# Patient Record
Sex: Female | Born: 1960 | Race: White | Hispanic: No | Marital: Married | Smoking: Never smoker
Health system: Southern US, Community
[De-identification: ages and names within clinical notes are randomized; demographics above are authoritative.]

## PROBLEM LIST (undated history)

## (undated) DIAGNOSIS — R112 Nausea with vomiting, unspecified: Secondary | ICD-10-CM

## (undated) DIAGNOSIS — Z8669 Personal history of other diseases of the nervous system and sense organs: Secondary | ICD-10-CM

## (undated) DIAGNOSIS — D689 Coagulation defect, unspecified: Secondary | ICD-10-CM

## (undated) DIAGNOSIS — D219 Benign neoplasm of connective and other soft tissue, unspecified: Secondary | ICD-10-CM

## (undated) DIAGNOSIS — I499 Cardiac arrhythmia, unspecified: Secondary | ICD-10-CM

## (undated) DIAGNOSIS — M329 Systemic lupus erythematosus, unspecified: Secondary | ICD-10-CM

## (undated) DIAGNOSIS — Z9889 Other specified postprocedural states: Secondary | ICD-10-CM

## (undated) DIAGNOSIS — M069 Rheumatoid arthritis, unspecified: Secondary | ICD-10-CM

## (undated) DIAGNOSIS — M79643 Pain in unspecified hand: Secondary | ICD-10-CM

## (undated) DIAGNOSIS — M359 Systemic involvement of connective tissue, unspecified: Secondary | ICD-10-CM

## (undated) DIAGNOSIS — R760 Raised antibody titer: Secondary | ICD-10-CM

## (undated) HISTORY — PX: ABDOMINAL HYSTERECTOMY: SHX81

## (undated) HISTORY — DX: Other specified postprocedural states: R11.2

## (undated) HISTORY — DX: Personal history of other diseases of the nervous system and sense organs: Z86.69

## (undated) HISTORY — DX: Cardiac arrhythmia, unspecified: I49.9

## (undated) HISTORY — PX: REDUCTION MAMMAPLASTY: SUR839

## (undated) HISTORY — DX: Other specified postprocedural states: Z98.890

## (undated) HISTORY — DX: Benign neoplasm of connective and other soft tissue, unspecified: D21.9

## (undated) HISTORY — PX: BUTTOCK MASS EXCISION: SHX1279

## (undated) HISTORY — PX: AUGMENTATION MAMMAPLASTY: SUR837

## (undated) HISTORY — PX: PELVIC LAPAROSCOPY: SHX162

## (undated) HISTORY — DX: Pain in unspecified hand: M79.643

## (undated) HISTORY — PX: BREAST SURGERY: SHX581

## (undated) HISTORY — DX: Systemic involvement of connective tissue, unspecified: M35.9

## (undated) HISTORY — DX: Coagulation defect, unspecified: D68.9

## (undated) HISTORY — DX: Raised antibody titer: R76.0

## (undated) HISTORY — DX: Rheumatoid arthritis, unspecified: M06.9

## (undated) HISTORY — DX: Systemic lupus erythematosus, unspecified: M32.9

---

## 2007-08-21 ENCOUNTER — Encounter: Admission: RE | Admit: 2007-08-21 | Discharge: 2007-08-21 | Payer: Self-pay | Admitting: Obstetrics and Gynecology

## 2007-08-21 IMAGING — MG MM DIAGNOSTIC BILATERAL
9 series · 9 of 9 positions shown · non-contrast
Comparison: none

DG DIAGNOSTIC BILATERAL
Bilateral CC and MLO view(s) were taken.

BILATERAL BREAST ULTRASOUND
Technologist: [facility], Medical
DIGITAL BILATERAL DIAGNOSTIC MAMMOGRAM WITH CAD AND BILATERAL BREAST ULTRASOUND:
CLINICAL DATA: Lateral left breast pain.

[R CC]
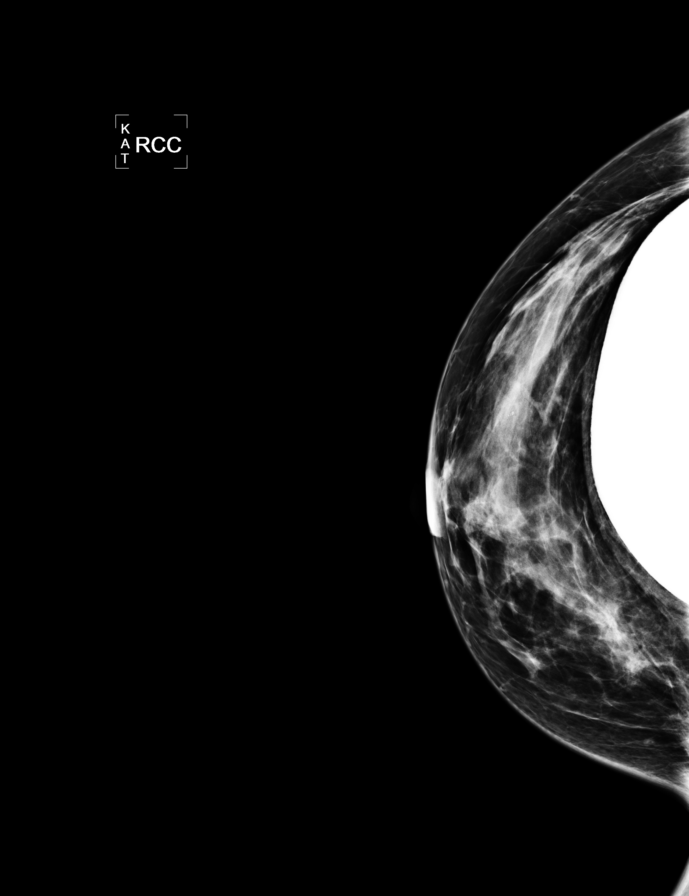

[L CC]
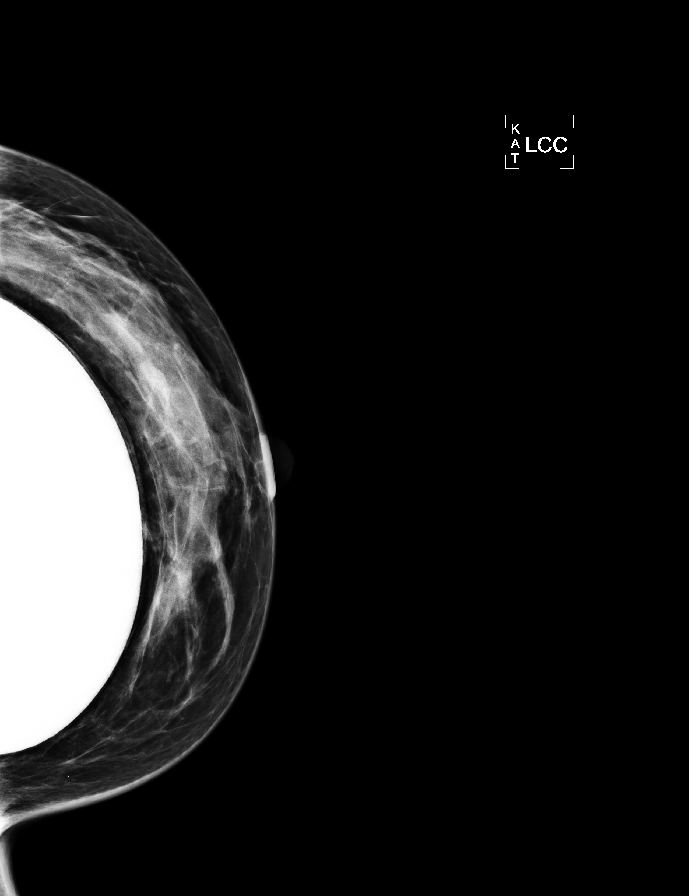

[L MLO]
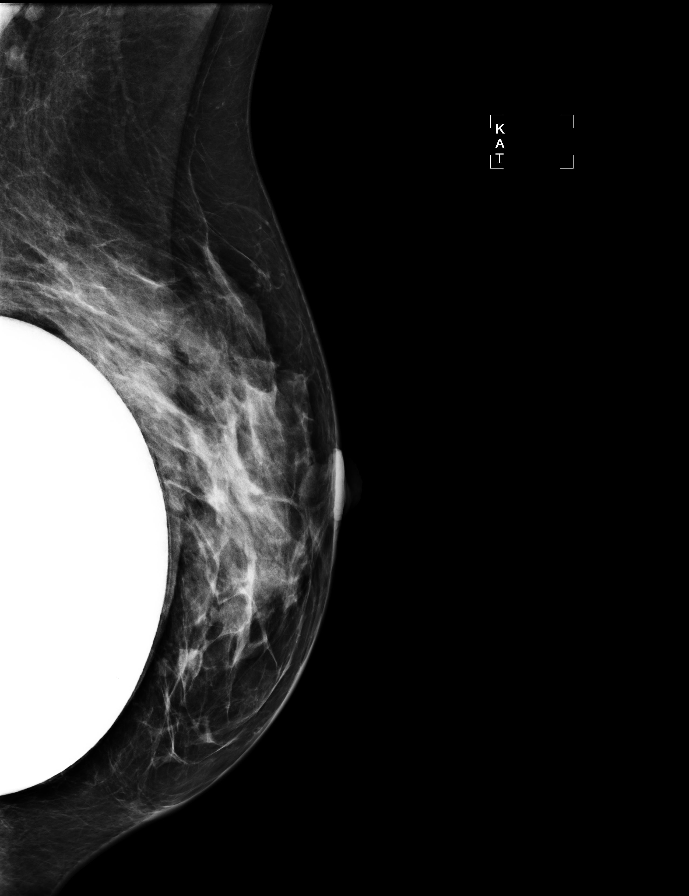

[R MLO]
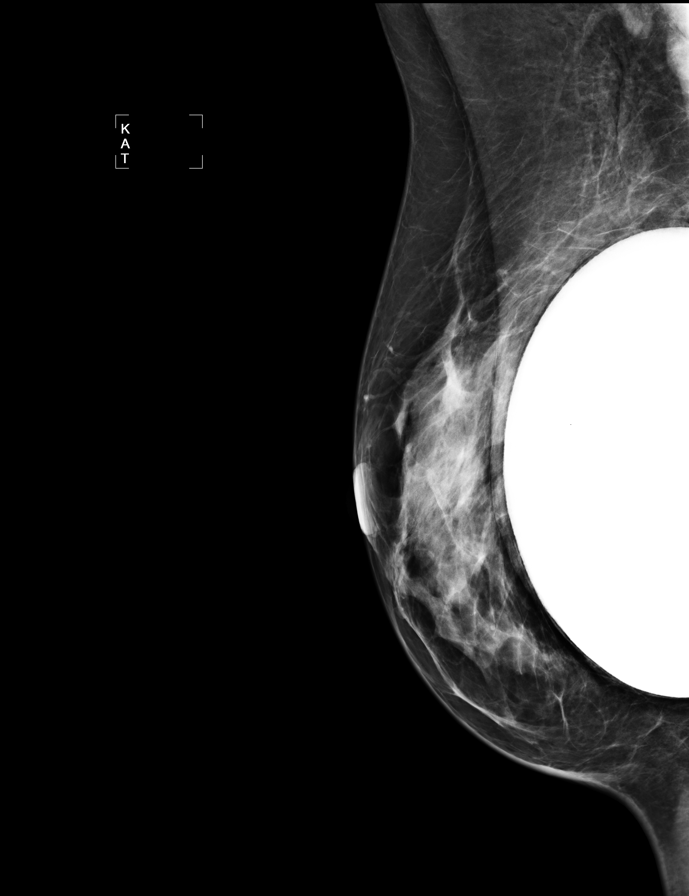

[R CCID]
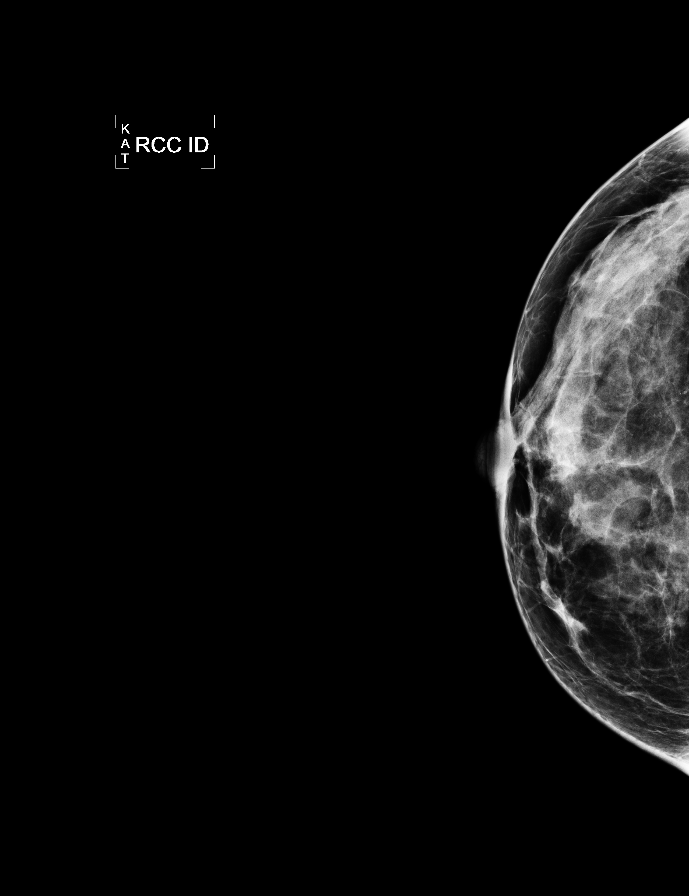

[R MLOID]
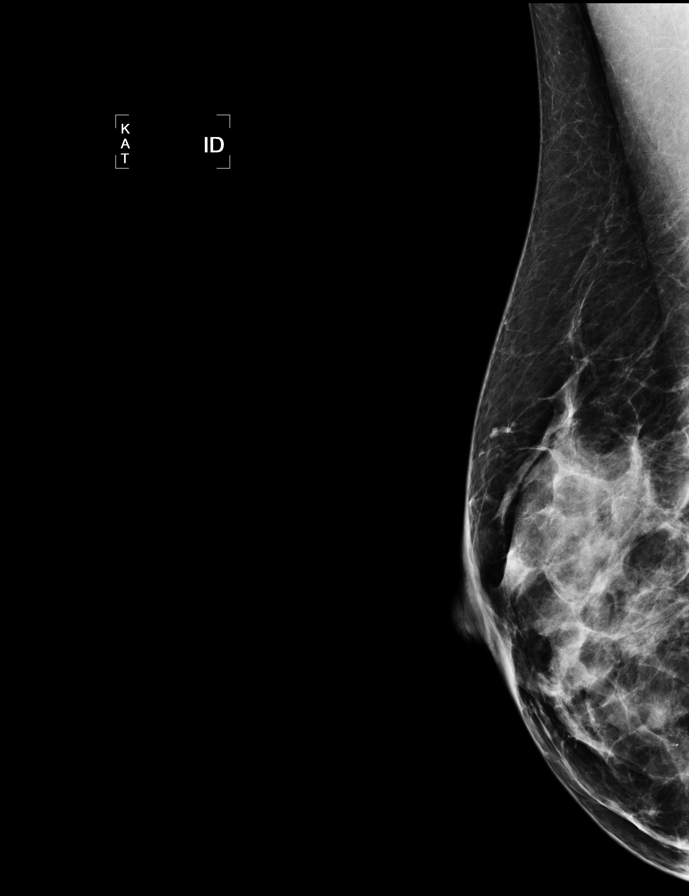

[L TAN]
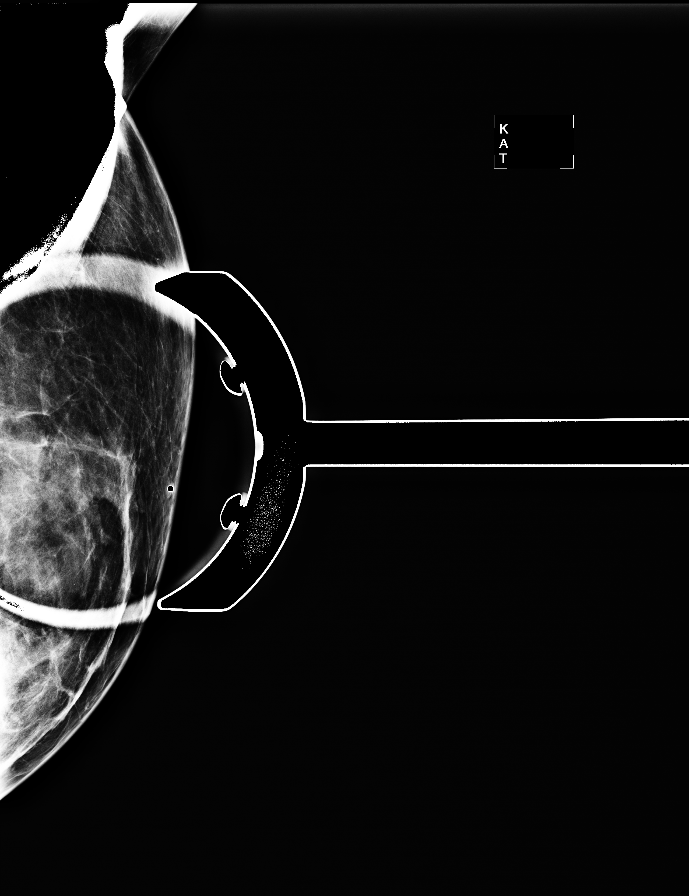

[L CCID]
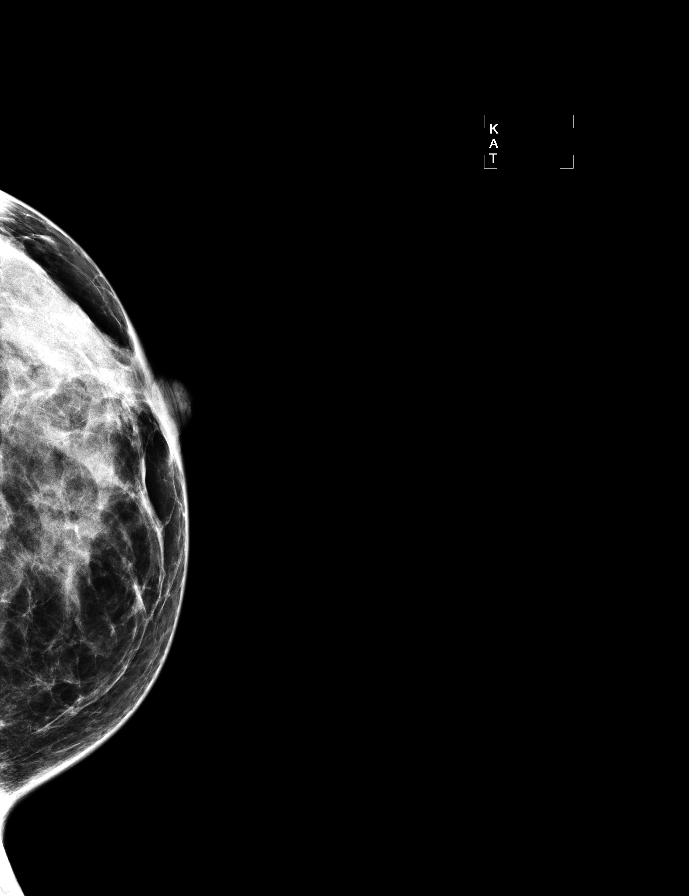

[L MLOID]
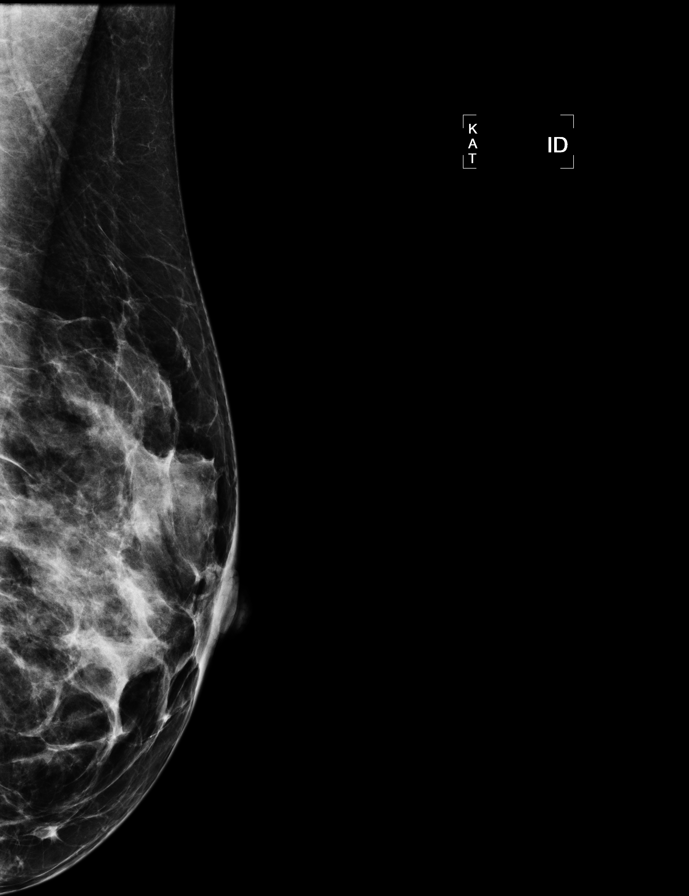

[9 of 9 positions shown; findings below may reference images not displayed]

Comparison study is from [facility], dated [DATE].  The dense fibroglandular tissue 
pattern is generally symmetric with slight prominence of medial tissue on the right non-push-back 
CC view. No dominant masses,  suspicious calcifications, or areas of architectural distortion are 
identified.

On physical exam today, I cannot palpate a discrete lump within either breast.  Ultrasound of the 
medial aspect of the right breast is normal.  Ultrasound of the lateral left breast in the region 
of pain is normal, as well.

Subglandular implants are unchanged in appearance.
IMPRESSION: There is no specific radiographic evidence of malignancy bilaterally.  Screening mammogram in one 
year is recommended.

ASSESSMENT: Benign - BI-RADS 2

Routine screening mammogram in 1 year.
,

## 2008-01-04 ENCOUNTER — Ambulatory Visit: Payer: Self-pay | Admitting: Internal Medicine

## 2008-01-04 DIAGNOSIS — R1084 Generalized abdominal pain: Secondary | ICD-10-CM | POA: Insufficient documentation

## 2008-01-04 DIAGNOSIS — K625 Hemorrhage of anus and rectum: Secondary | ICD-10-CM

## 2008-03-18 ENCOUNTER — Ambulatory Visit: Payer: Self-pay | Admitting: Internal Medicine

## 2008-03-28 ENCOUNTER — Ambulatory Visit: Payer: Self-pay | Admitting: Internal Medicine

## 2008-03-28 ENCOUNTER — Encounter: Payer: Self-pay | Admitting: Internal Medicine

## 2008-03-28 HISTORY — PX: COLONOSCOPY: SHX174

## 2008-03-30 ENCOUNTER — Encounter: Payer: Self-pay | Admitting: Internal Medicine

## 2008-04-04 IMAGING — CT CT UROGRAM
2 of 3 series · 15 of 42 positions shown, 20 images · non-contrast
Comparison: NONE

CLINICAL DATA: Right-sided flank pain and hematuria. 

CT UROGRAM
TECHNIQUE: Thin section unenhanced axial images were obtained to 
provide a CT urogram to evaluate for possible urinary tract stone. 
 Scan thicknesses were 3 mm with 3 mm increments.

[Series 2: wo · axial · 0.64mm/px · z∈[+702,+1050]mm · 12 of 130 slices shown, 16 images]
[im 7/130  soft-tissue]
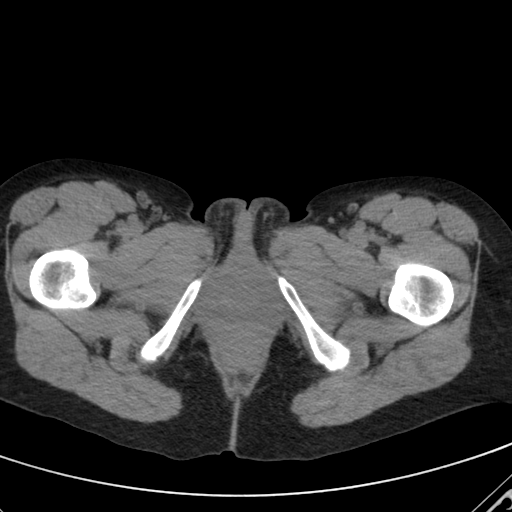
[im 7/130  bone]
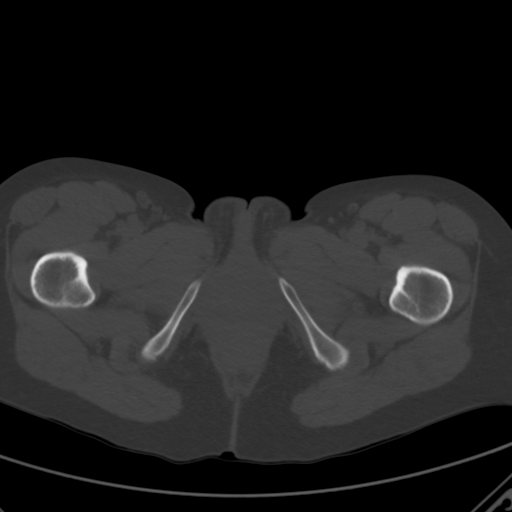
[im 21/130  soft-tissue]
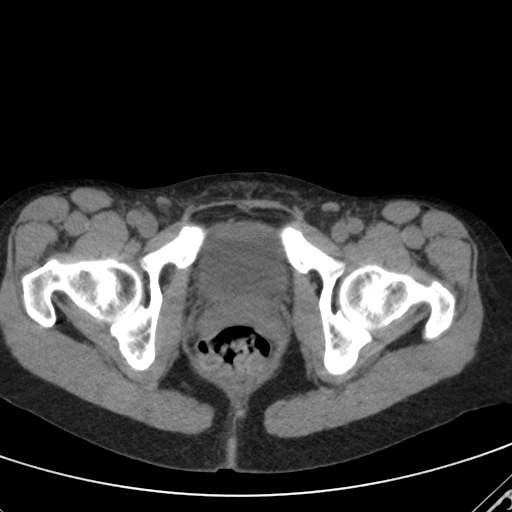
[im 34/130  soft-tissue]
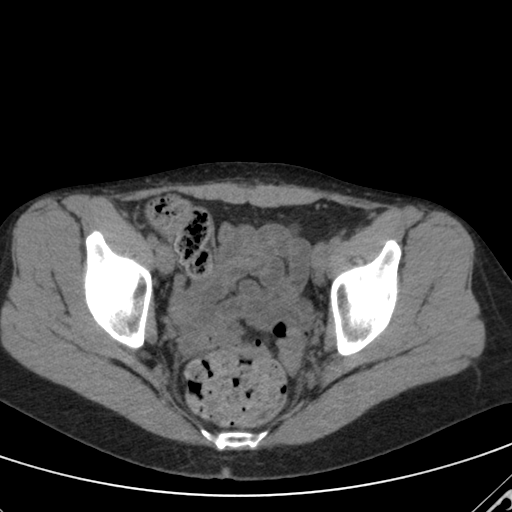
[im 48/130  soft-tissue]
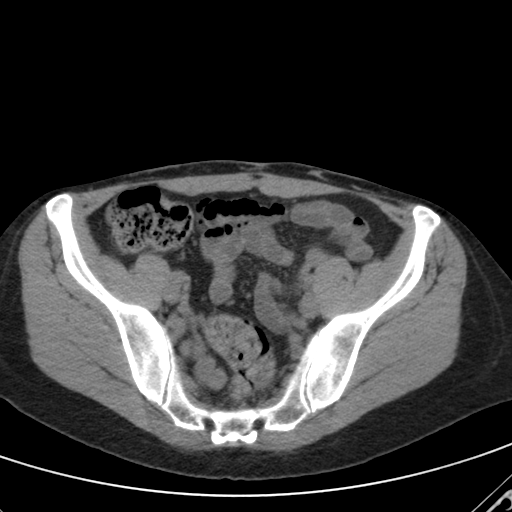
[im 62/130  soft-tissue]
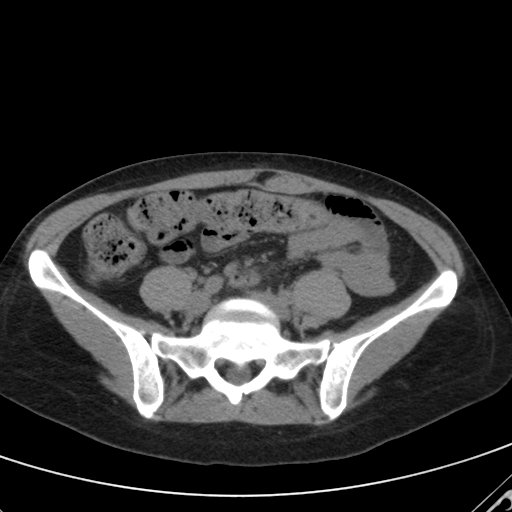
[im 68/130  soft-tissue]
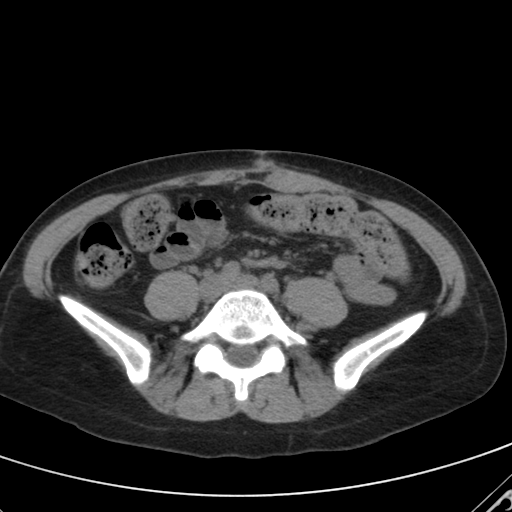
[im 82/130  soft-tissue]
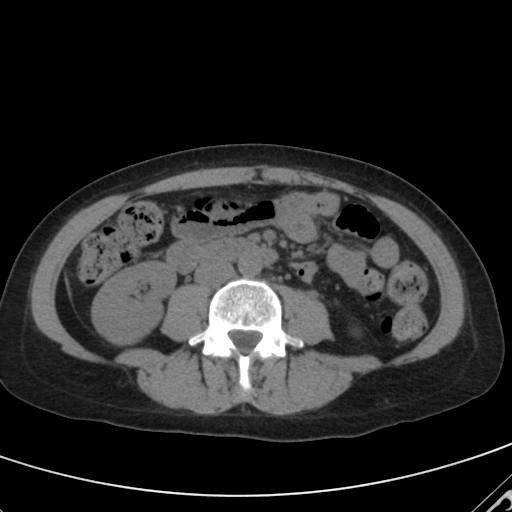
[im 96/130  soft-tissue]
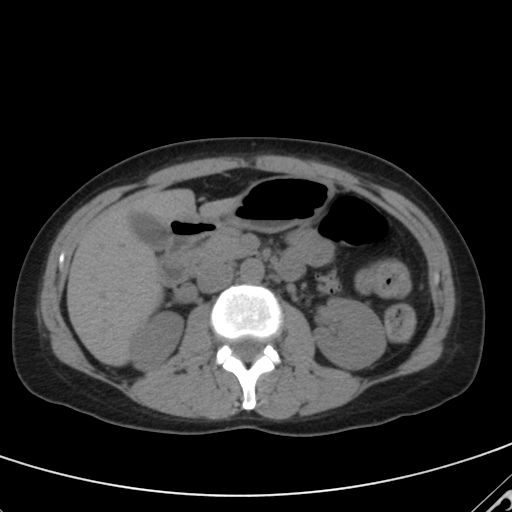
[im 102/130  lung]
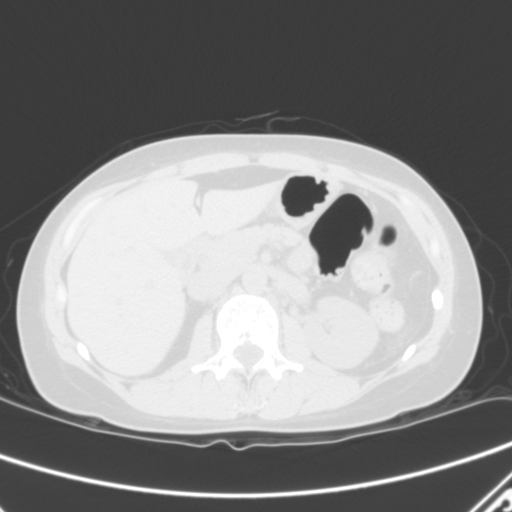
[im 109/130  soft-tissue]
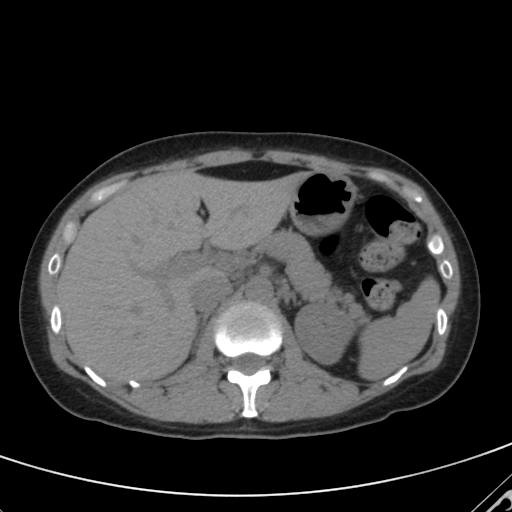
[im 109/130  lung]
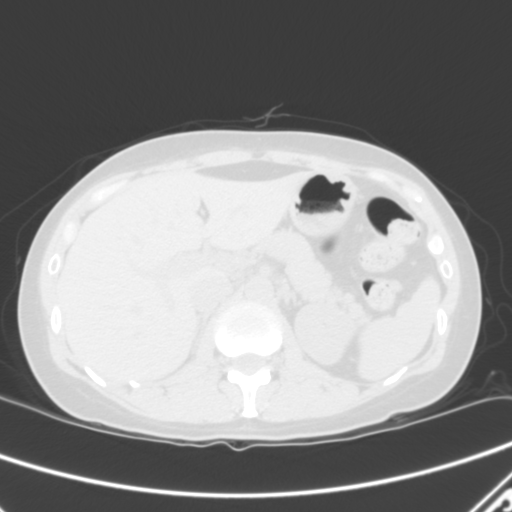
[im 109/130  bone]
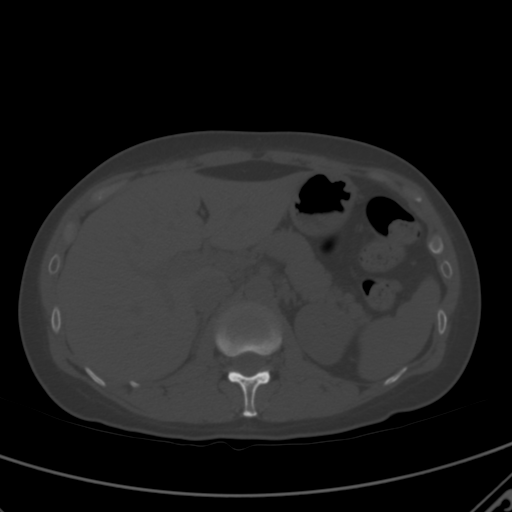
[im 116/130  lung]
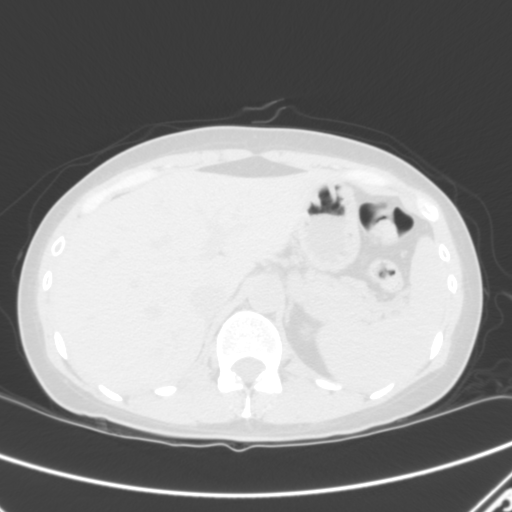
[im 123/130  soft-tissue]
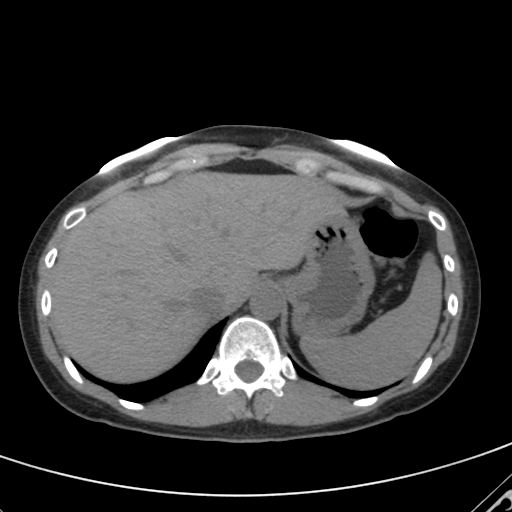
[im 123/130  lung]
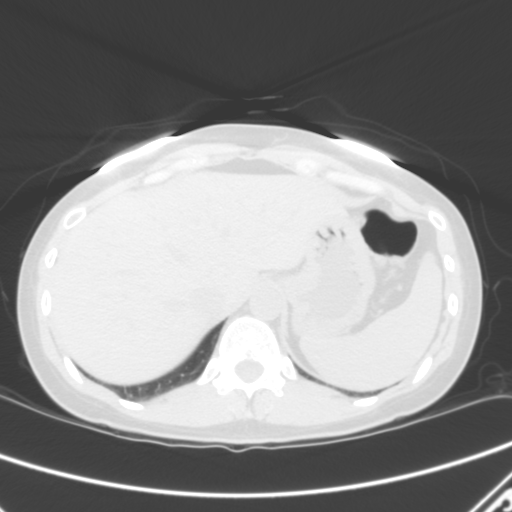

[coronals · coronal · 0.75mm/px · 3 of 63 slices shown, 4 images]
[im 21/63  soft-tissue]
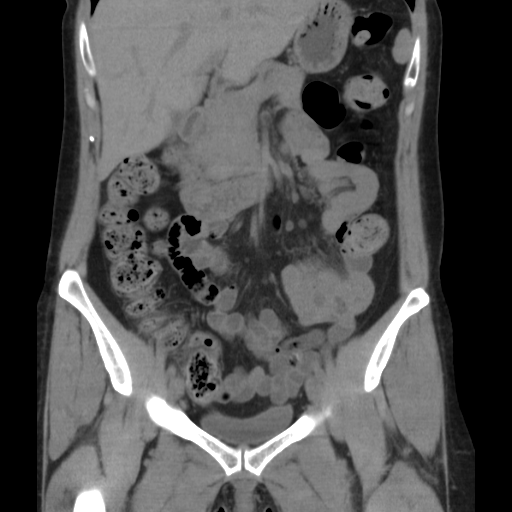
[im 28/63  soft-tissue]
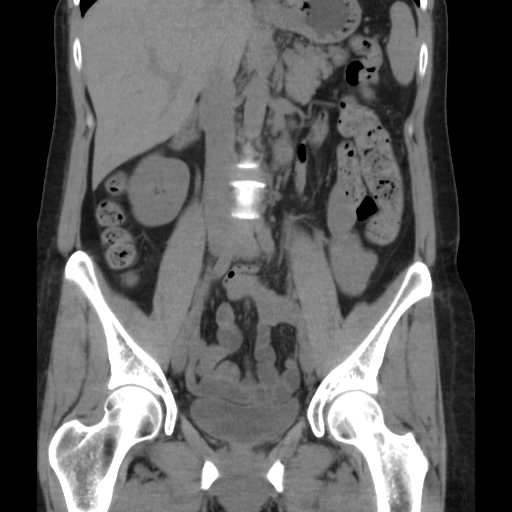
[im 28/63  bone]
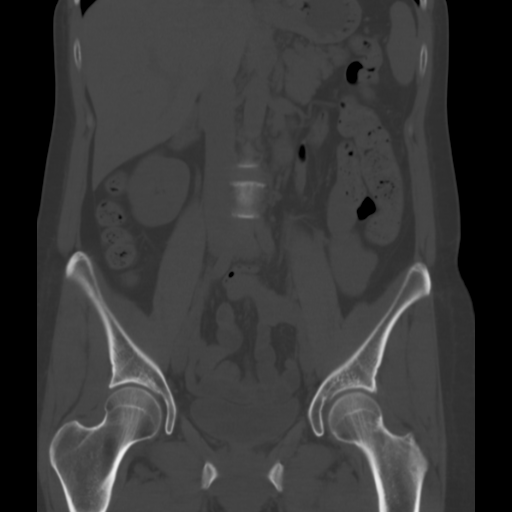
[im 35/63  soft-tissue]
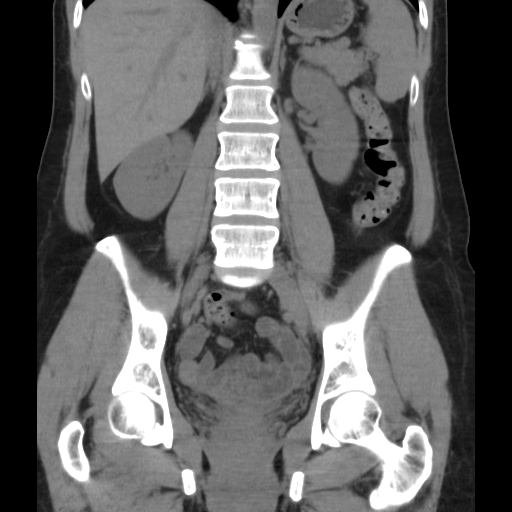

[15 of 42 positions shown; findings below may reference images not displayed]

FINDINGS: No gallstones are identified.  No renal 
or ureteral calculi.  No evidence of intrarenal or ureteral 
obstruction. No mass, adenopathy, or inflammatory process is noted 
in the abdomen or pelvis.  No evidence of appendicitis, 
diverticulitis, hernia, or bowel obstruction. No lytic or blastic 
lesions are identified.
IMPRESSION: Negative CT urogram. [facility] 
electronically reviewed on [DATE] Dict Date: [DATE]  Tran 
Date:  [DATE] DAS  [REDACTED]

## 2008-06-10 ENCOUNTER — Other Ambulatory Visit: Admission: RE | Admit: 2008-06-10 | Discharge: 2008-06-10 | Payer: Self-pay | Admitting: Obstetrics & Gynecology

## 2008-10-31 ENCOUNTER — Telehealth: Payer: Self-pay | Admitting: Internal Medicine

## 2008-11-01 ENCOUNTER — Ambulatory Visit: Payer: Self-pay | Admitting: Gastroenterology

## 2008-11-01 DIAGNOSIS — K648 Other hemorrhoids: Secondary | ICD-10-CM | POA: Insufficient documentation

## 2008-11-01 DIAGNOSIS — K644 Residual hemorrhoidal skin tags: Secondary | ICD-10-CM | POA: Insufficient documentation

## 2008-11-01 DIAGNOSIS — Z860100 Personal history of colon polyps, unspecified: Secondary | ICD-10-CM | POA: Insufficient documentation

## 2008-11-02 LAB — CONVERTED CEMR LAB
Albumin: 4.3 g/dL (ref 3.5–5.2)
Alkaline Phosphatase: 40 units/L (ref 39–117)
Calcium: 9.5 mg/dL (ref 8.4–10.5)
HCT: 38.5 % (ref 36.0–46.0)
Hemoglobin: 13.5 g/dL (ref 12.0–15.0)
MCHC: 35.1 g/dL (ref 30.0–36.0)
Monocytes Absolute: 0.4 10*3/uL (ref 0.1–1.0)
Monocytes Relative: 5.9 % (ref 3.0–12.0)
Neutro Abs: 3.9 10*3/uL (ref 1.4–7.7)
Platelets: 213 10*3/uL (ref 150.0–400.0)
Potassium: 3.9 meq/L (ref 3.5–5.1)
RBC: 4.53 M/uL (ref 3.87–5.11)
RDW: 12.1 % (ref 11.5–14.6)
Sodium: 142 meq/L (ref 135–145)
Total Protein: 6.7 g/dL (ref 6.0–8.3)

## 2008-11-29 ENCOUNTER — Ambulatory Visit: Payer: Self-pay | Admitting: Internal Medicine

## 2008-11-29 DIAGNOSIS — R143 Flatulence: Secondary | ICD-10-CM

## 2008-11-29 DIAGNOSIS — R142 Eructation: Secondary | ICD-10-CM

## 2008-11-29 DIAGNOSIS — R141 Gas pain: Secondary | ICD-10-CM | POA: Insufficient documentation

## 2009-05-22 ENCOUNTER — Encounter: Admission: RE | Admit: 2009-05-22 | Discharge: 2009-05-22 | Payer: Self-pay | Admitting: Obstetrics & Gynecology

## 2009-05-22 IMAGING — MG MM DIGITAL SCREENING W/ IMPLANTS
8 series · 8 of 8 positions shown · non-contrast
Comparison: none

DG SCREENING W/IMPLANTS
Bilateral CC and MLO view(s) were taken.

DIGITAL SCREENING MAMMOGRAM WITH CAD:
There are scattered fibroglandular densities.  A possible mass is noted in the right breast.  Spot 
compression views and possibly sonography are recommended for further evaluation.  In the left 
breast, no masses or malignant type calcifications are identified.  Compared with prior studies.
Images were processed with CAD.

[R CC]
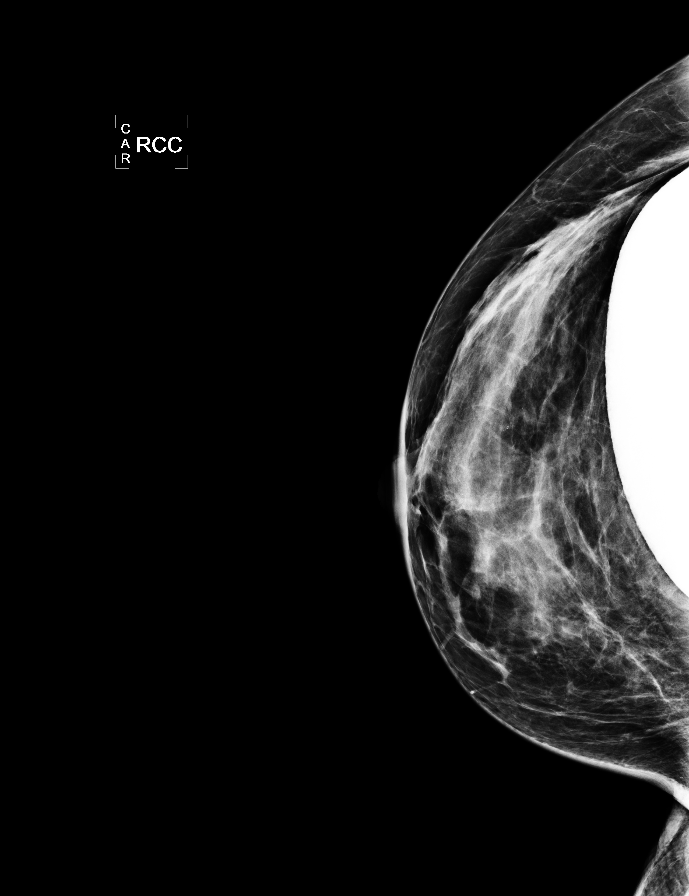

[L CC]
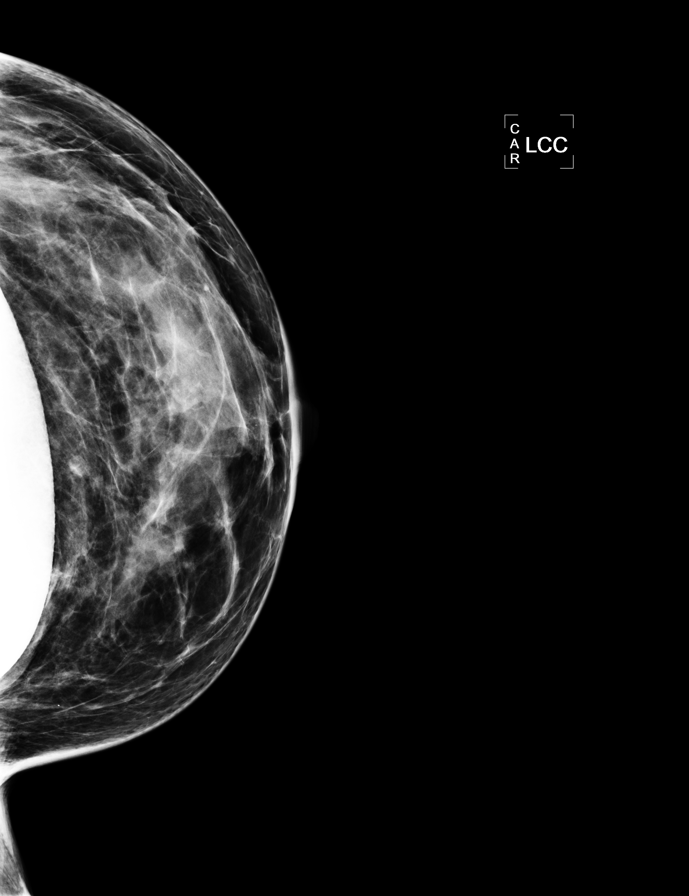

[L MLO]
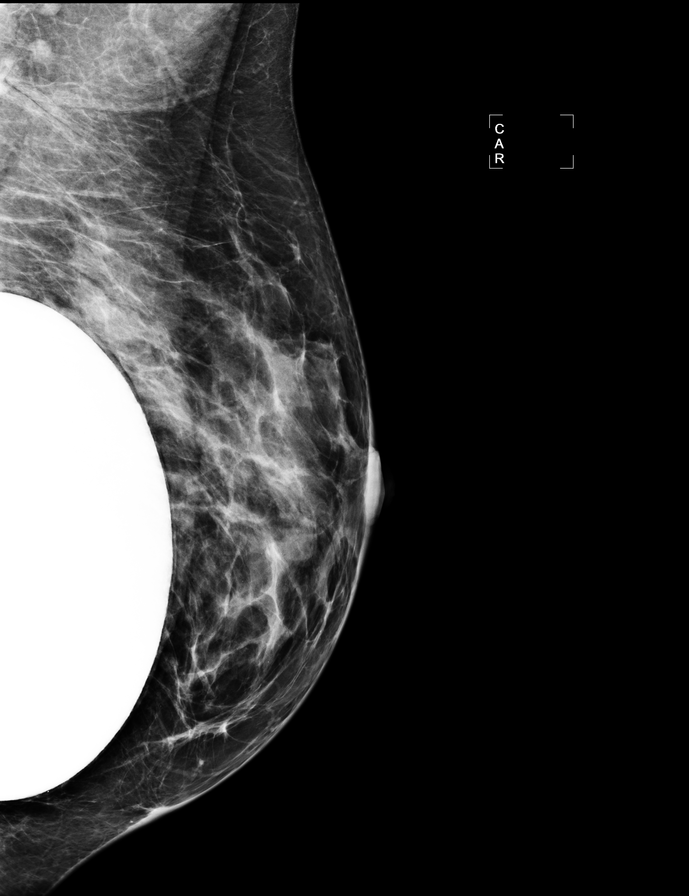

[R MLO]
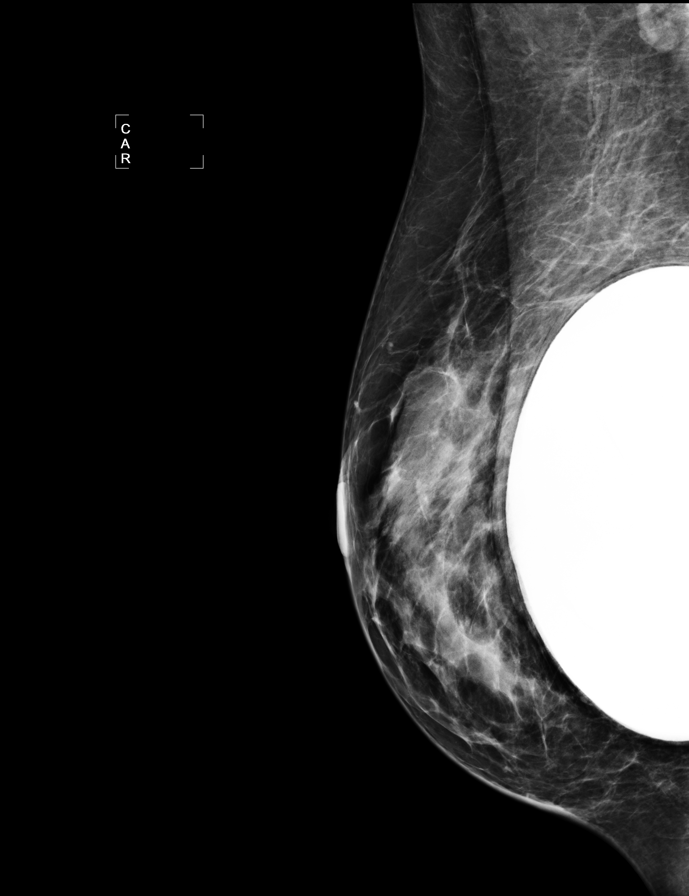

[R CCID]
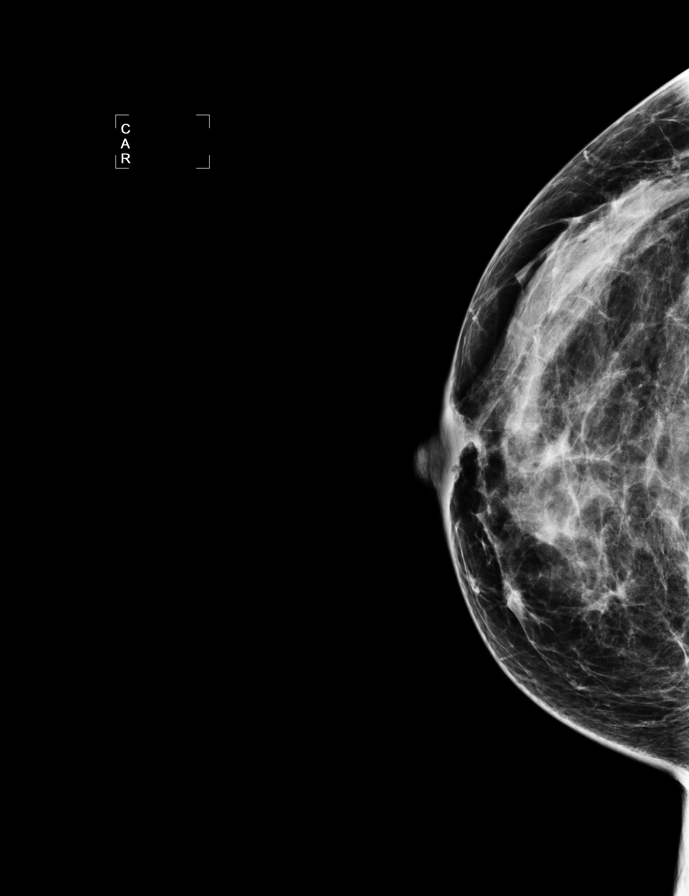

[L CCID]
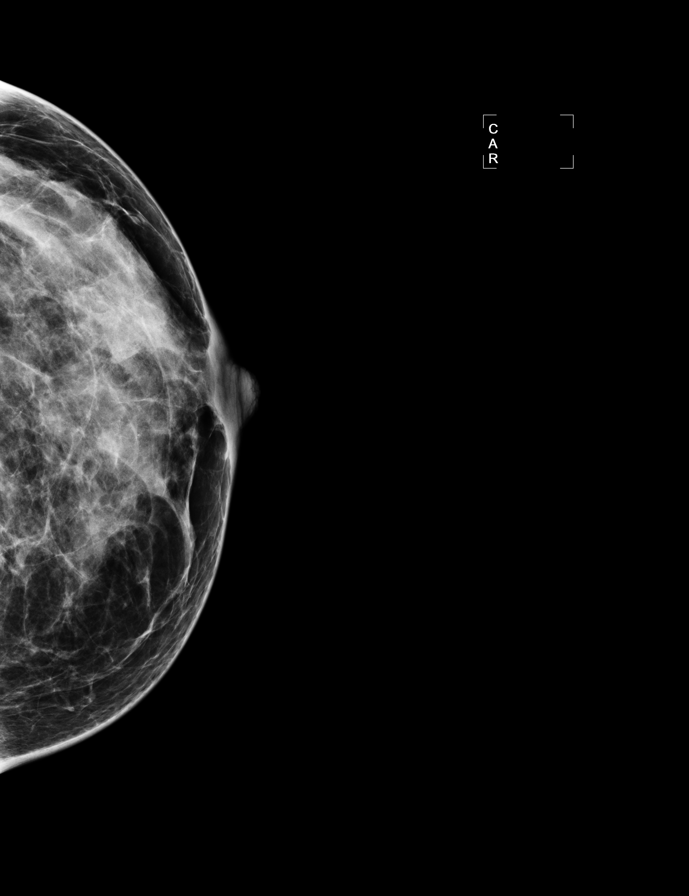

[L MLOID]
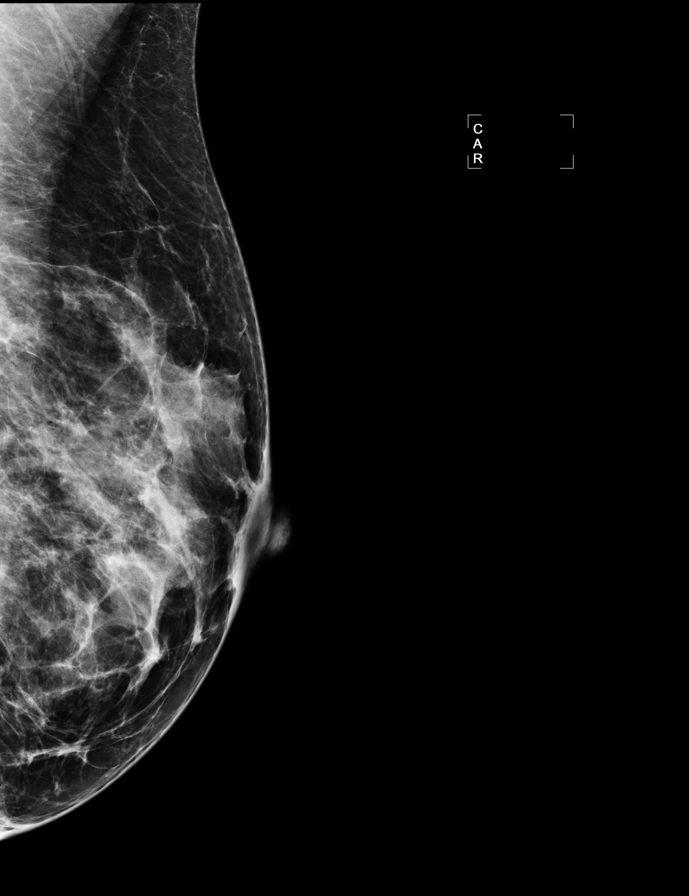

[R MLOID]
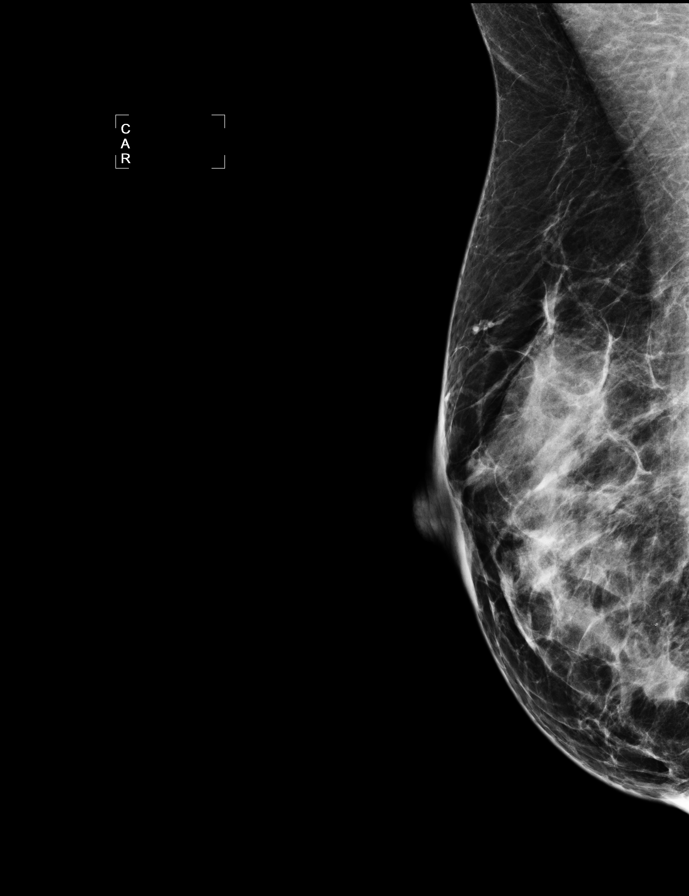

[8 of 8 positions shown; findings below may reference images not displayed]

IMPRESSION: Possible mass, right breast.  Additional evaluation is indicated.  The patient will be contacted 
for additional studies and a supplementary report will follow.  No specific mammographic evidence 
of malignancy, left breast.

ASSESSMENT: Need additional imaging evaluation and/or prior mammograms for comparison - BI-RADS 0

Further imaging of the right breast.
,

## 2009-05-26 ENCOUNTER — Encounter: Admission: RE | Admit: 2009-05-26 | Discharge: 2009-05-26 | Payer: Self-pay | Admitting: Obstetrics & Gynecology

## 2009-05-26 IMAGING — MG MM DIGITAL DIAG LTD R
2 series · 2 of 2 positions shown · non-contrast
Comparison: [DATE]

CLINICAL DATA: Further evaluation of right asymmetry seen on
screening mammogram

DIGITAL DIAGNOSTIC RIGHT MAMMOGRAM

[R ML]
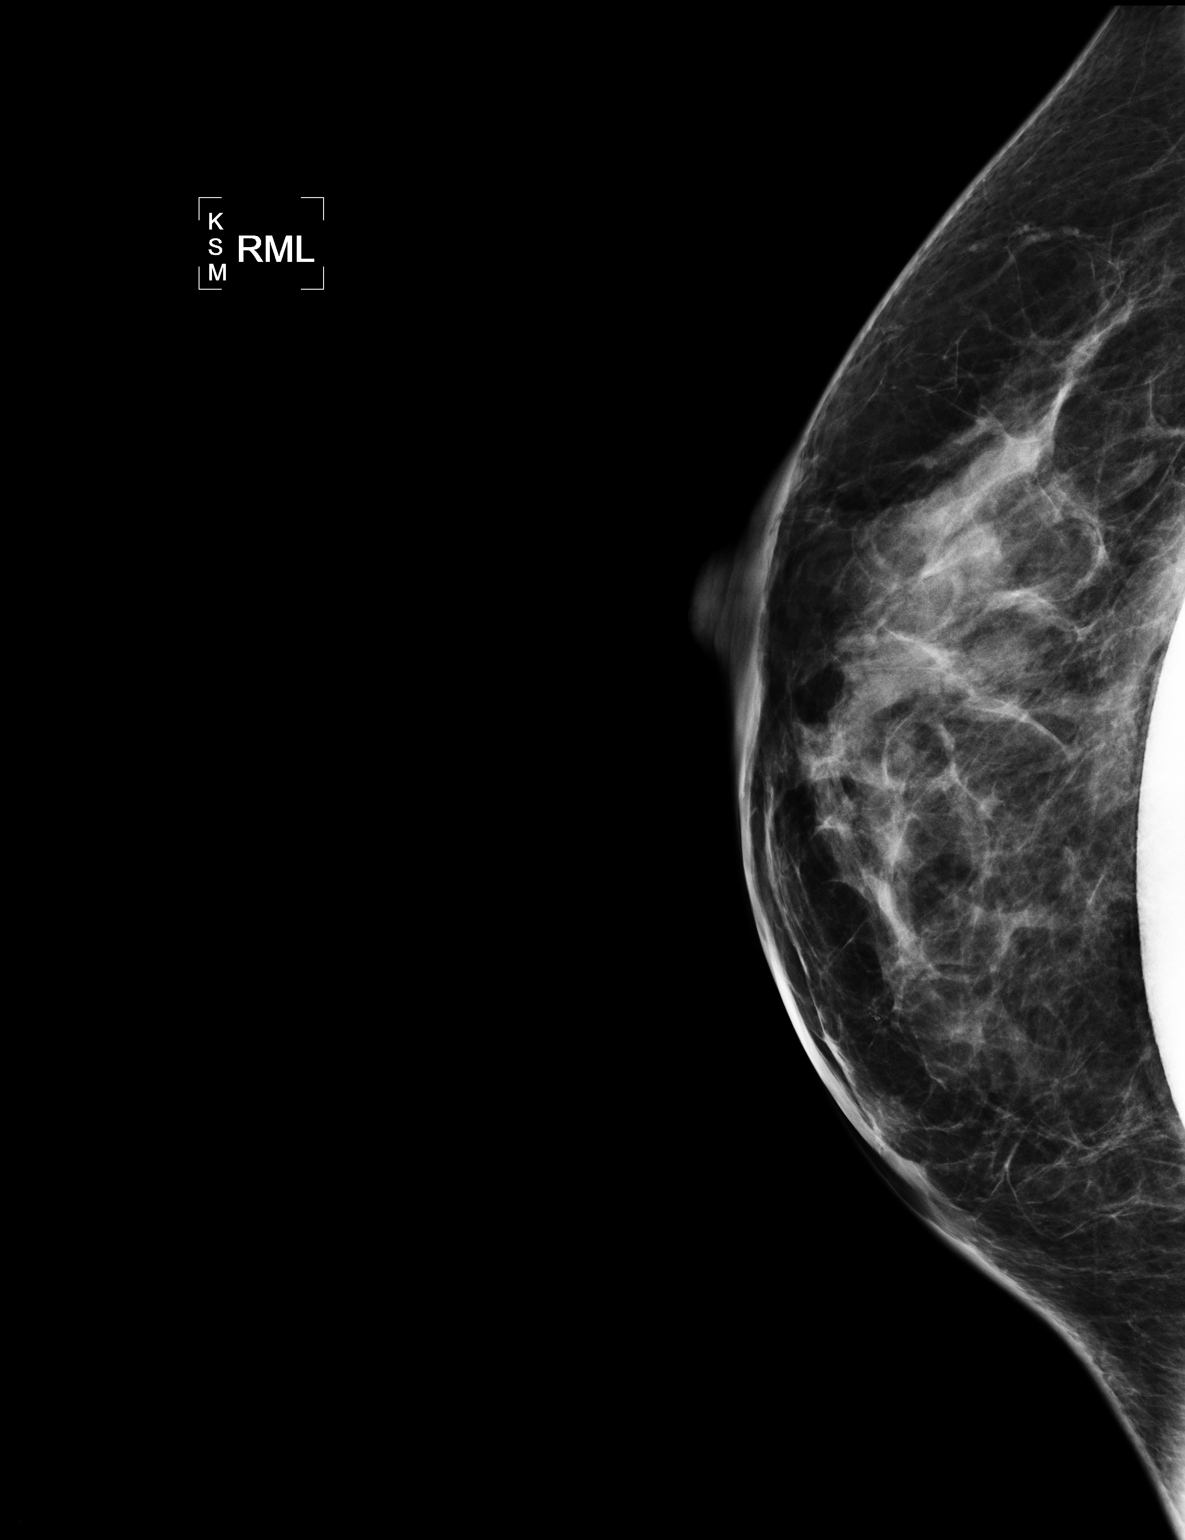

[R MLOID]
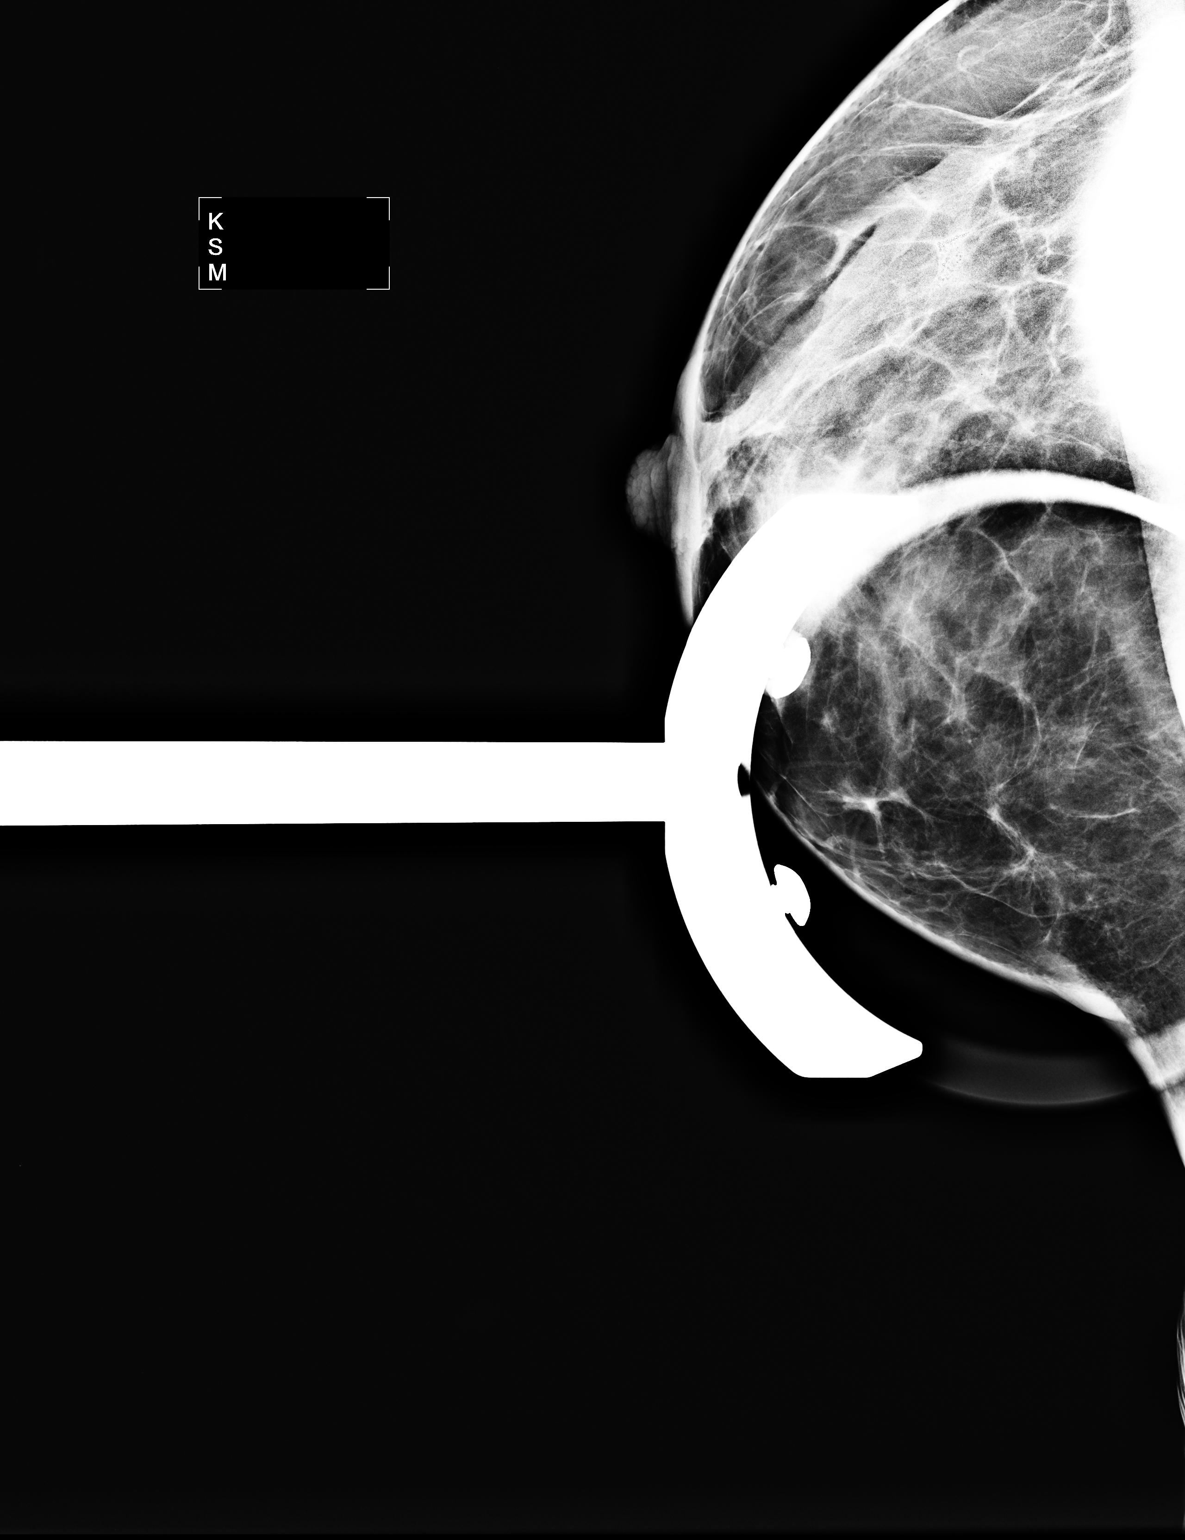

[2 of 2 positions shown; findings below may reference images not displayed]

FINDINGS: No persistent abnormalities on spot compression or true
lateral view.
IMPRESSION: Negative

BI-RADS CATEGORY 1:  Negative.

Recommendation:

Return to annual screening mammography.

## 2010-03-24 LAB — HM COLONOSCOPY

## 2010-06-04 ENCOUNTER — Encounter
Admission: RE | Admit: 2010-06-04 | Discharge: 2010-06-04 | Payer: Self-pay | Source: Home / Self Care | Attending: Obstetrics & Gynecology | Admitting: Obstetrics & Gynecology

## 2010-06-04 IMAGING — MG MM DIGITAL SCREENING W/ IMPLANTS
8 series · 8 of 8 positions shown · non-contrast
Comparison: none

DG SCREENING W/IMPLANTS
Bilateral CC and MLO view(s) were taken.

DIGITAL SCREENING MAMMOGRAM W/IMPLANTS WITH CAD:
Implants are present in a subpectoral location.  Standard and modified compression views are 
obtained.
There are scattered fibroglandular densities.  No dominant masses or malignant type calcifications 
are identified.  Compared with prior studies.
Images were processed with CAD.

[R CC]
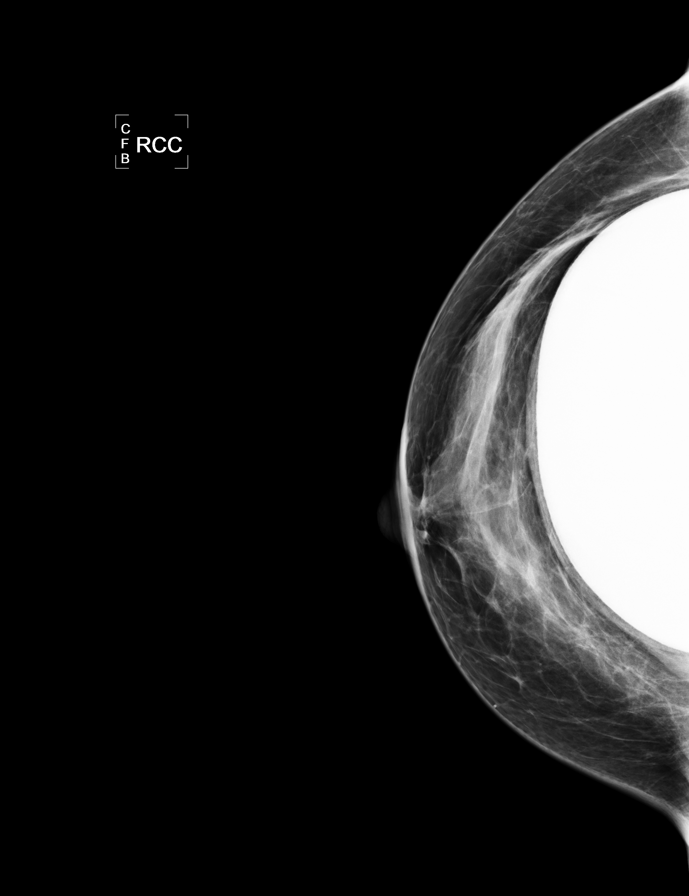

[L CC]
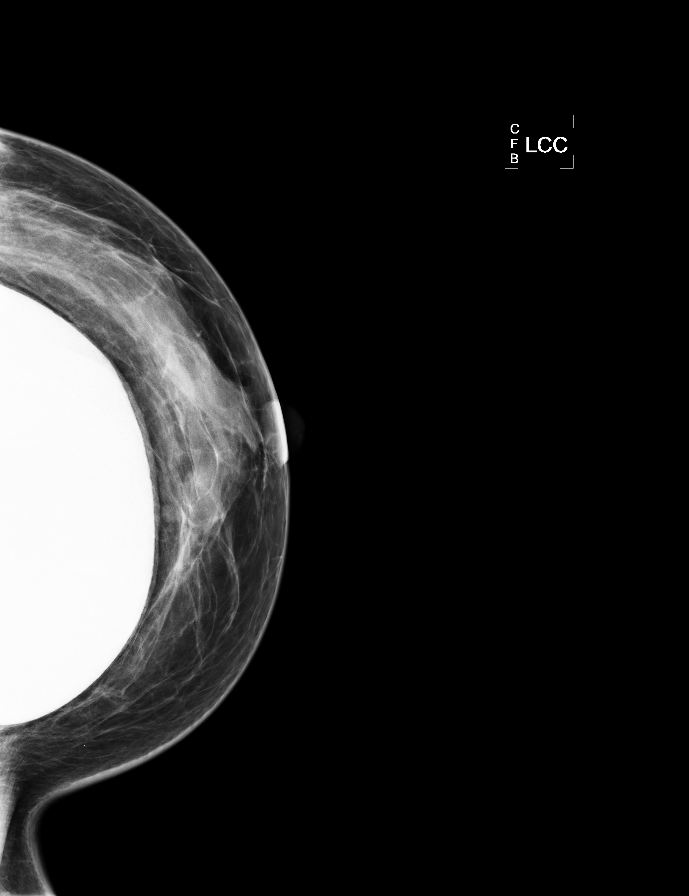

[L MLO]
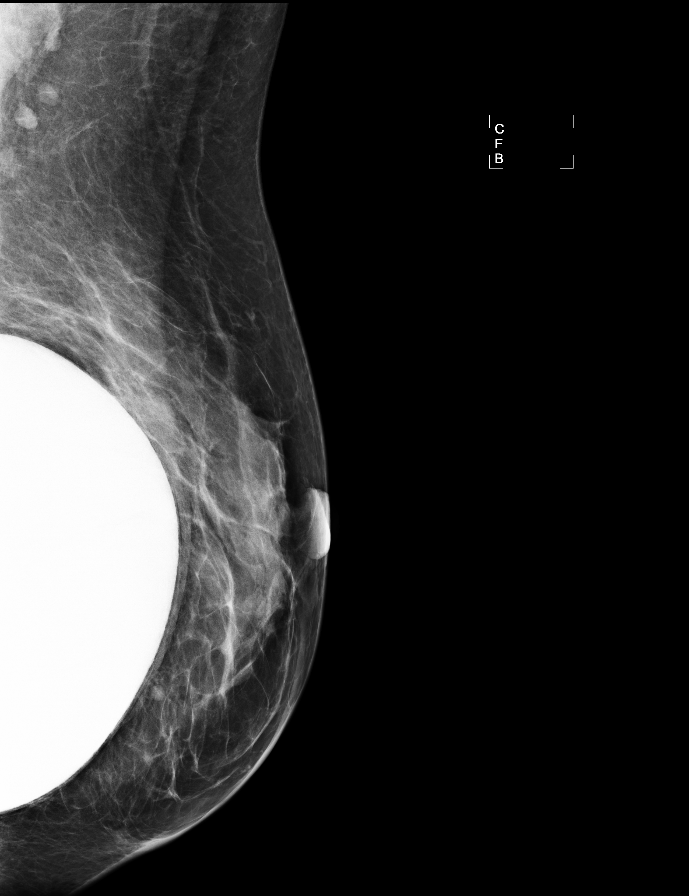

[R MLO]
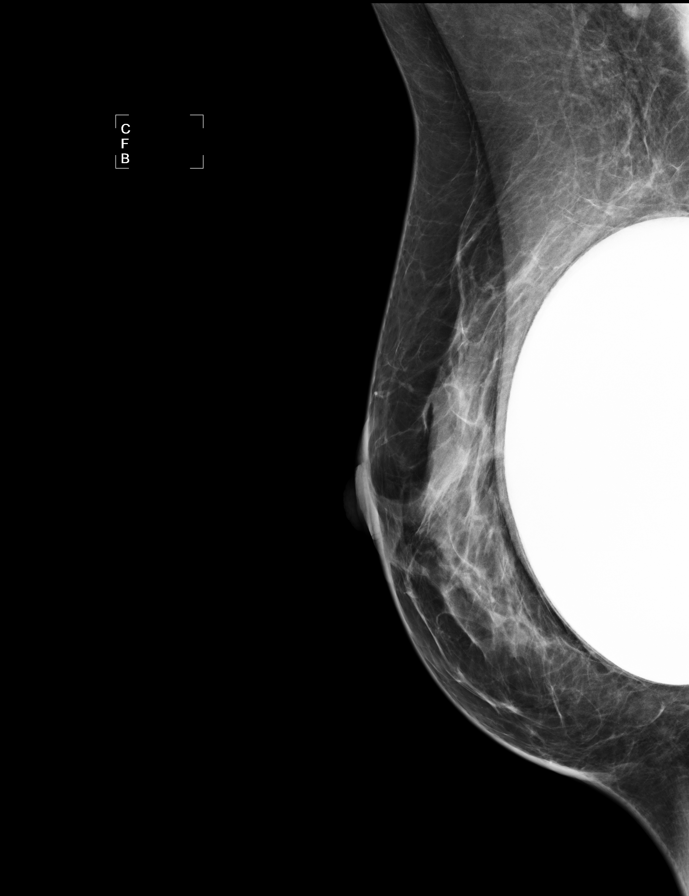

[R CCID]
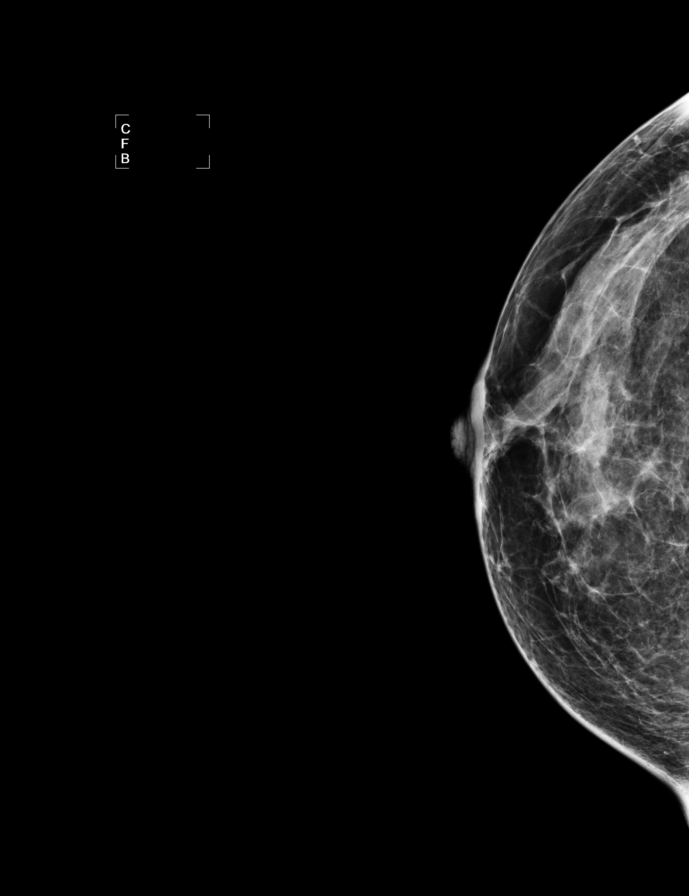

[L CCID]
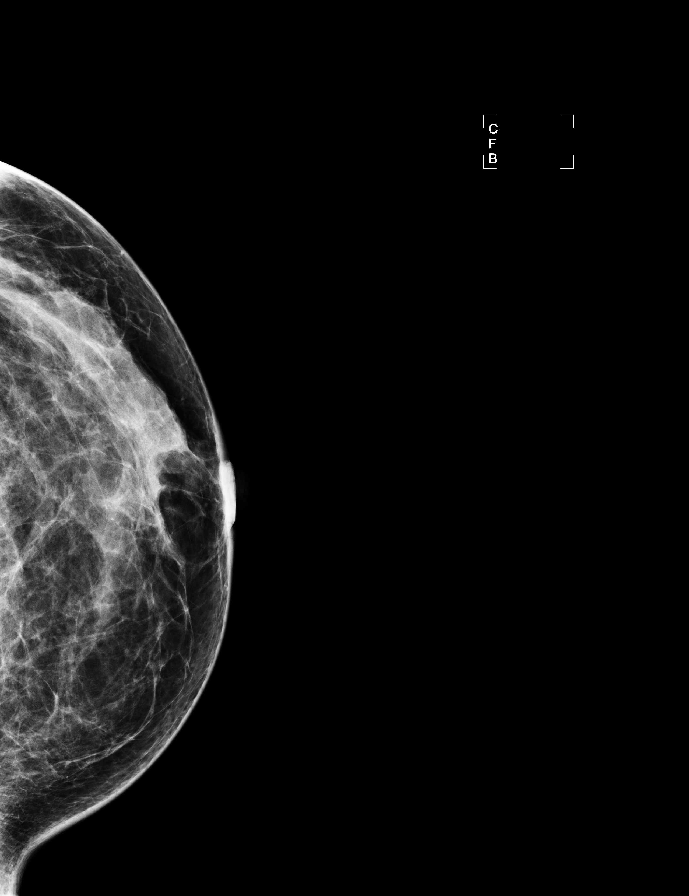

[L MLOID]
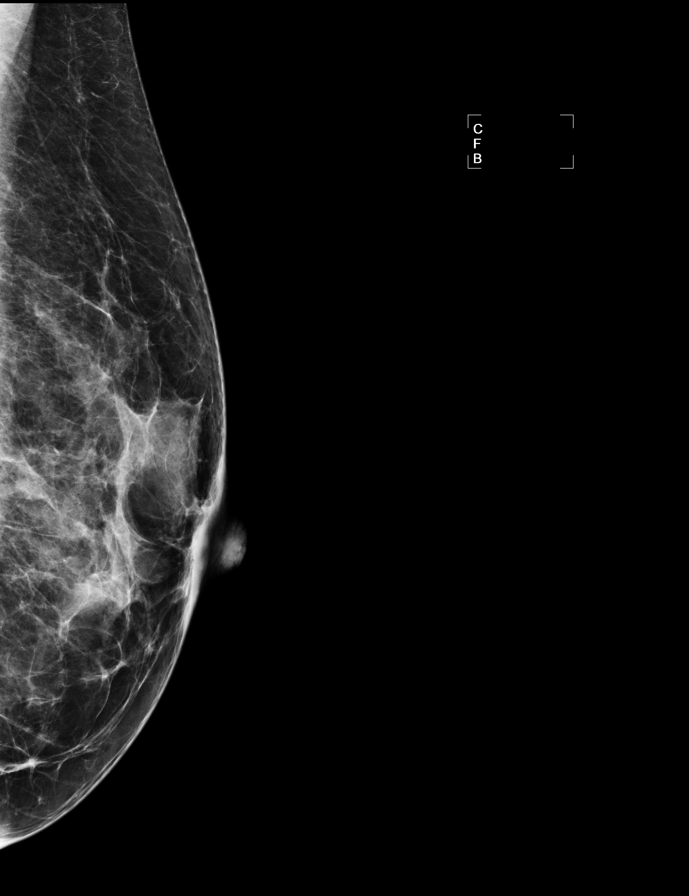

[R MLOID]
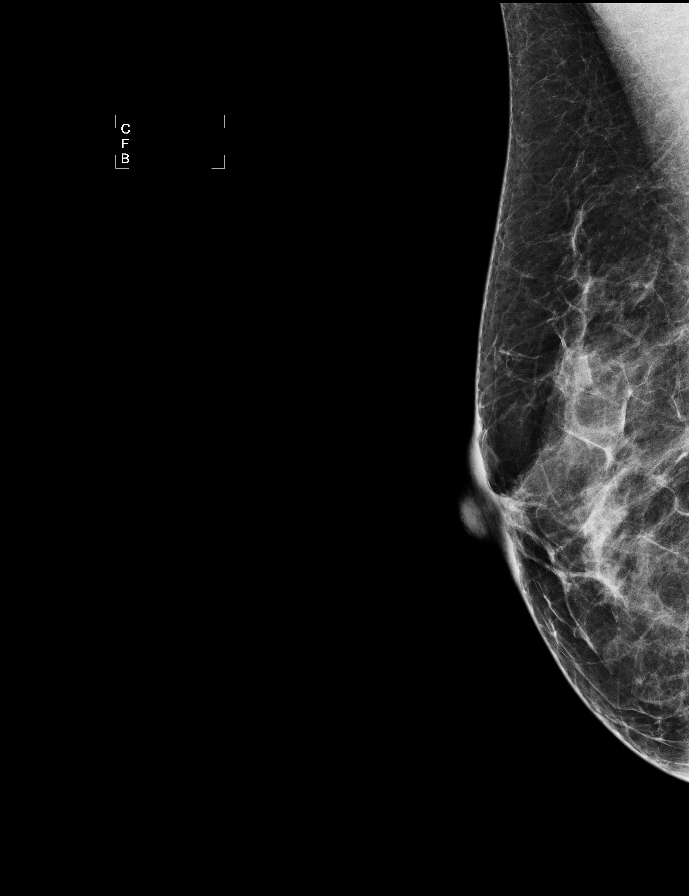

[8 of 8 positions shown; findings below may reference images not displayed]

IMPRESSION: No specific mammographic evidence of malignancy.  Next screening mammogram is recommended in one 
year.

A result letter of this screening mammogram will be mailed directly to the patient.

ASSESSMENT: Negative - BI-RADS 1

Screening mammogram in 1 year.
,

## 2010-07-15 ENCOUNTER — Encounter: Payer: Self-pay | Admitting: Obstetrics & Gynecology

## 2010-11-29 ENCOUNTER — Other Ambulatory Visit: Payer: Self-pay | Admitting: Dermatology

## 2011-09-19 ENCOUNTER — Other Ambulatory Visit: Payer: Self-pay | Admitting: Obstetrics & Gynecology

## 2011-09-19 DIAGNOSIS — Z1231 Encounter for screening mammogram for malignant neoplasm of breast: Secondary | ICD-10-CM

## 2011-09-25 ENCOUNTER — Other Ambulatory Visit: Payer: Self-pay | Admitting: Cardiology

## 2011-09-25 DIAGNOSIS — I83819 Varicose veins of unspecified lower extremities with pain: Secondary | ICD-10-CM

## 2011-10-08 ENCOUNTER — Other Ambulatory Visit: Payer: Self-pay

## 2011-10-16 ENCOUNTER — Ambulatory Visit
Admission: RE | Admit: 2011-10-16 | Discharge: 2011-10-16 | Disposition: A | Discharge status: Home / Self Care | Payer: Managed Care, Other (non HMO) | Source: Ambulatory Visit | Attending: Cardiology | Admitting: Cardiology

## 2011-10-16 DIAGNOSIS — I83819 Varicose veins of unspecified lower extremities with pain: Secondary | ICD-10-CM

## 2011-10-24 ENCOUNTER — Ambulatory Visit
Admission: RE | Admit: 2011-10-24 | Discharge: 2011-10-24 | Disposition: A | Discharge status: Home / Self Care | Payer: Managed Care, Other (non HMO) | Source: Ambulatory Visit | Attending: Obstetrics & Gynecology | Admitting: Obstetrics & Gynecology

## 2011-10-24 DIAGNOSIS — Z1231 Encounter for screening mammogram for malignant neoplasm of breast: Secondary | ICD-10-CM

## 2011-10-24 LAB — HM MAMMOGRAPHY: HM Mammogram: NORMAL

## 2011-11-06 ENCOUNTER — Ambulatory Visit
Admission: RE | Admit: 2011-11-06 | Discharge: 2011-11-06 | Disposition: A | Discharge status: Home / Self Care | Payer: Managed Care, Other (non HMO) | Source: Ambulatory Visit | Attending: Cardiology | Admitting: Cardiology

## 2011-11-06 IMAGING — US US EXTREM LOW VENOUS BILAT
1 series · 13 of 24 positions shown · non-contrast
Comparison: None.

CLINICAL DATA: Calf pain.  Ankle swelling.

VENOUS DUPLEX ULTRASOUND OF BILATERAL LOWER EXTREMITIES
TECHNIQUE: Gray-scale sonography with graded compression, as well
as color Doppler and duplex ultrasound, were performed to evaluate
the deep venous system of both lower extremities from the level of
the common femoral vein through the popliteal and proximal calf
veins.  Spectral Doppler was utilized to evaluate flow at rest and
with distal augmentation maneuvers.

[Series 1: us extrem low venous bilat · 13 of 60 slices shown]
[im 1/60]
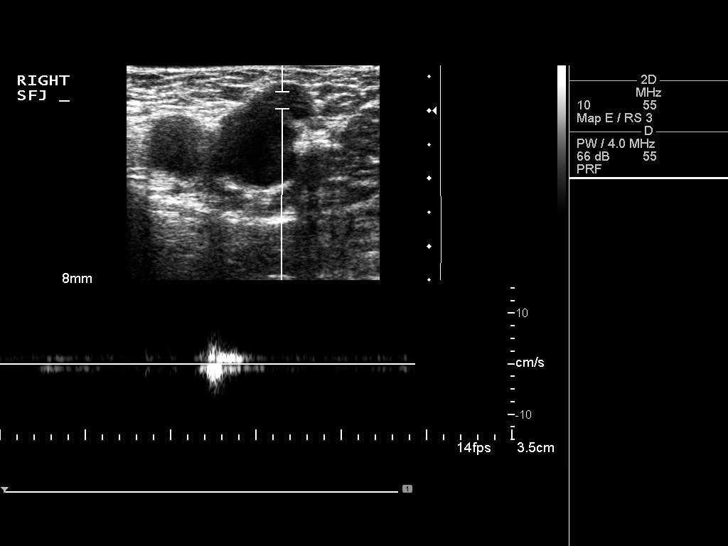
[im 6/60]
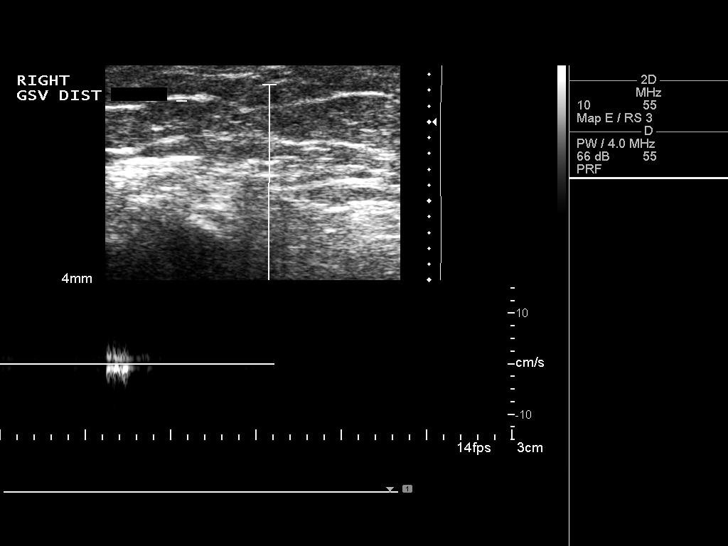
[im 11/60]
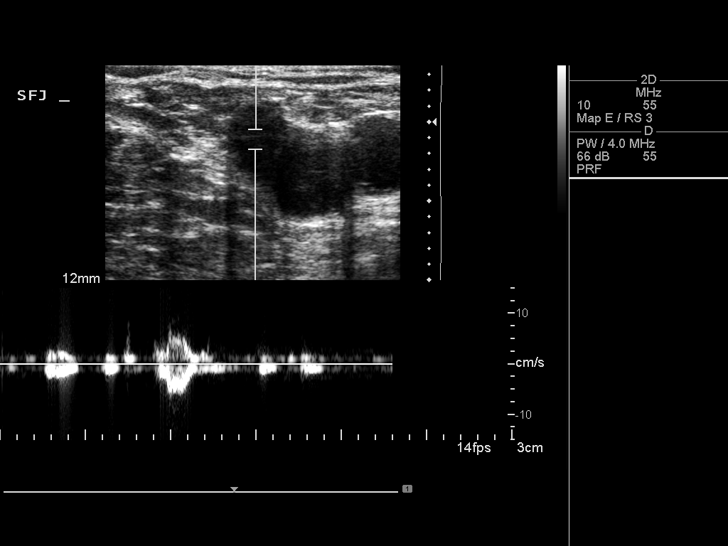
[im 16/60]
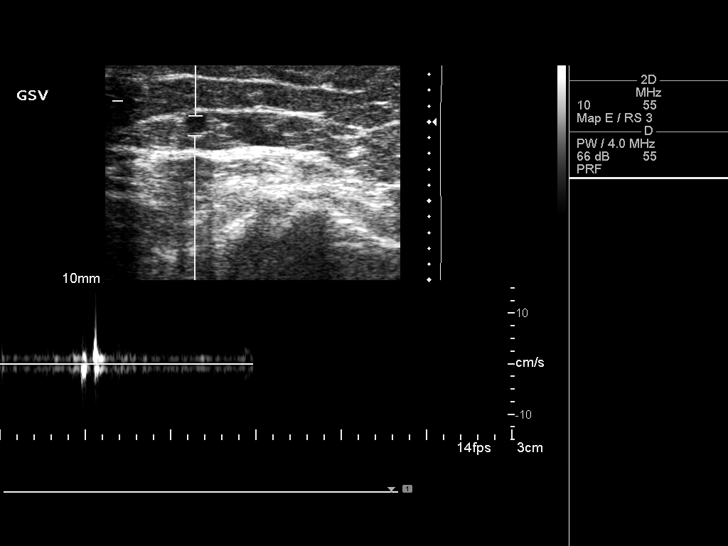
[im 21/60]
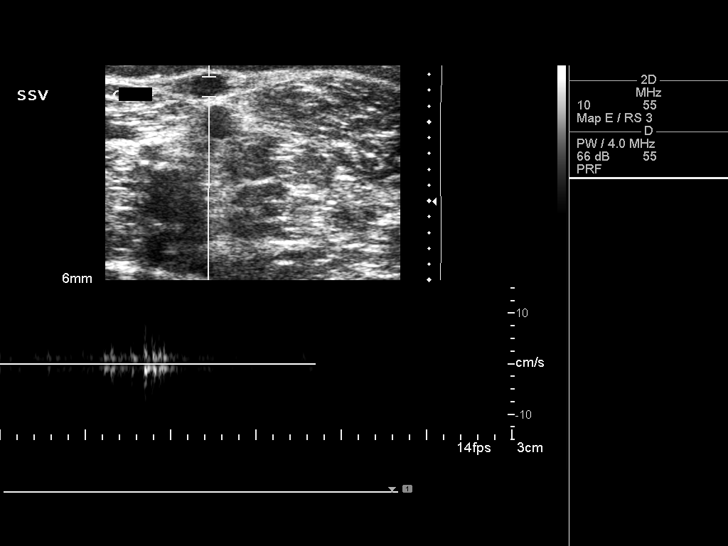
[im 26/60]
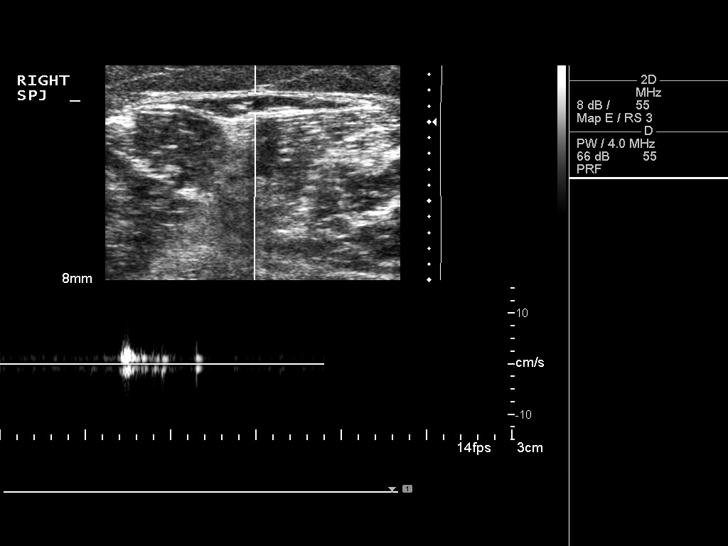
[im 31/60]
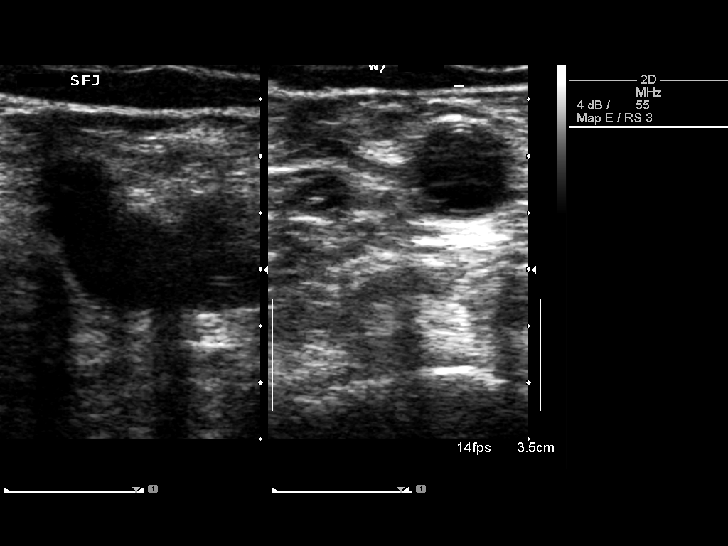
[im 34/60]
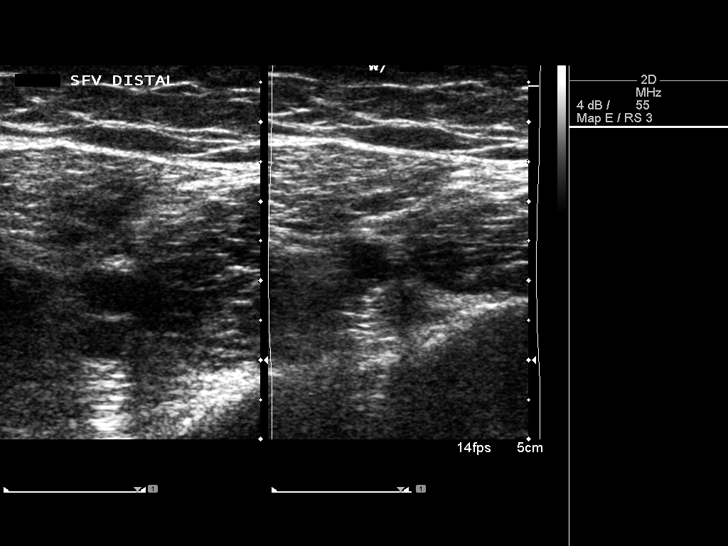
[im 39/60]
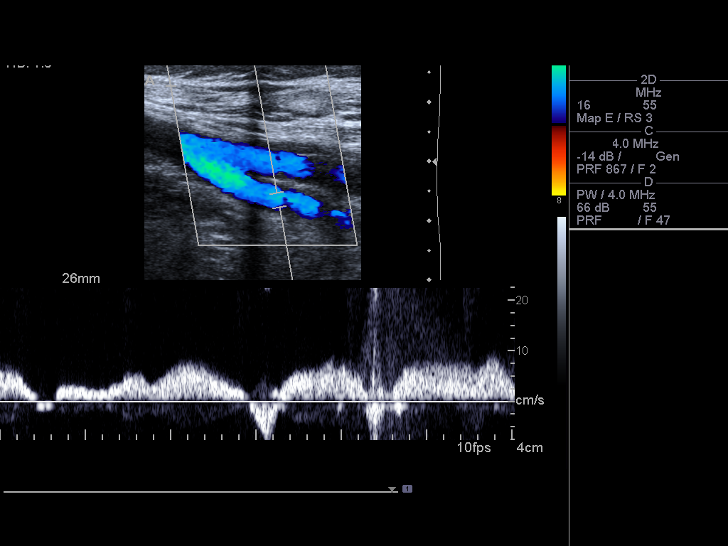
[im 44/60]
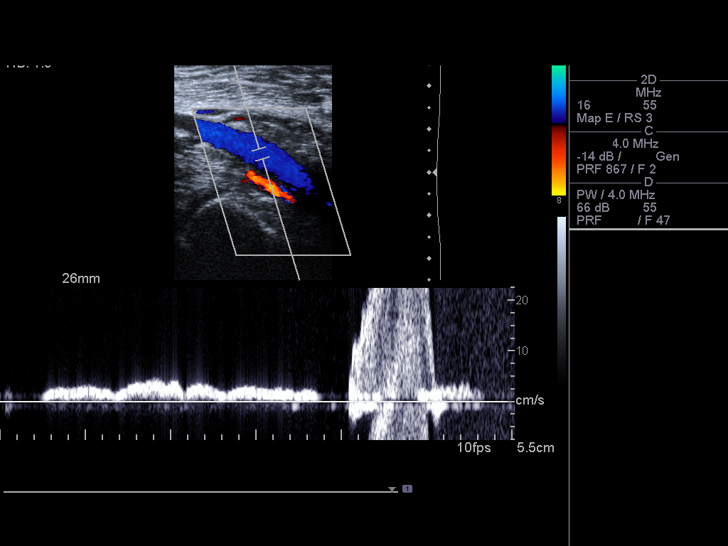
[im 49/60]
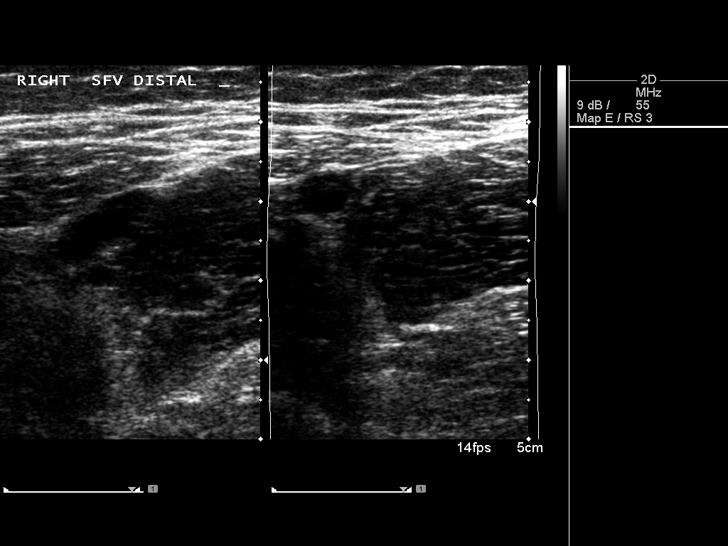
[im 54/60]
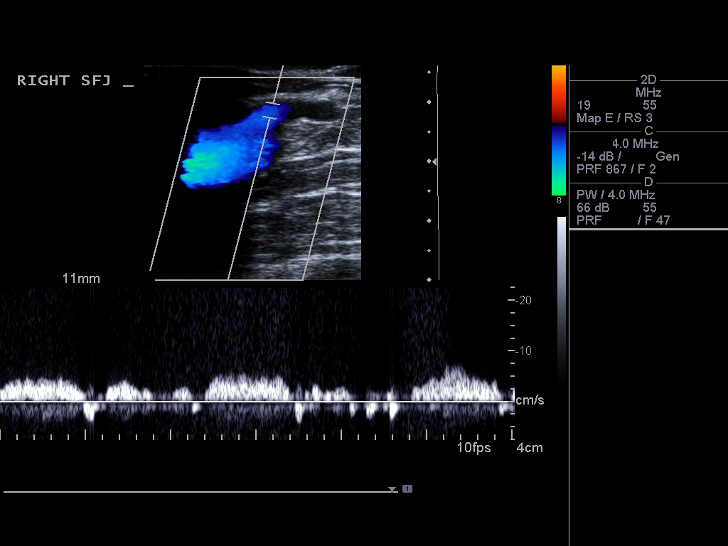
[im 60/60]
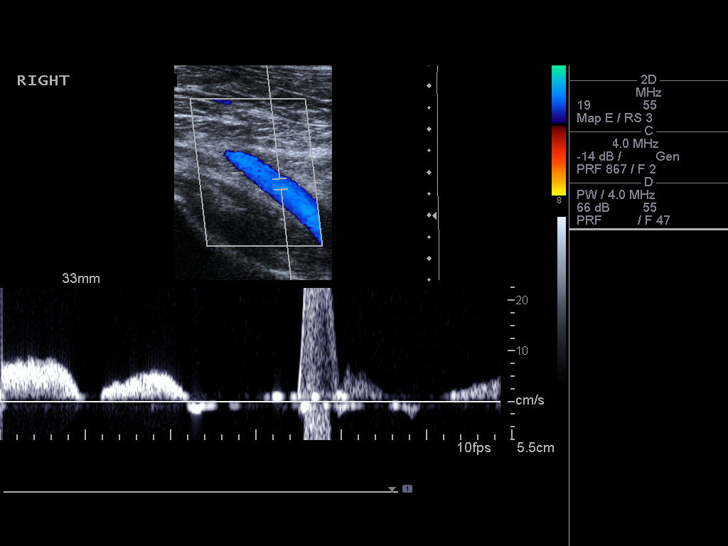

[13 of 24 positions shown; findings below may reference images not displayed]

FINDINGS: There is complete compressibility of the bilateral common
femoral, femoral, and popliteal veins.  Doppler analysis of the
deep venous system demonstrates respiratory phasicity and
augmentation of flow upon calf compression bilaterally.

Examination of the superficial venous system is as follows.  There
is no evidence of venous insufficiency in the bilateral greater
saphenous or lesser saphenous veins.  The right greater saphenous
vein system is duplicated.  No obvious varicosities are identified.
No evidence of a perforating branch in the medial mid calves
bilaterally.
IMPRESSION: No evidence of DVT.  No evidence of venous insufficiency in the
lower extremities.

## 2012-10-14 ENCOUNTER — Telehealth: Payer: Self-pay | Admitting: *Deleted

## 2012-10-14 MED ORDER — ESTROGENS CONJUGATED 0.625 MG PO TABS: 0.6250 mg | ORAL_TABLET | Freq: Every day | ORAL | Status: DC

## 2012-10-14 NOTE — Telephone Encounter (Signed)
30 days given has annual scheduled for 10/21/12,

## 2012-10-19 ENCOUNTER — Encounter: Payer: Self-pay | Admitting: *Deleted

## 2012-10-21 ENCOUNTER — Ambulatory Visit (INDEPENDENT_AMBULATORY_CARE_PROVIDER_SITE_OTHER): Payer: Managed Care, Other (non HMO) | Admitting: Obstetrics & Gynecology

## 2012-10-21 ENCOUNTER — Encounter: Payer: Self-pay | Admitting: Obstetrics & Gynecology

## 2012-10-21 ENCOUNTER — Ambulatory Visit: Payer: Self-pay | Admitting: Obstetrics & Gynecology

## 2012-10-21 ENCOUNTER — Telehealth: Payer: Self-pay | Admitting: Obstetrics and Gynecology

## 2012-10-21 VITALS — BP 100/62 | Ht 66.0 in | Wt 135.4 lb

## 2012-10-21 DIAGNOSIS — Z0000 Encounter for general adult medical examination without abnormal findings: Secondary | ICD-10-CM

## 2012-10-21 DIAGNOSIS — Z01419 Encounter for gynecological examination (general) (routine) without abnormal findings: Secondary | ICD-10-CM

## 2012-10-21 LAB — POCT URINALYSIS DIPSTICK
Bilirubin, UA: NEGATIVE
Blood, UA: NEGATIVE
Ketones, UA: NEGATIVE
Nitrite, UA: NEGATIVE
Urobilinogen, UA: NEGATIVE

## 2012-10-21 MED ORDER — ESTROGENS CONJUGATED 0.625 MG PO TABS: 0.6250 mg | ORAL_TABLET | Freq: Every day | ORAL | Status: DC

## 2012-10-21 MED ORDER — FLUCONAZOLE 150 MG PO TABS: 150.0000 mg | ORAL_TABLET | Freq: Once | ORAL | Status: DC

## 2012-10-21 NOTE — Telephone Encounter (Signed)
Pt is calling about her prescriptions. She was seen in the office today by Dr. Edward Jolly, one prescription (Premarin) was called in to her pharmacy but the other medication was not received. The other medication was for a yeast infection; pt cannot remember the name but know it starts with the letter D. Please advise.

## 2012-10-21 NOTE — Telephone Encounter (Signed)
It was for Diflucan.  I just did it.

## 2012-10-21 NOTE — Patient Instructions (Addendum)

## 2012-10-21 NOTE — Progress Notes (Signed)
52 y.o. G1P1 MarriedCaucasianF here for annual exam.  No VB.  H/O TAH.  Doing well.  Daughter is 14.    Patient's last menstrual period was 10/11/2012.          Sexually active: yes  The current method of family planning is post menopausal status.    Exercising: yes  cardio and weight lifting 3-4 times weekly Smoker:  no  Health Maintenance: Pap:  08/22/2010 MMG:  10/2011 Colonoscopy:  2009.  Due this year.   BMD:   never TDaP:  09/2010 Labs: hgb-13.3   reports that she has never smoked. She has never used smokeless tobacco. She reports that she does not use illicit drugs.  Past Medical History  Diagnosis Date  . Fibroid   . Hx of migraines     Past Surgical History  Procedure Laterality Date  . Breast surgery    . Abdominal hysterectomy    . Pelvic laparoscopy      Current Outpatient Prescriptions  Medication Sig Dispense Refill  . Biotin 1 MG CAPS Take by mouth daily.      Marland Kitchen estrogens, conjugated, (PREMARIN) 0.625 MG tablet Take 1 tablet (0.625 mg total) by mouth daily. Take daily for 21 days then do not take for 7 days.  30 tablet  0  . Lysine 500 MG CAPS Take by mouth daily.       No current facility-administered medications for this visit.    Family History  Problem Relation Age of Onset  . Clotting disorder Mother   . Prostate cancer Father   . Clotting disorder Father   . Cervical cancer Maternal Grandmother     ROS:  Pertinent items are noted in HPI.  Otherwise, a comprehensive ROS was negative.  Exam:   BP 100/62  Ht 5\' 6"  (1.676 m)  Wt 135 lb 6.4 oz (61.417 kg)  BMI 21.86 kg/m2  LMP 10/11/2012   Height: 5\' 6"  (167.6 cm)  Ht Readings from Last 3 Encounters:  10/21/12 5\' 6"  (1.676 m)  11/29/08 5\' 7"  (1.702 m)  11/01/08 5\' 7"  (1.702 m)    General appearance: alert, cooperative and appears stated age Head: Normocephalic, without obvious abnormality, atraumatic Neck: no adenopathy, supple, symmetrical, trachea midline and thyroid normal to inspection  and palpation Lungs: clear to auscultation bilaterally Breasts: normal appearance, no masses or tenderness, bilateral implants present Heart: regular rate and rhythm Abdomen: soft, non-tender; bowel sounds normal; no masses,  no organomegaly Extremities: extremities normal, atraumatic, no cyanosis or edema Skin: Skin color, texture, turgor normal. No rashes or lesions Lymph nodes: Cervical, supraclavicular, and axillary nodes normal. No abnormal inguinal nodes palpated Neurologic: Grossly normal   Pelvic: External genitalia:  no lesions              Urethra:  normal appearing urethra with no masses, tenderness or lesions              Bartholins and Skenes: normal                 Vagina: normal appearing vagina with normal color, white discharge c/w yeast, no lesions              Cervix: absent              Pap taken: no Bimanual Exam:  Uterus:  uterus absent              Adnexa: no mass, fullness, tenderness  Rectovaginal: Confirms               Anus:  normal sphincter tone, no lesions  A:  Well Woman with normal exam PMP on HRT H/O TAH 2005, ovaries remain Yeast vaginitis today  P:   Mammogram, d/w pt 3D imaging No pap smear today.  H/O TAH for benign indications. Continues premarin 0.625mg  qd #90/3 RF Diflucan 150mg  po x i today, repeat 48 hrs.  Call with any continued symptoms. return annually or prn  An After Visit Summary was printed and given to the patient.

## 2012-10-21 NOTE — Telephone Encounter (Signed)
Dr. Hyacinth Meeker, pt was seen today for AEX.  Please advise additional medication.

## 2012-10-22 ENCOUNTER — Encounter: Payer: Self-pay | Admitting: Obstetrics & Gynecology

## 2012-10-23 ENCOUNTER — Ambulatory Visit: Payer: Self-pay | Admitting: Obstetrics & Gynecology

## 2012-12-20 ENCOUNTER — Encounter: Payer: Self-pay | Admitting: Obstetrics & Gynecology

## 2012-12-20 MED ORDER — FLUCONAZOLE 150 MG PO TABS: 150.0000 mg | ORAL_TABLET | Freq: Once | ORAL | Status: DC

## 2012-12-20 NOTE — Telephone Encounter (Signed)
Order for Diflucan sent to pharmacy.  Well known patient to me.

## 2013-01-25 ENCOUNTER — Other Ambulatory Visit: Payer: Self-pay

## 2013-01-25 DIAGNOSIS — Z1231 Encounter for screening mammogram for malignant neoplasm of breast: Secondary | ICD-10-CM

## 2013-03-01 ENCOUNTER — Ambulatory Visit
Admission: RE | Admit: 2013-03-01 | Discharge: 2013-03-01 | Disposition: A | Discharge status: Home / Self Care | Payer: Managed Care, Other (non HMO) | Source: Ambulatory Visit

## 2013-03-01 DIAGNOSIS — Z1231 Encounter for screening mammogram for malignant neoplasm of breast: Secondary | ICD-10-CM

## 2013-03-01 IMAGING — MG IMPLANT SCREENING - COMBO
9 of 13 series · 9 of 29 positions shown · non-contrast
Comparison: Previous exam(s).

CLINICAL DATA: Screening.

[L MLO (1 of 3)]
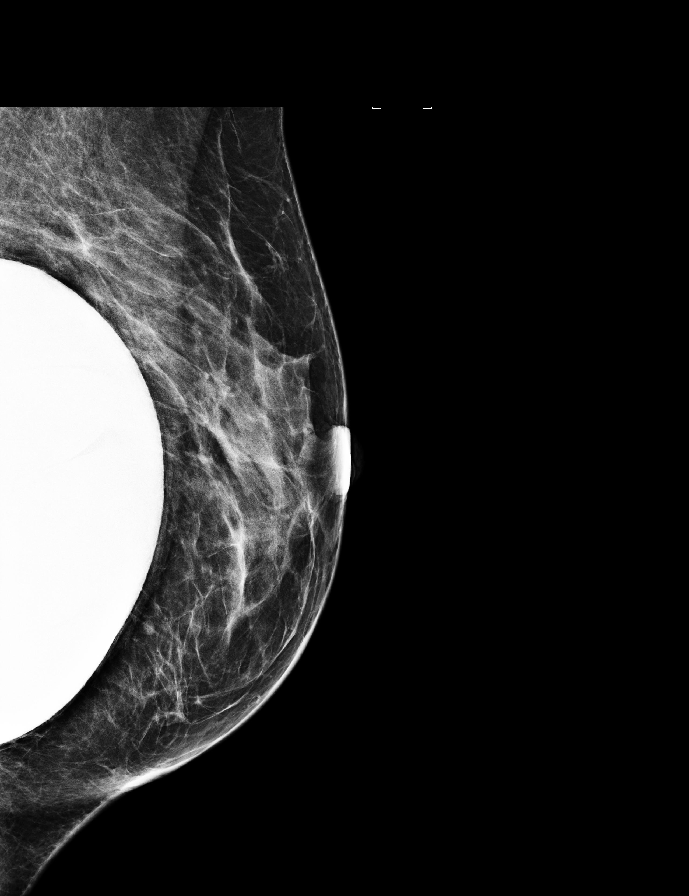

[R MLO (1 of 2)]
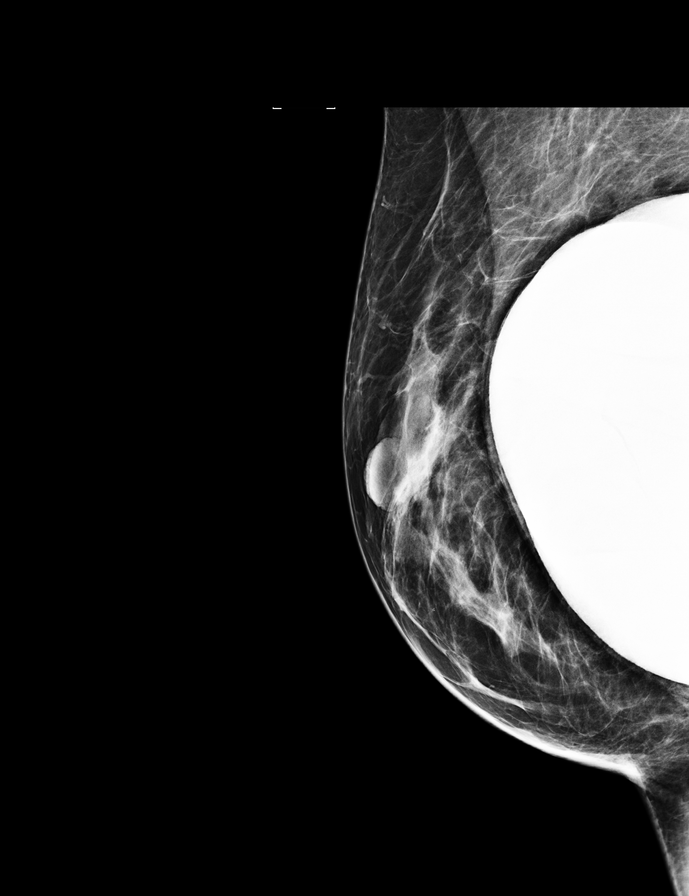

[L CC (1 of 2)]
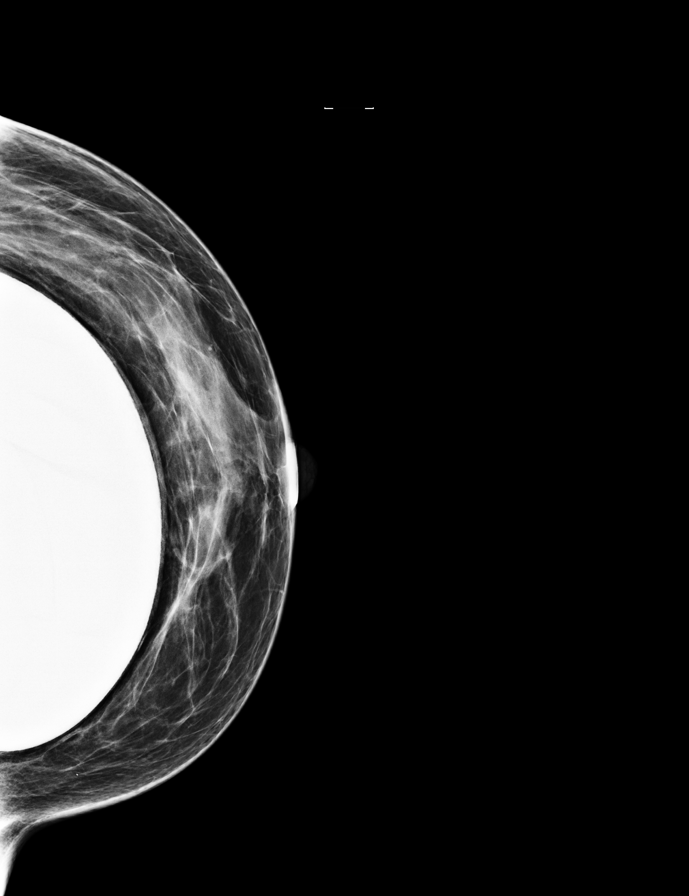

[R CC (1 of 2)]
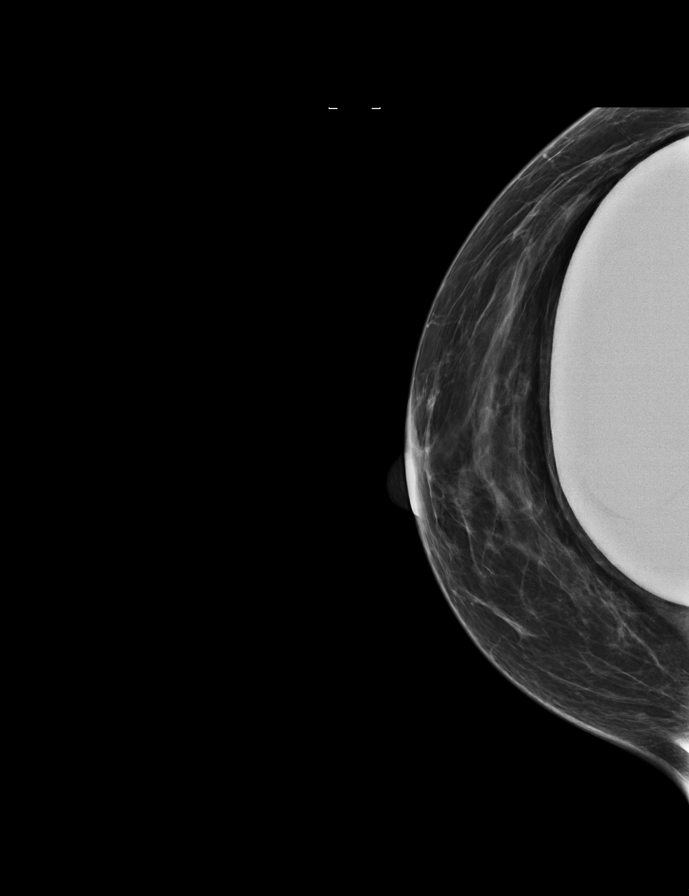

[L MLO (2 of 3)]
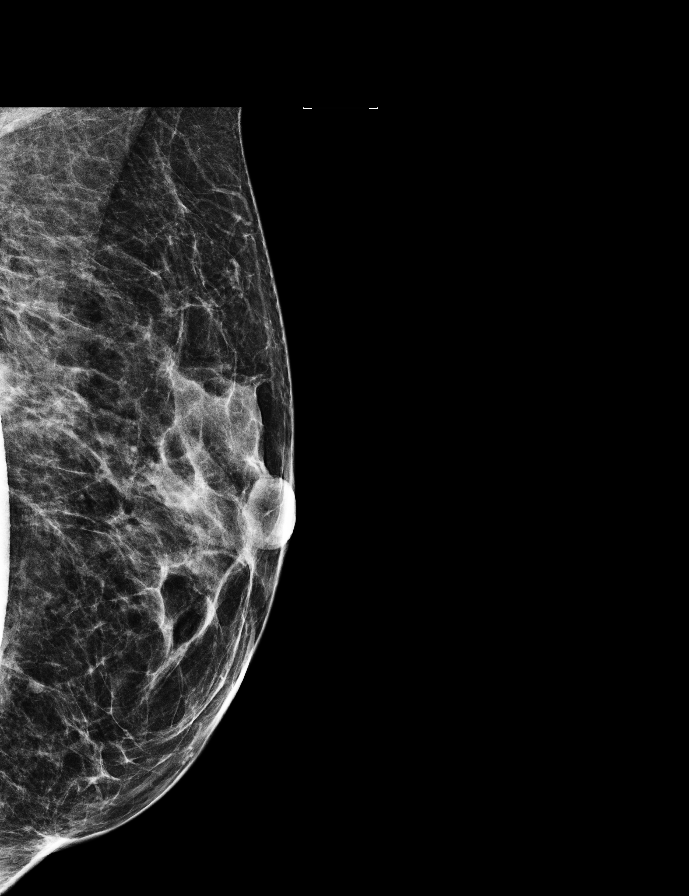

[R MLO (2 of 2)]
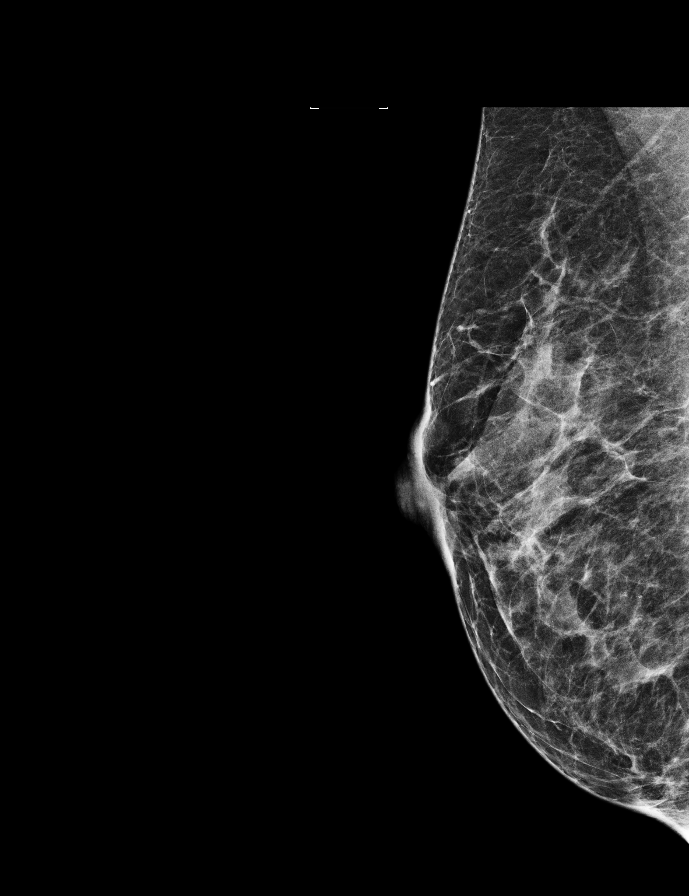

[R CC (2 of 2)]
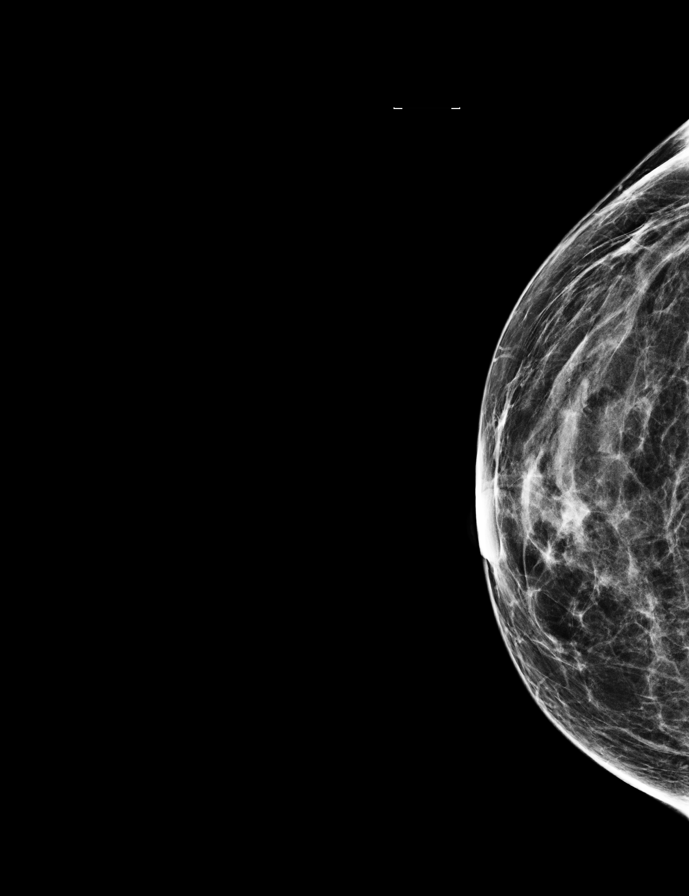

[L CC (2 of 2)]
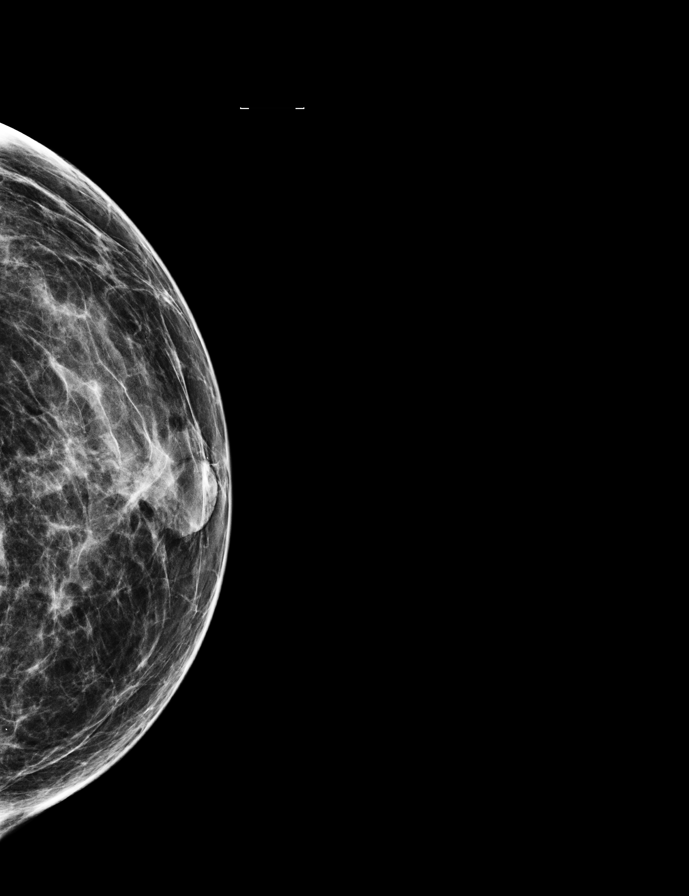

[L MLO (3 of 3)]
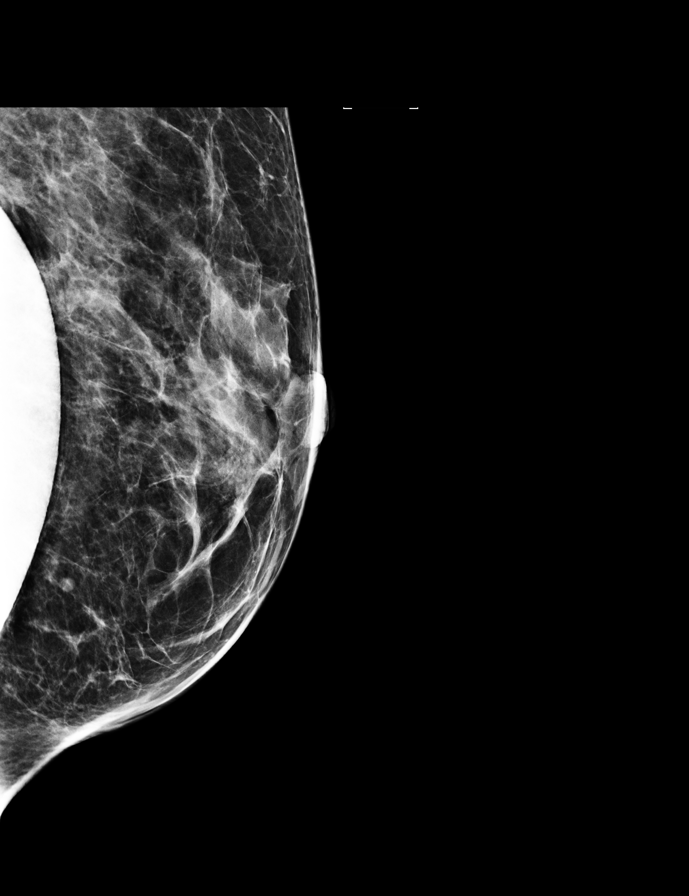

[9 of 29 positions shown; findings below may reference images not displayed]

DIGITAL SCREENING BILATERAL MAMMOGRAM WITH IMPLANTS AND CAD
DIGITAL BREAST TOMOSYNTHESIS

Digital breast tomosynthesis images are acquired in two
projections.  These images are reviewed in combination with the
digital mammogram, confirming the findings below.
The patient has subpectoral implants. Standard and implant
displaced views were performed.
FINDINGS: ACR Breast Density Category b:  There are scattered areas of
fibroglandular density.

There are no findings suspicious for malignancy.

Images were processed with CAD.
IMPRESSION: No mammographic evidence of malignancy.

A result letter of this screening mammogram will be mailed directly
to the patient.

RECOMMENDATION:
Screening mammogram in one year. (Code:[7W])

BI-RADS CATEGORY 1:  Negative.

## 2013-03-15 ENCOUNTER — Encounter: Payer: Self-pay | Admitting: Internal Medicine

## 2013-04-29 ENCOUNTER — Other Ambulatory Visit: Payer: Self-pay

## 2013-09-13 ENCOUNTER — Encounter: Payer: Self-pay | Admitting: Internal Medicine

## 2013-11-16 ENCOUNTER — Other Ambulatory Visit: Payer: Self-pay | Admitting: Obstetrics & Gynecology

## 2013-11-16 NOTE — Telephone Encounter (Signed)
Last AEX and refill 10/21/12 #90/4 refills Next appt 01/18/14 MMG 02/2013 BI-RADS 1.  Rx sent to last until appt 12/2013.

## 2013-12-23 DIAGNOSIS — M19049 Primary osteoarthritis, unspecified hand: Secondary | ICD-10-CM | POA: Insufficient documentation

## 2014-01-18 ENCOUNTER — Ambulatory Visit (INDEPENDENT_AMBULATORY_CARE_PROVIDER_SITE_OTHER): Payer: Managed Care, Other (non HMO) | Admitting: Obstetrics & Gynecology

## 2014-01-18 ENCOUNTER — Encounter: Payer: Self-pay | Admitting: Obstetrics & Gynecology

## 2014-01-18 VITALS — BP 114/68 | HR 60 | Resp 16 | Ht 66.0 in | Wt 140.8 lb

## 2014-01-18 DIAGNOSIS — Z0000 Encounter for general adult medical examination without abnormal findings: Secondary | ICD-10-CM

## 2014-01-18 DIAGNOSIS — Z01419 Encounter for gynecological examination (general) (routine) without abnormal findings: Secondary | ICD-10-CM

## 2014-01-18 DIAGNOSIS — M79642 Pain in left hand: Secondary | ICD-10-CM

## 2014-01-18 DIAGNOSIS — M79609 Pain in unspecified limb: Secondary | ICD-10-CM

## 2014-01-18 LAB — COMPREHENSIVE METABOLIC PANEL
ALK PHOS: 36 U/L — AB (ref 39–117)
ALT: 14 U/L (ref 0–35)
AST: 16 U/L (ref 0–37)
Albumin: 4.2 g/dL (ref 3.5–5.2)
BILIRUBIN TOTAL: 0.5 mg/dL (ref 0.2–1.2)
BUN: 8 mg/dL (ref 6–23)
CHLORIDE: 103 meq/L (ref 96–112)
CO2: 26 mEq/L (ref 19–32)
CREATININE: 0.75 mg/dL (ref 0.50–1.10)
Calcium: 9.2 mg/dL (ref 8.4–10.5)
GLUCOSE: 89 mg/dL (ref 70–99)
POTASSIUM: 3.8 meq/L (ref 3.5–5.3)
Sodium: 138 mEq/L (ref 135–145)
Total Protein: 6.6 g/dL (ref 6.0–8.3)

## 2014-01-18 LAB — POCT URINALYSIS DIPSTICK
BILIRUBIN UA: NEGATIVE
Glucose, UA: NEGATIVE
KETONES UA: NEGATIVE
Leukocytes, UA: NEGATIVE
NITRITE UA: NEGATIVE
PH UA: 5
PROTEIN UA: NEGATIVE
RBC UA: NEGATIVE
UROBILINOGEN UA: NEGATIVE

## 2014-01-18 LAB — LIPID PANEL
CHOLESTEROL: 190 mg/dL (ref 0–200)
HDL: 74 mg/dL (ref >39)
LDL Cholesterol: 94 mg/dL (ref 0–99)
Total CHOL/HDL Ratio: 2.6 Ratio
Triglycerides: 109 mg/dL (ref <150)
VLDL: 22 mg/dL (ref 0–40)

## 2014-01-18 MED ORDER — ESTROGENS CONJUGATED 0.625 MG PO TABS: 0.6250 mg | ORAL_TABLET | Freq: Every day | ORAL | Status: DC

## 2014-01-18 NOTE — Progress Notes (Signed)
53 y.o. G1P1 MarriedCaucasianF here for annual exam.  Daughter just got learner's permit.  She is doing well in school.    Pt reports recent issues with pain in two joints in left hand.  Saw ortho in [place], [place], where father lives.  Father is retired Energy manager.  Blood work done.  She does not have copied.  Rheumatologist consult recommended.  Will refer to Dr. Estanislado Pandy and get records.  Patient's last menstrual period was 06/25/2003.          Sexually active: No.  The current method of family planning is status post hysterectomy.    Exercising: Yes.    cardio and weights Smoker:  no  Health Maintenance: Pap:  08/22/10 WNL History of abnormal Pap:  no MMG:  03/01/13-normal Colonoscopy:  2009-repeat in 5 years-aware was due at the end of 2014 BMD:   none TDaP:  2013 Screening Labs: today, Hb today: 12.6, Urine today: negative   reports that she has never smoked. She has never used smokeless tobacco. She reports that she drinks about .5 ounces of alcohol per week. She reports that she does not use illicit drugs.  Past Medical History  Diagnosis Date  . Fibroid   . Hx of migraines   . Hand pain     left hand-had 2 cortisone injections    Past Surgical History  Procedure Laterality Date  . Breast surgery    . Abdominal hysterectomy    . Pelvic laparoscopy      Current Outpatient Prescriptions  Medication Sig Dispense Refill  . Biotin 1 MG CAPS Take by mouth daily.      Marland Kitchen Lysine 500 MG CAPS Take by mouth daily.      Marland Kitchen PREMARIN 0.625 MG tablet TAKE 1 TABLET BY MOUTH DAILY  90 tablet  0   No current facility-administered medications for this visit.    Family History  Problem Relation Age of Onset  . Clotting disorder Mother   . Prostate cancer Father   . Clotting disorder Father   . Cervical cancer Maternal Grandmother     ROS:  Pertinent items are noted in HPI.  Otherwise, a comprehensive ROS was negative.  Exam:   BP 114/68  Pulse 60  Resp 16  Ht 5\' 6"  (1.676 m)  Wt  140 lb 12.8 oz (63.866 kg)  BMI 22.74 kg/m2  LMP 06/25/2003    Height: 5\' 6"  (167.6 cm)  Ht Readings from Last 3 Encounters:  01/18/14 5\' 6"  (1.676 m)  10/21/12 5\' 6"  (1.676 m)  11/29/08 5\' 7"  (1.702 m)    General appearance: alert, cooperative and appears stated age Head: Normocephalic, without obvious abnormality, atraumatic Neck: no adenopathy, supple, symmetrical, trachea midline and thyroid normal to inspection and palpation Lungs: clear to auscultation bilaterally Breasts: normal appearance, no masses or tenderness Heart: regular rate and rhythm Abdomen: soft, non-tender; bowel sounds normal; no masses,  no organomegaly Extremities: extremities normal, atraumatic, no cyanosis or edema Skin: Skin color, texture, turgor normal. No rashes or lesions Lymph nodes: Cervical, supraclavicular, and axillary nodes normal. No abnormal inguinal nodes palpated Neurologic: Grossly normal   Pelvic: External genitalia:  no lesions              Urethra:  normal appearing urethra with no masses, tenderness or lesions              Bartholins and Skenes: normal                 Vagina:  normal appearing vagina with normal color and discharge, no lesions              Cervix: absent              Pap taken: No. Bimanual Exam:  Uterus:  uterus absent              Adnexa: normal adnexa and no mass, fullness, tenderness               Rectovaginal: Confirms               Anus:  normal sphincter tone, no lesions  A:  Well Woman with normal exam  PMP on HRT  H/O TAH 2005, ovaries remain  Hand, shoulder, feet pain.  Evaluation with ortho in [place], [place].  P: Mammogram, d/w pt 3D imaging  No pap smear today. H/O TAH for benign indications.  Continues premarin 0.625mg  qd #90/3 RF  Referral to Dr. Estanislado Pandy at [facility] return annually or prn  An After Visit Summary was printed and given to the patient.

## 2014-01-19 LAB — TSH: TSH: 2.58 u[IU]/mL (ref 0.350–4.500)

## 2014-01-19 LAB — VITAMIN D 25 HYDROXY (VIT D DEFICIENCY, FRACTURES): VIT D 25 HYDROXY: 53 ng/mL (ref 30–89)

## 2014-01-19 LAB — HEMOGLOBIN, FINGERSTICK: Hemoglobin, fingerstick: 12.6 g/dL (ref 12.0–16.0)

## 2014-02-17 ENCOUNTER — Other Ambulatory Visit: Payer: Self-pay | Admitting: Obstetrics & Gynecology

## 2014-02-18 NOTE — Telephone Encounter (Signed)
Last AEX: 01/18/14 Last refill:01/18/14 #90 X 4 Current AEX:02/03/15  New rx sent on 01/18/14

## 2014-02-22 ENCOUNTER — Telehealth: Payer: Self-pay | Admitting: Obstetrics & Gynecology

## 2014-02-22 NOTE — Telephone Encounter (Signed)
Pt said dr mill was supposed to give her a referral for rheumatologist but she has not heard anything

## 2014-02-22 NOTE — Telephone Encounter (Signed)
Routing to [place] as referral has been placed by Dr. Sabra Heck.

## 2014-02-23 NOTE — Telephone Encounter (Signed)
Message left to return call to [place] at [phone].   Appears we may be waiting for records to send to Dr. Estanislado Pandy? Will discuss with patient at return call.

## 2014-02-24 NOTE — Telephone Encounter (Signed)
Patient returned call and discussed. Patient states she signed medical release of information to [place] to obtain records from Dr. Jodi Geralds (an MD in [place]).  Reception does not have her signed release, possibly, the records are waiting for Dr. Ammie Ferrier review. We will need records from Dr. Jodi Geralds to send with referral to Dr. Estanislado Pandy.  Advised that as soon as records are received and reviewed by Dr. Sabra Heck that we will send referral to Dr. Estanislado Pandy. Advised that Dr. Estanislado Pandy reviews all incoming referrals prior to appointment being made. Patient agreeable and will wait to hear from our office or Dr. Arlean Hopping office.  Routing to Dr. Sabra Heck  Routing to Felipa Emory with update.

## 2014-03-04 NOTE — Telephone Encounter (Signed)
Faxed additional records to Dr Anette Guarneri office for evaluation.

## 2014-03-04 NOTE — Telephone Encounter (Signed)
Please let pt know that despite multiple phone calls and faxing release, we have only received the office notes.  Dr. Estanislado Pandy will not take a new referral without some initial lab work.  Can she call as well to see if she can get it?  Sorry this is taking so long.  I have the records I've been sent on my desk and they are only office notes.

## 2014-03-04 NOTE — Telephone Encounter (Signed)
Left detailed message on mobile to advise that we are having difficulty obtaining lab work results from prior doctors office.  Left fax number and our number for return call.  Okay for detailed message per designated party release form.

## 2014-03-22 NOTE — Telephone Encounter (Signed)
Left message for patient to call back. Would like to make sure patient is aware of appt with Dr Estanislado Pandy on Oct 04 @ 0815.

## 2014-04-25 ENCOUNTER — Encounter: Payer: Self-pay | Admitting: Obstetrics & Gynecology

## 2014-09-05 ENCOUNTER — Other Ambulatory Visit: Payer: Self-pay

## 2014-09-05 DIAGNOSIS — Z1231 Encounter for screening mammogram for malignant neoplasm of breast: Secondary | ICD-10-CM

## 2014-09-28 ENCOUNTER — Ambulatory Visit
Admission: RE | Admit: 2014-09-28 | Discharge: 2014-09-28 | Disposition: A | Discharge status: Home / Self Care | Payer: BLUE CROSS/BLUE SHIELD | Source: Ambulatory Visit

## 2014-09-28 DIAGNOSIS — Z1231 Encounter for screening mammogram for malignant neoplasm of breast: Secondary | ICD-10-CM

## 2014-09-28 IMAGING — MG MM SCREENING BREAST W/IMPLANT TOMO BILATERAL
9 of 12 series · 9 of 28 positions shown · non-contrast
Comparison: Previous exam(s)

CLINICAL DATA: Screening.

EXAM:
DIGITAL SCREENING BILATERAL MAMMOGRAM WITH IMPLANTS, 3D TOMO WITH
CAD
The patient has retropectoral implants. Standard and implant
displaced views were performed.

[R CC (1 of 2)]
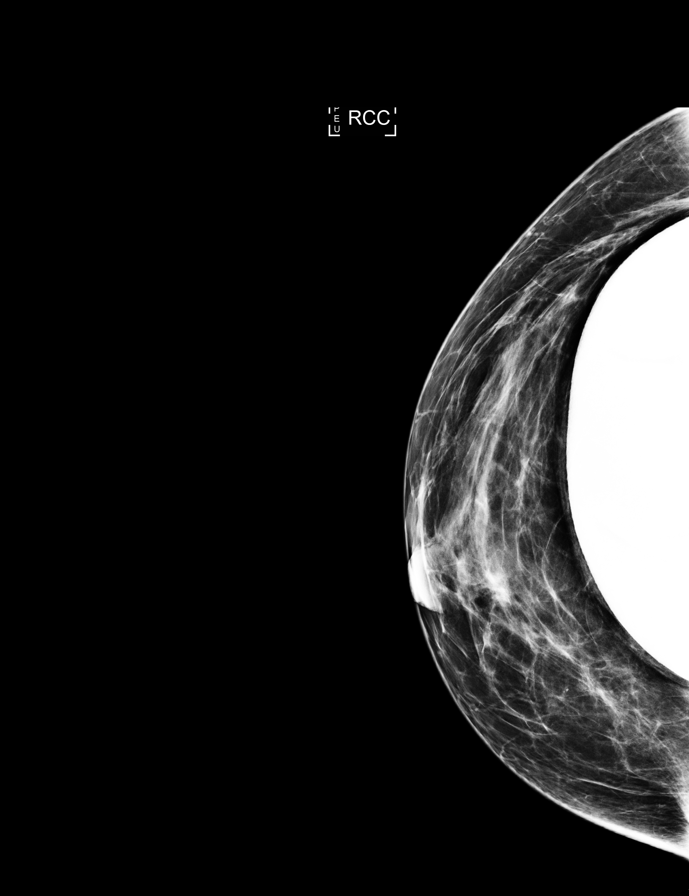

[R MLO (1 of 2)]
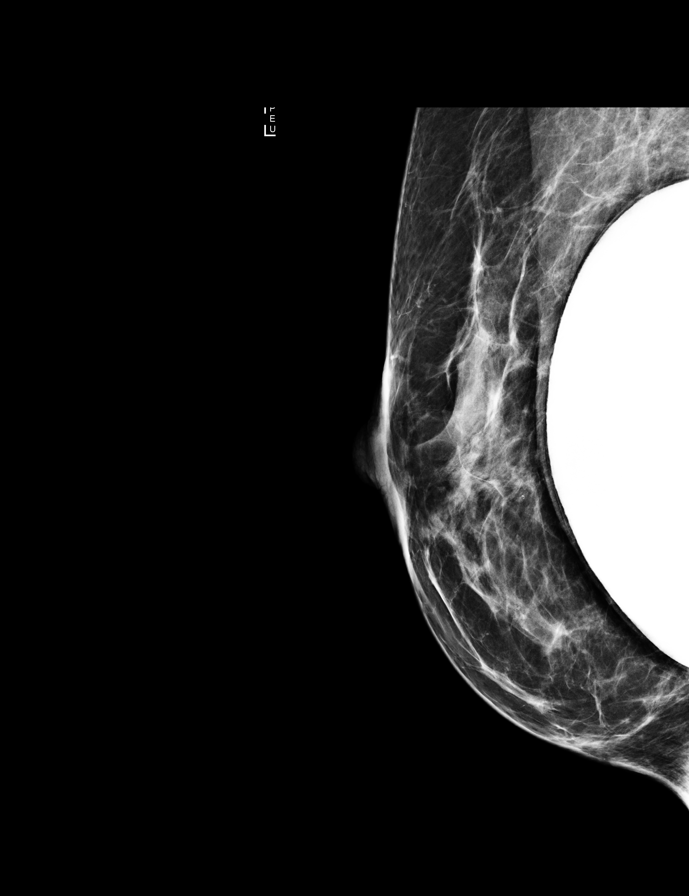

[L CC (1 of 2)]
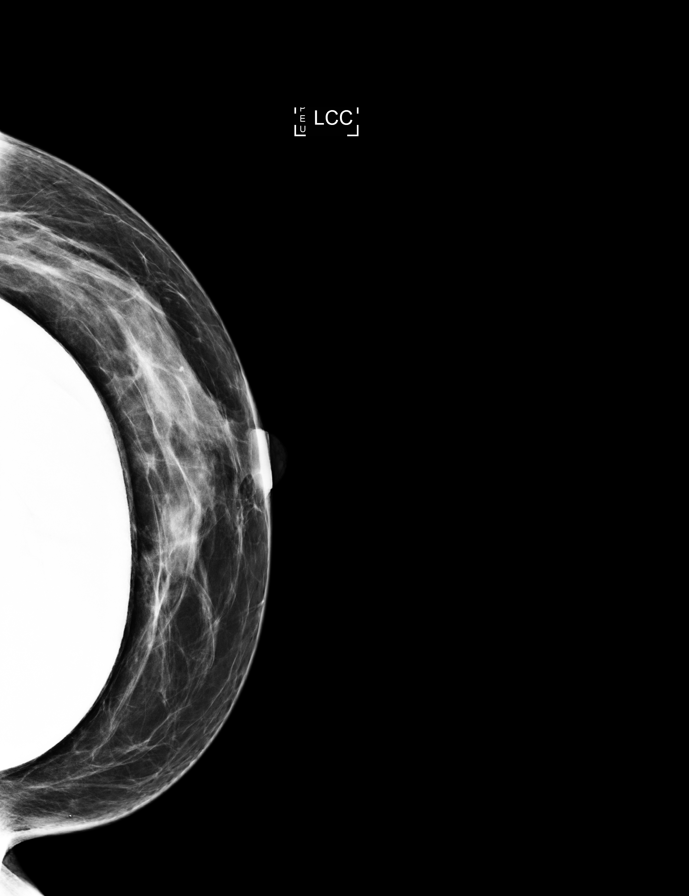

[L MLO (1 of 2)]
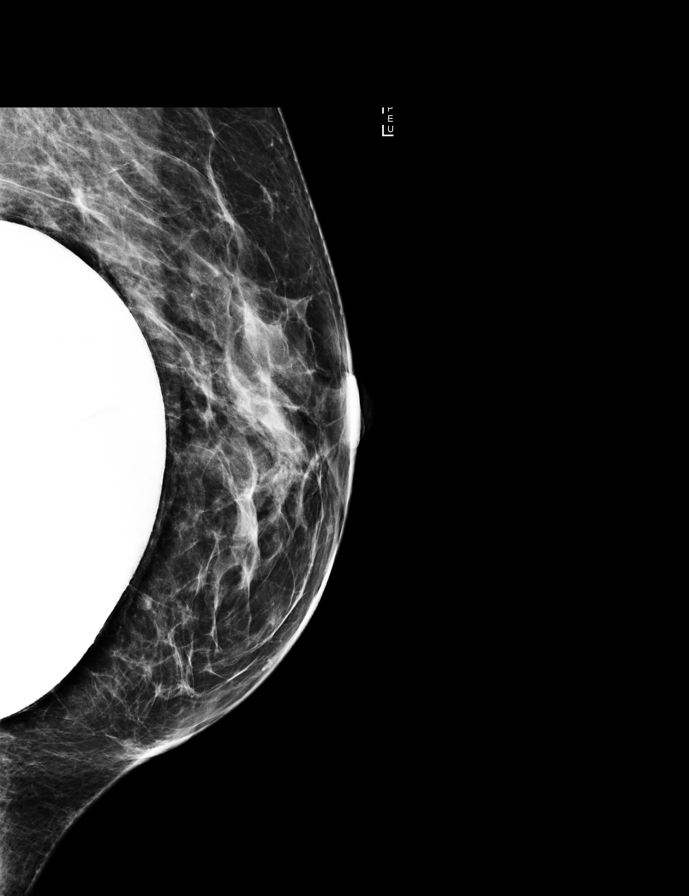

[R MLO (2 of 2)]
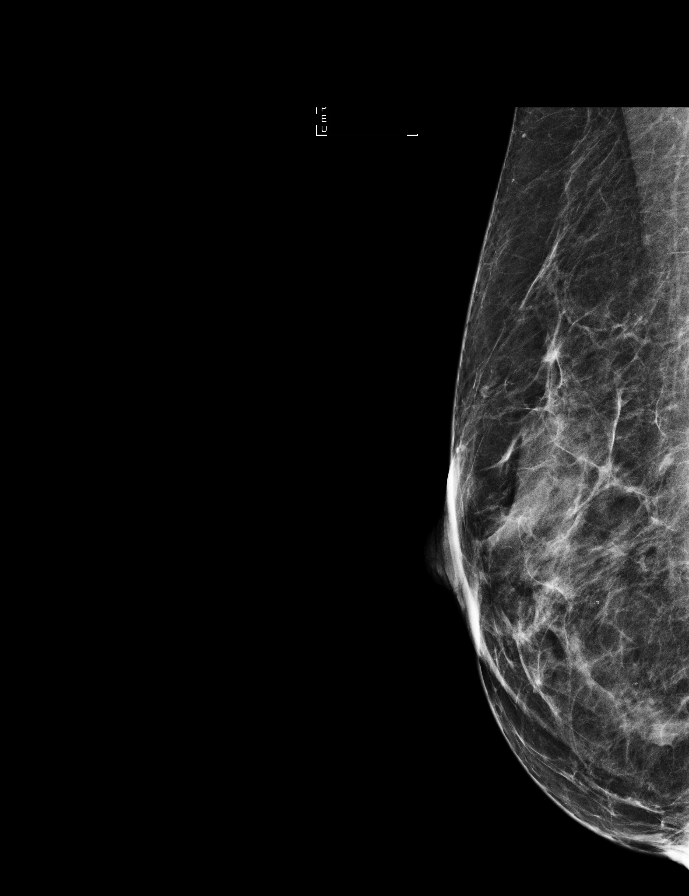

[L CC (2 of 2)]
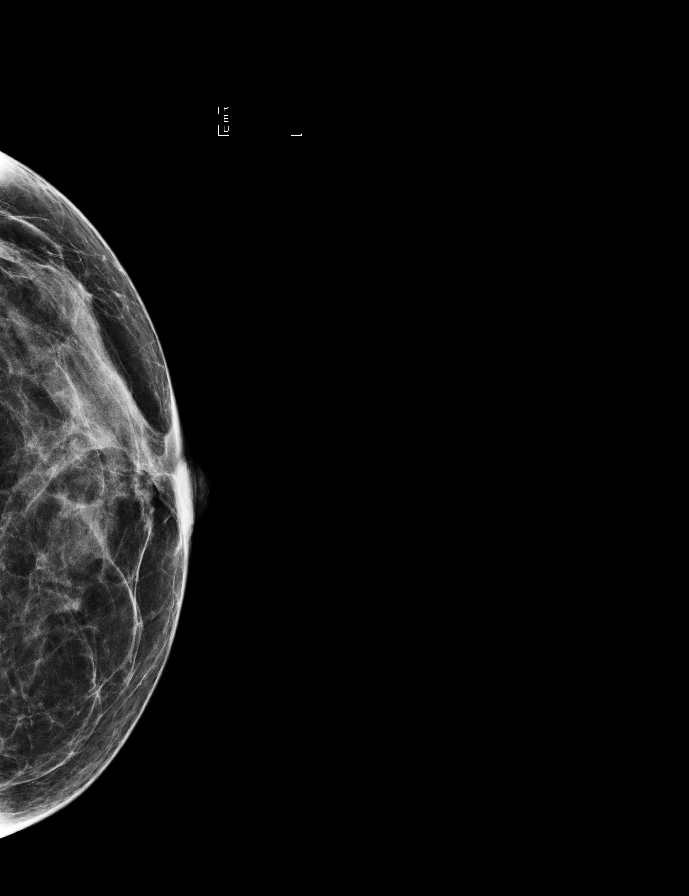

[R CC (2 of 2)]
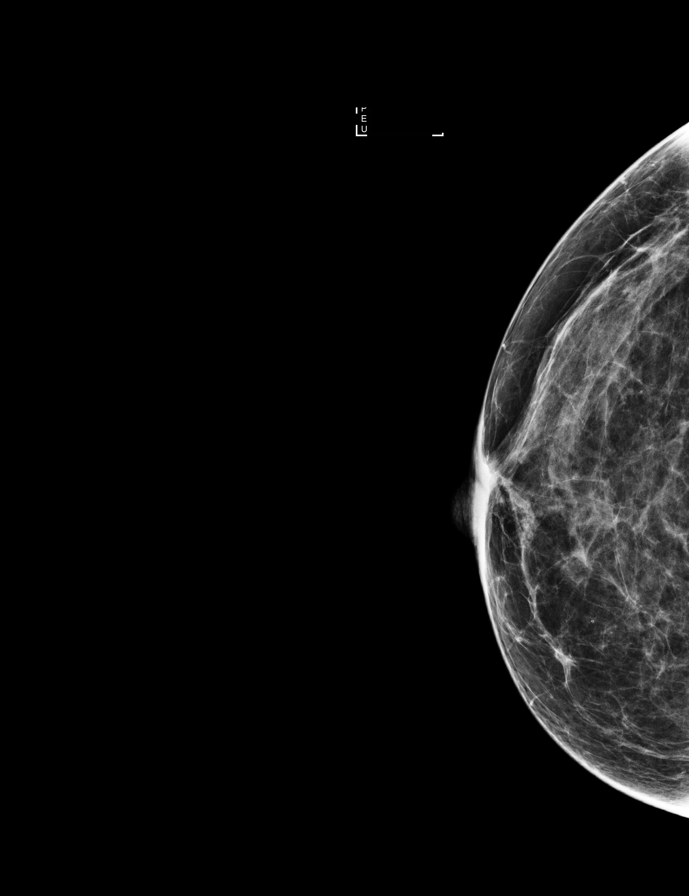

[L MLO (2 of 2)]
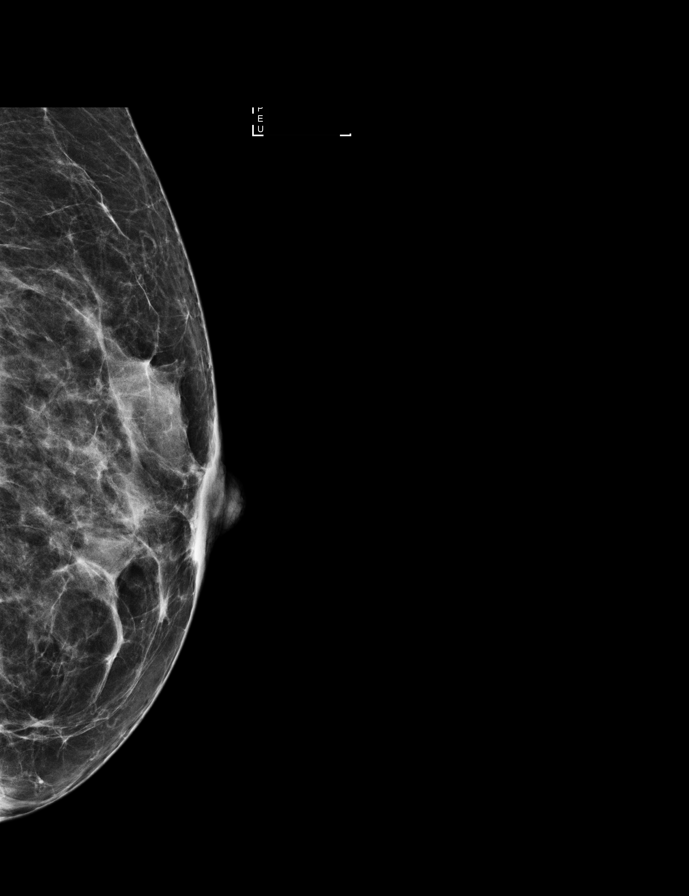

[L CC tomo · tomo slice 25/48.0]
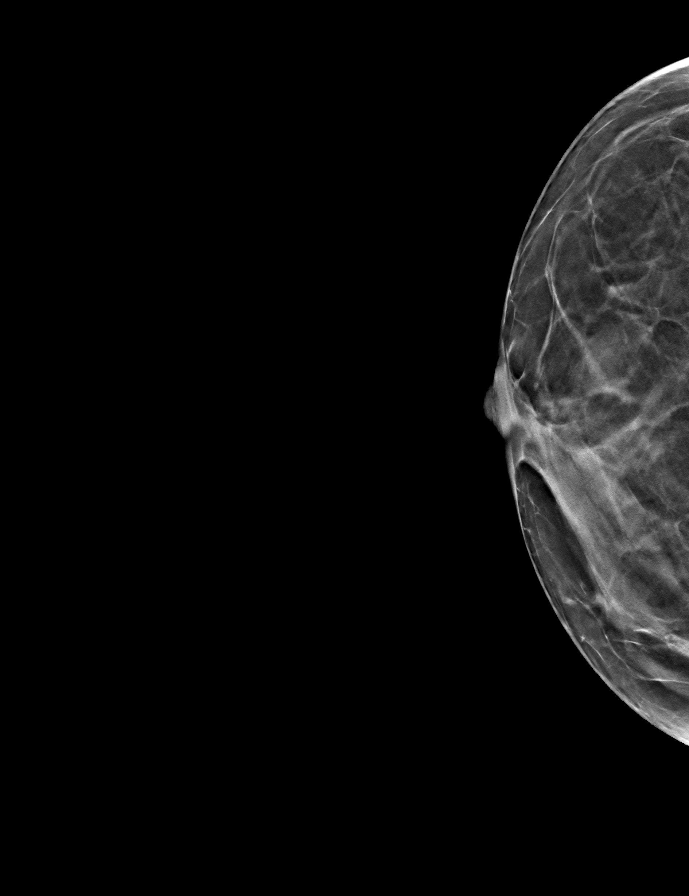

[9 of 28 positions shown; findings below may reference images not displayed]

ACR Breast Density Category c: The breast tissue is heterogeneously
dense, which may obscure small masses.
FINDINGS: There are no findings suspicious for malignancy. Images were
processed with CAD.
IMPRESSION: No mammographic evidence of malignancy. A result letter of this
screening mammogram will be mailed directly to the patient.

RECOMMENDATION:
Screening mammogram in one year. (Code:[J4])

BI-RADS CATEGORY  1:  Negative.

## 2015-02-03 ENCOUNTER — Encounter: Payer: Self-pay | Admitting: Obstetrics & Gynecology

## 2015-02-03 ENCOUNTER — Ambulatory Visit (INDEPENDENT_AMBULATORY_CARE_PROVIDER_SITE_OTHER): Payer: BLUE CROSS/BLUE SHIELD | Admitting: Obstetrics & Gynecology

## 2015-02-03 VITALS — BP 100/60 | HR 56 | Resp 16 | Ht 66.0 in | Wt 139.0 lb

## 2015-02-03 DIAGNOSIS — Z0000 Encounter for general adult medical examination without abnormal findings: Secondary | ICD-10-CM | POA: Diagnosis not present

## 2015-02-03 DIAGNOSIS — Z83518 Family history of other specified eye disorder: Secondary | ICD-10-CM | POA: Diagnosis not present

## 2015-02-03 DIAGNOSIS — Z01419 Encounter for gynecological examination (general) (routine) without abnormal findings: Secondary | ICD-10-CM

## 2015-02-03 DIAGNOSIS — M359 Systemic involvement of connective tissue, unspecified: Secondary | ICD-10-CM | POA: Diagnosis not present

## 2015-02-03 DIAGNOSIS — D8989 Other specified disorders involving the immune mechanism, not elsewhere classified: Secondary | ICD-10-CM

## 2015-02-03 LAB — COMPREHENSIVE METABOLIC PANEL
ALK PHOS: 33 U/L (ref 33–130)
ALT: 15 U/L (ref 6–29)
AST: 19 U/L (ref 10–35)
Albumin: 4 g/dL (ref 3.6–5.1)
BILIRUBIN TOTAL: 0.5 mg/dL (ref 0.2–1.2)
BUN: 10 mg/dL (ref 7–25)
CO2: 28 mmol/L (ref 20–31)
CREATININE: 0.75 mg/dL (ref 0.50–1.05)
Calcium: 9.1 mg/dL (ref 8.6–10.4)
Chloride: 104 mmol/L (ref 98–110)
GLUCOSE: 72 mg/dL (ref 65–99)
Potassium: 4 mmol/L (ref 3.5–5.3)
Sodium: 140 mmol/L (ref 135–146)
Total Protein: 6.4 g/dL (ref 6.1–8.1)

## 2015-02-03 LAB — LIPID PANEL
CHOLESTEROL: 176 mg/dL (ref 125–200)
HDL: 70 mg/dL (ref >=46)
LDL Cholesterol: 73 mg/dL (ref <130)
Total CHOL/HDL Ratio: 2.5 Ratio (ref <=5.0)
Triglycerides: 163 mg/dL — ABNORMAL HIGH (ref <150)
VLDL: 33 mg/dL — AB (ref <30)

## 2015-02-03 LAB — POCT URINALYSIS DIPSTICK
Bilirubin, UA: NEGATIVE
GLUCOSE UA: NEGATIVE
Nitrite, UA: NEGATIVE
RBC UA: NEGATIVE
UROBILINOGEN UA: NEGATIVE
pH, UA: 5

## 2015-02-03 LAB — HEMOGLOBIN, FINGERSTICK: Hemoglobin, fingerstick: 12.6 g/dL (ref 12.0–16.0)

## 2015-02-03 MED ORDER — ESTROGENS CONJUGATED 0.625 MG PO TABS: 0.6250 mg | ORAL_TABLET | Freq: Every day | ORAL | Status: DC

## 2015-02-03 MED ORDER — LUTEIN 20 MG PO TABS: ORAL_TABLET | ORAL | Status: DC

## 2015-02-03 NOTE — Progress Notes (Signed)
54 y.o. G1P1 MarriedCaucasianF here for annual exam.  Doing well.  Went to [employer] with her daughter.  Daughter is 53 and a rising Paramedic at [employer].  Pt did see Dr. 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  On plaquenil.  Pt reports this really has helped.  Has follow-up in October.  Being seen every four months.    Dad was just diagnosed with coronary blockage and was having a catheterization with stent placement.  With catheterization, it was much more extensive so then 5 vessel CABG was done.  He will be 80 at next birthday.   Patient's last menstrual period was 06/25/2003.          Sexually active: Yes.    The current method of family planning is status post hysterectomy.    Exercising: Yes.    cardio and weight training Smoker:  no  Health Maintenance: Pap:  08/22/10 WNL History of abnormal Pap:  no MMG:  09/28/14 3D BiRads 1-normal Colonoscopy:  2011-repeat in 5 years BMD:   none TDaP:  2013 Screening Labs: will do here, Hb today: will do here, Urine today: trace WBCs   reports that she has never smoked. She has never used smokeless tobacco. She reports that she drinks about 0.6 - 1.2 oz of alcohol per week. She reports that she does not use illicit drugs.  Past Medical History  Diagnosis Date  . Fibroid   . Hx of migraines   . Hand pain     left hand-had 2 cortisone injections  . 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 Breast surgery    . Abdominal hysterectomy    . Pelvic laparoscopy      Family History  Problem Relation Age of Onset  . Clotting disorder Mother   . Prostate cancer Father   . Clotting disorder Father   . Cervical cancer Maternal Grandmother     ROS:  Pertinent items are noted in HPI.  Otherwise, a comprehensive ROS was negative.  Exam: General appearance: alert, cooperative and appears stated age Head: Normocephalic, without obvious abnormality,  atraumatic Neck: no adenopathy, supple, symmetrical, trachea midline and thyroid normal to inspection and palpation Lungs: clear to auscultation bilaterally Breasts: normal appearance, no masses or tenderness, well healed scars from breast reduction Heart: regular rate and rhythm Abdomen: soft, non-tender; bowel sounds normal; no masses,  no organomegaly Extremities: extremities normal, atraumatic, no cyanosis or edema Skin: Skin color, texture, turgor normal. No rashes or lesions Lymph nodes: Cervical, supraclavicular, and axillary nodes normal. No abnormal inguinal nodes palpated Neurologic: Grossly normal   Pelvic: External genitalia:  no lesions              Urethra:  normal appearing urethra with no masses, tenderness or lesions              Bartholins and Skenes: normal                 Vagina: normal appearing vagina with normal color and discharge, no lesions              Cervix: absent              Pap taken: No. Bimanual Exam:  Uterus:  uterus absent              Adnexa: no mass, fullness, tenderness  Rectovaginal: Confirms               Anus:  normal sphincter tone, no lesions  Chaperone was present for exam.  A:  Well Woman with normal exam  PMP on HRT  H/O TAH 2005, ovaries remain  Family hx of macular degeneration (MGM and Mother).  Pt on Lutein. Family hx of CVD (father with CABG this year) H/O bilateral breast reduction  P: Mammogram, d/w pt 3D imaging  No pap smear today. H/O TAH for benign indications.  Continues premarin 0.625mg  qd #90/4 RF  CMP and Lipids today. return annually or prn

## 2015-02-06 ENCOUNTER — Telehealth: Payer: Self-pay | Admitting: *Deleted

## 2015-02-06 NOTE — Telephone Encounter (Signed)
-----   Message from [name], MD sent at 02/03/2015  4:39 PM EDT ----- Inform CMP normal.  Also inform lipids showed normal level, normal LDL, normal HDL but triglycerides were mildly elevated.  This may be due to increased sugar intake last night or if test wasn't fasting today.  No treatment needed.  Will repeat 1 year.

## 2015-02-06 NOTE — Telephone Encounter (Signed)
LMTC in regards to lab results -eh 

## 2015-02-09 ENCOUNTER — Telehealth: Payer: Self-pay | Admitting: *Deleted

## 2015-02-09 NOTE — Telephone Encounter (Signed)
Called Dr. Enriqueta Shutter office, they don't have any immunization records for pt.   Called pt. She will go next week to her pharmacy to get her pneumonia vaccine. She believes she won't need an order from [place] but will check and will request order then.   Dr. Lestine Box Encounter closed.

## 2015-02-09 NOTE — Telephone Encounter (Signed)
[name], MD  Elroy Channel, CMA            Got notes from Dr. Estanislado Pandy. She recommended pt having pneumonia vaccine. Not sure if she's done this. If not, can do with PCP or I can send rx in the mail for her to get at local, chain pharmacy like [facility] or [facility]. Just wanted to check.   MSM

## 2015-02-13 ENCOUNTER — Other Ambulatory Visit: Payer: Self-pay | Admitting: Obstetrics & Gynecology

## 2015-02-13 NOTE — Telephone Encounter (Signed)
02/03/15 Premarin 0.625 mg #90/4 rfs was [facility] [place]- rx denied.

## 2015-03-13 ENCOUNTER — Encounter: Payer: Self-pay | Admitting: Obstetrics & Gynecology

## 2015-08-07 ENCOUNTER — Encounter: Payer: Self-pay | Admitting: Obstetrics & Gynecology

## 2015-08-08 ENCOUNTER — Telehealth: Payer: Self-pay | Admitting: *Deleted

## 2015-08-08 NOTE — Telephone Encounter (Signed)
Good Morning Dr. Sabra Heck, I trust this email finds you well. I am requesting that you please give me a call tat yourearliest convenience as I need your guidance and advice on something related to Sway Guttierrez. It would be much easier to speak to you rather than trying to put it in an email. Thanks so much, Sylva [phone]

## 2015-08-08 NOTE — Telephone Encounter (Signed)
See My Chart message from patient below: Call to patient, left message to call back. Ask for triage nurse.

## 2015-08-22 NOTE — Telephone Encounter (Signed)
No response from patient. Any further contact needed or ok to close encounter?

## 2015-08-23 NOTE — Telephone Encounter (Signed)
Yes.  Ok to close encounter. 

## 2015-11-15 ENCOUNTER — Other Ambulatory Visit: Payer: Self-pay

## 2015-11-15 DIAGNOSIS — Z1231 Encounter for screening mammogram for malignant neoplasm of breast: Secondary | ICD-10-CM

## 2015-11-22 ENCOUNTER — Ambulatory Visit: Payer: BLUE CROSS/BLUE SHIELD

## 2015-11-30 ENCOUNTER — Ambulatory Visit
Admission: RE | Admit: 2015-11-30 | Discharge: 2015-11-30 | Disposition: A | Discharge status: Home / Self Care | Payer: BLUE CROSS/BLUE SHIELD | Source: Ambulatory Visit

## 2015-11-30 ENCOUNTER — Other Ambulatory Visit: Payer: Self-pay | Admitting: Obstetrics & Gynecology

## 2015-11-30 DIAGNOSIS — Z1231 Encounter for screening mammogram for malignant neoplasm of breast: Secondary | ICD-10-CM

## 2015-11-30 IMAGING — MG 2D DIGITAL SCREENING BILATERAL MAMMOGRAM WITH IMPLANTS, CAD AND
9 of 16 series · 9 of 32 positions shown · non-contrast
Comparison: Previous exam(s).

CLINICAL DATA: Screening.

EXAM:
2D DIGITAL SCREENING BILATERAL MAMMOGRAM WITH IMPLANTS, CAD AND
ADJUNCT TOMO
The patient has retropectoral implants. Standard and implant
displaced views were performed.

[R MLO]
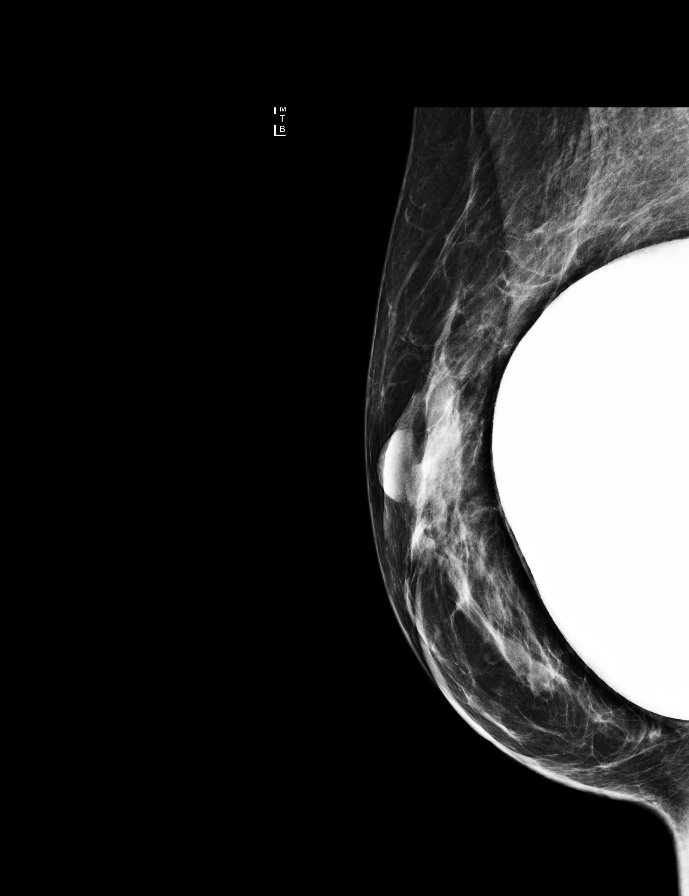

[L MLO]
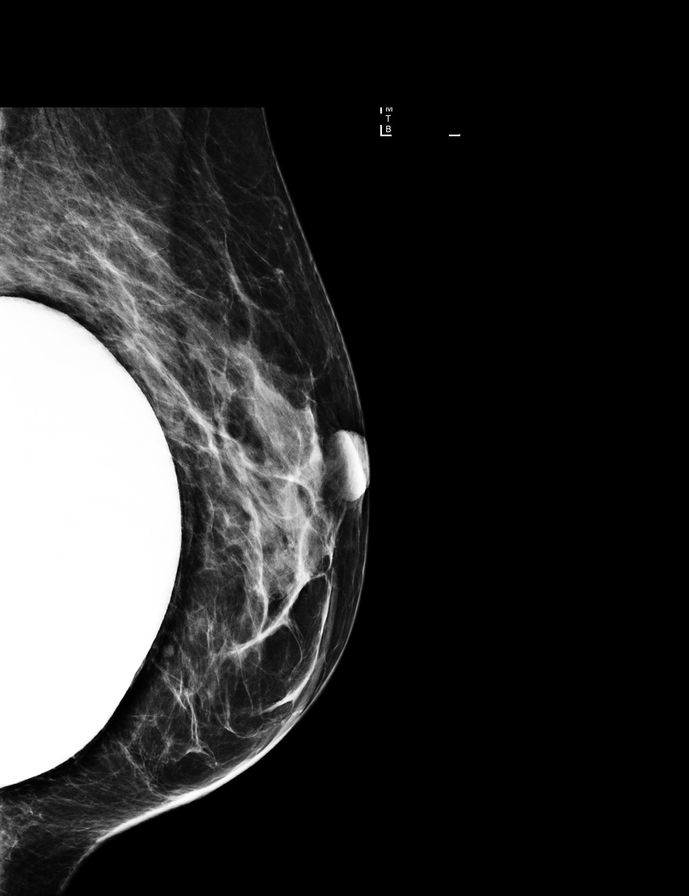

[L CC]
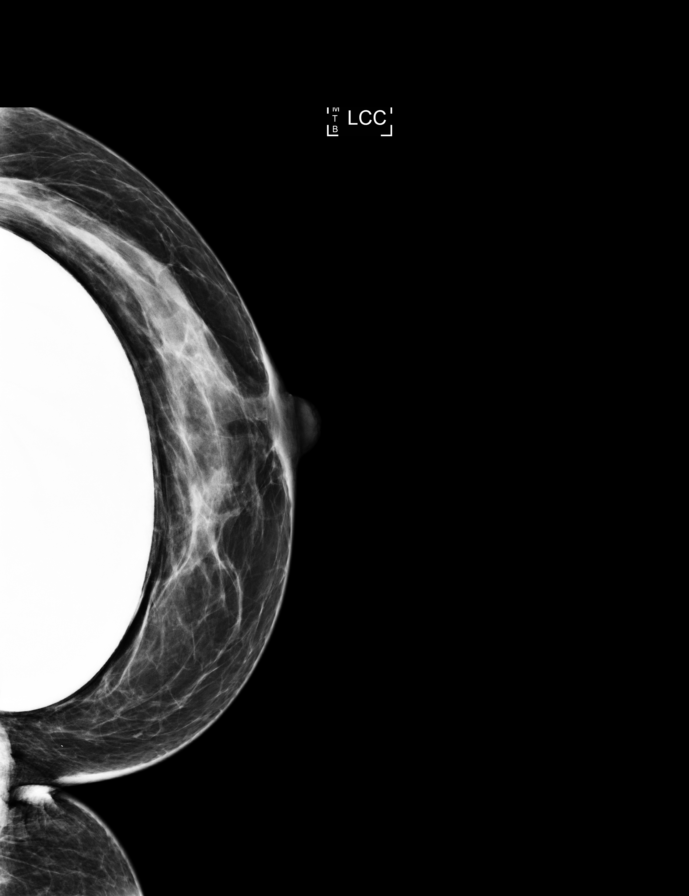

[R CC (1 of 2)]
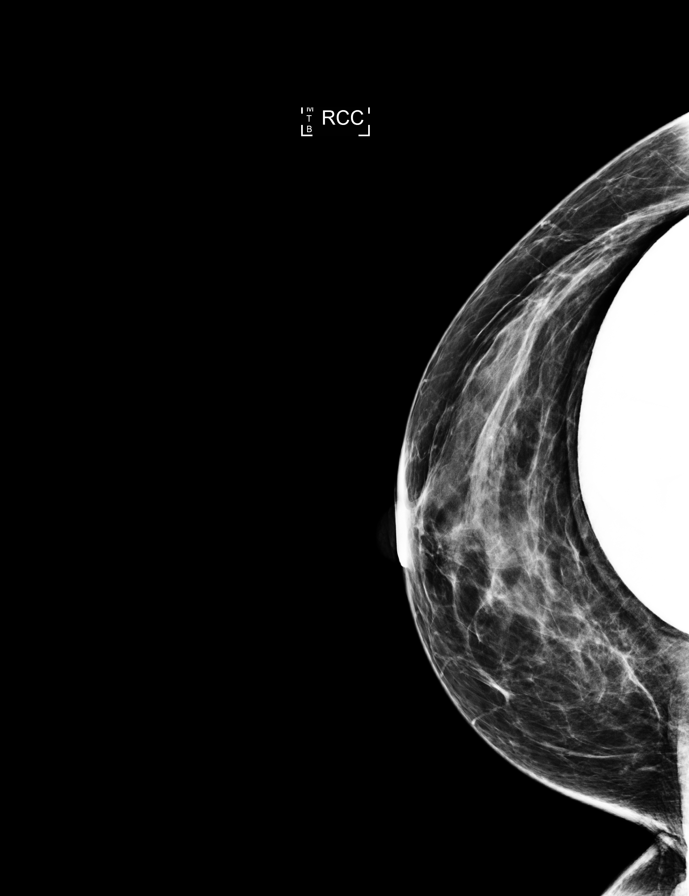

[R MLO synth-2D]
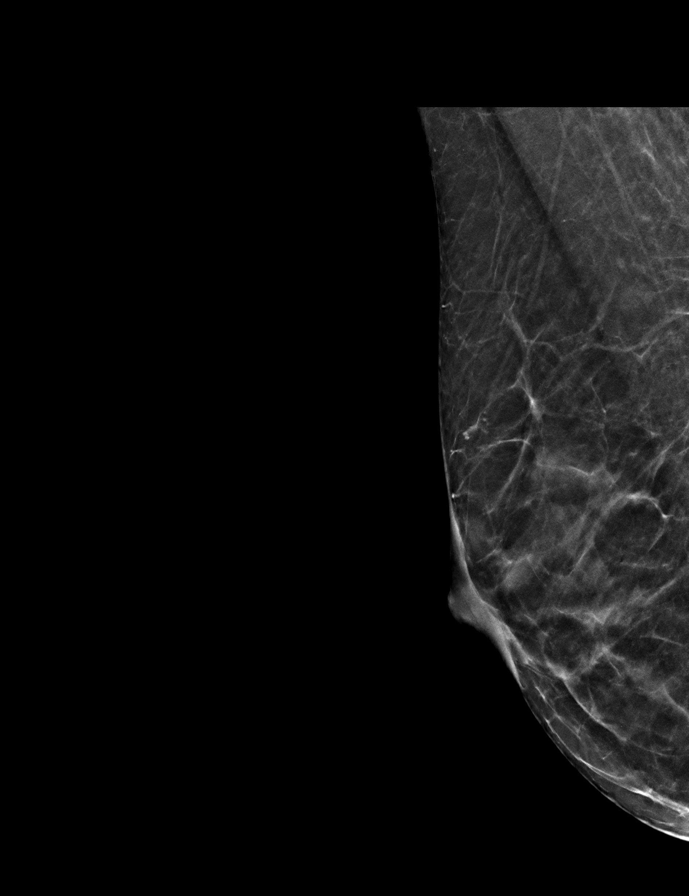

[L MLO synth-2D]
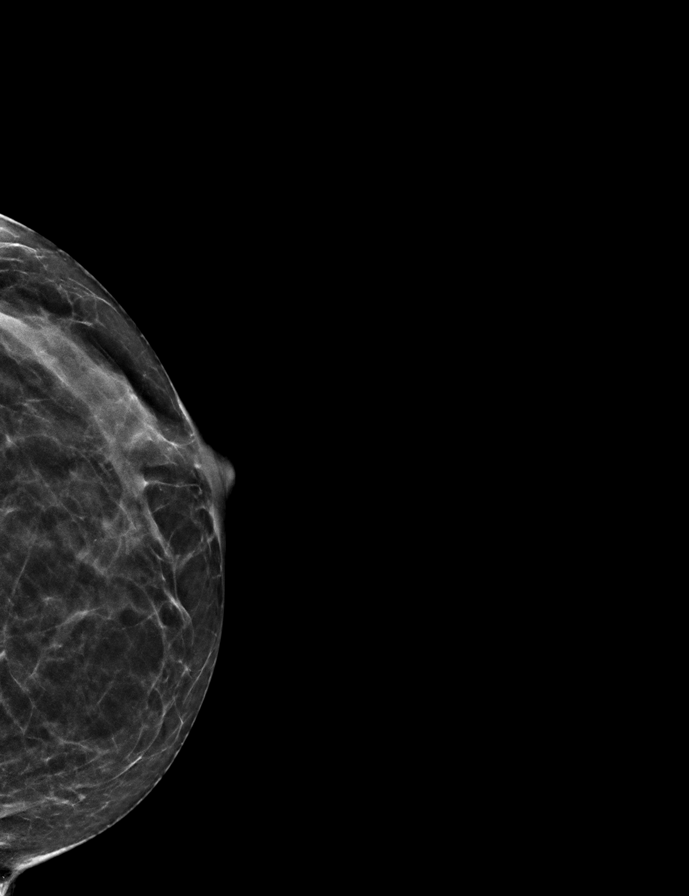

[R CC (2 of 2)]
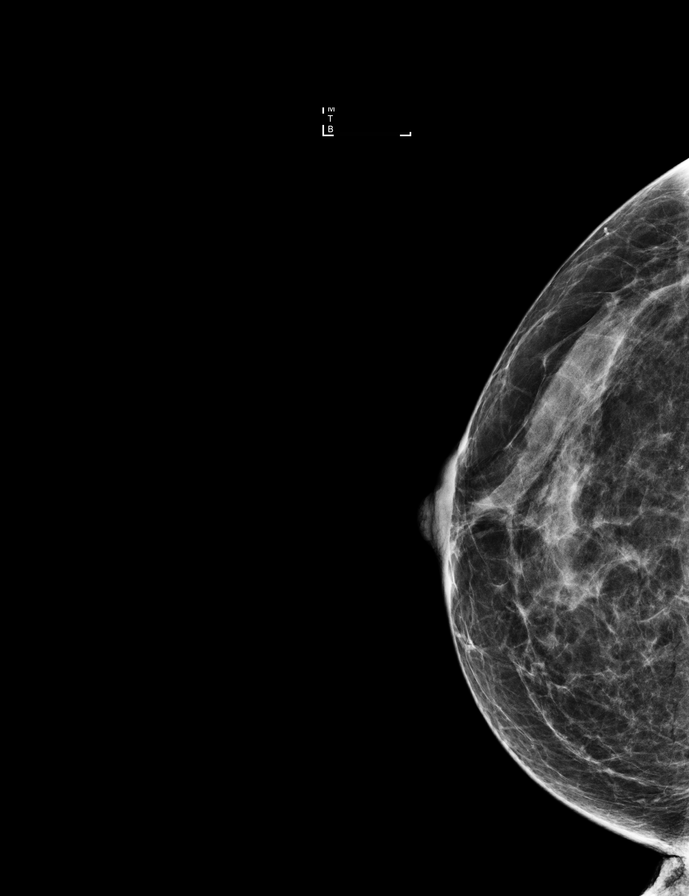

[R CC synth-2D]
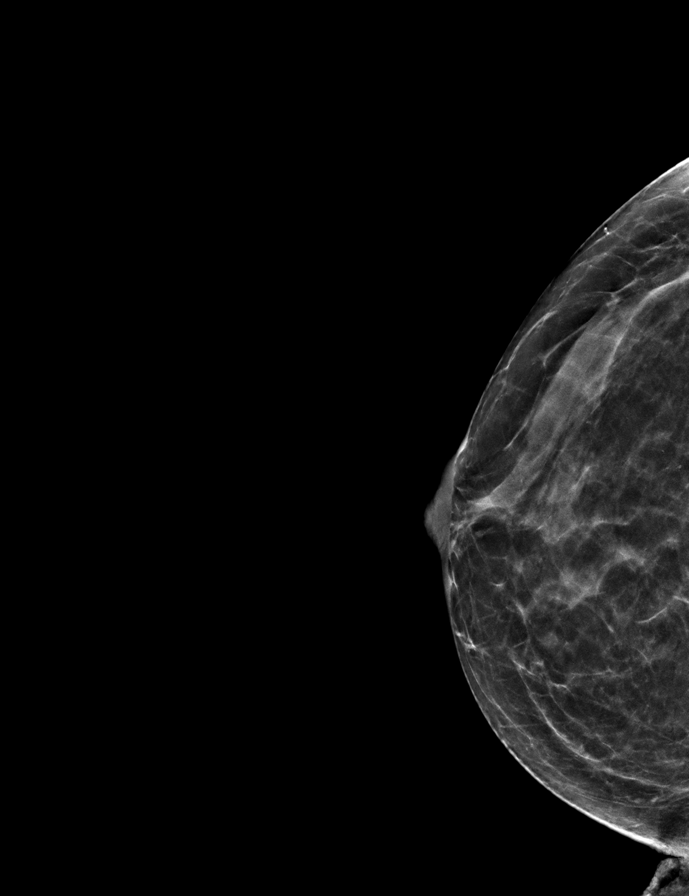

[L CC synth-2D]
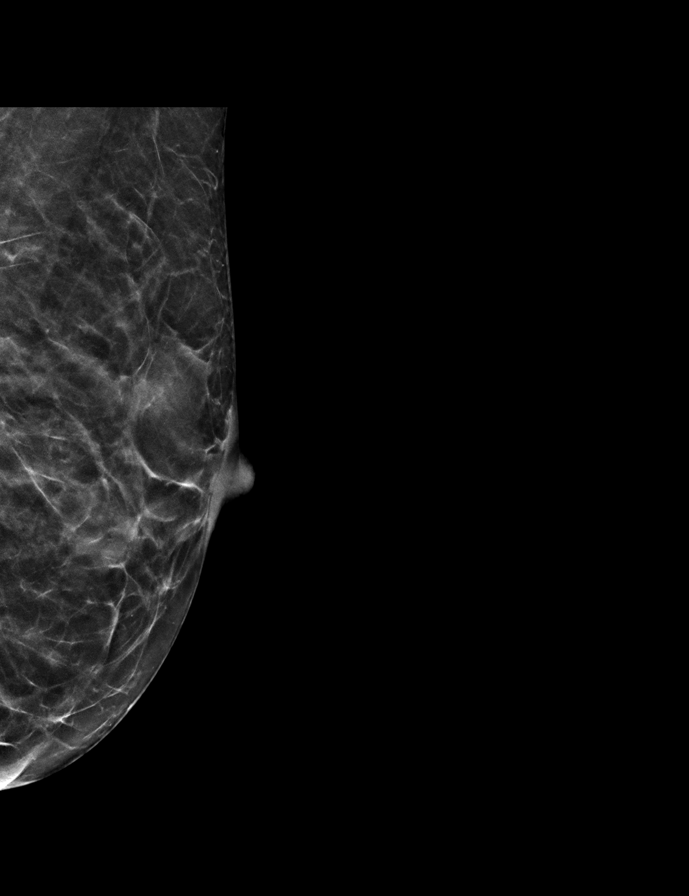

[9 of 32 positions shown; findings below may reference images not displayed]

ACR Breast Density Category b: There are scattered areas of
fibroglandular density.
FINDINGS: There are no findings suspicious for malignancy. Bilateral
retropectoral silicone implants appear stable in position and
configuration. Images were processed with CAD.
IMPRESSION: No mammographic evidence of malignancy. A result letter of this
screening mammogram will be mailed directly to the patient.

RECOMMENDATION:
Screening mammogram in one year. (Code:[JM])

BI-RADS CATEGORY  2: Benign.

## 2015-12-25 ENCOUNTER — Encounter: Payer: Self-pay | Admitting: Obstetrics & Gynecology

## 2015-12-27 NOTE — Telephone Encounter (Signed)
Call to patient's Mother to discuss her concerns.  Will close this mychart encounter.  Telephone encounter opened for patient.

## 2016-02-10 ENCOUNTER — Other Ambulatory Visit: Payer: Self-pay | Admitting: Obstetrics & Gynecology

## 2016-02-12 NOTE — Telephone Encounter (Signed)
Medication refill request: Premarin  Last AEX:  [phone]  Next AEX: 03-29-16 Last MMG (if hormonal medication request): 12-01-15 Refill authorized: please advise

## 2016-03-28 NOTE — Progress Notes (Signed)
55 y.o. G1P1 MarriedCaucasianF here for annual exam.  Doing well.  Adjusting to changes with child this year.  Reports [place].  Declines pelvic exam today.  States she will come back for this if needed.  Denies vaginal bleeding.    D/W pt recent communication from rheumatology--Dr. Estanislado Pandy.  She has been diagnosed with Raynaud's and "autoimmune disorder" with positive anticardiolipin antibody.  Her lab work showed anticardiolipin antibody IgM at 46 and Beta 2 IgM positive at 100.  Most recent notes states pt is at risk for clotting and she is on a baby aspirin.  Suggestion made regarding HRT and risks.  Pt and I discussed this today.  She was not aware of any of this and is very tearful about possibility of needing to stop HRT.  She would like second opinion before stopping HRT.    Patient's last menstrual period was 06/25/2003.          Sexually active: Yes.    The current method of family planning is status post hysterectomy.    Exercising: Yes.    cardio, weights Smoker:  no  Health Maintenance: Pap:  08/22/10 negative History of abnormal Pap:  no MMG:  12/01/15 BIRADS 2 benign  Colonoscopy: 2011.  Follow up due last year.   BMD:   none TDaP:  10/01/11 Pneumonia vaccine(s):  2016  Zostavax:   never Hep C testing: drawn today Screening Labs: drawn today, Hb today: drawn today, Urine today: normal    reports that she has never smoked. She has never used smokeless tobacco. She reports that she drinks about 0.6 - 1.2 oz of alcohol per week . She reports that she does not use drugs.  Past Medical History:  Diagnosis Date  . 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 Fibroid   . Hand pain    left hand-had 2 cortisone injections  . Hx of migraines     Past Surgical History:  Procedure Laterality Date  . ABDOMINAL HYSTERECTOMY    . BREAST SURGERY    . PELVIC LAPAROSCOPY      Current Outpatient Prescriptions  Medication Sig Dispense Refill  . aspirin  81 MG tablet Take 81 mg by mouth daily.    . hydroxychloroquine (PLAQUENIL) 200 MG tablet 300 mg.     . Lutein 20 MG TABS Daily.  Unsure of mg dosage    . PREMARIN 0.625 MG tablet TAKE 1 TABLET (0.625 MG TOTAL) BY MOUTH DAILY. 90 tablet 0  . Biotin 1 MG CAPS Take by mouth daily.     No current facility-administered medications for this visit.     Family History  Problem Relation Age of Onset  . Clotting disorder Mother   . Prostate cancer Father   . Clotting disorder Father   . Cervical cancer Maternal Grandmother     ROS:  Pertinent items are noted in HPI.  Otherwise, a comprehensive ROS was negative.  Exam:   BP 124/80 (BP Location: Right Arm, Patient Position: Sitting, Cuff Size: Normal)   Pulse 64   Resp 14   Ht 5' 5.25" (1.657 m)   Wt 132 lb 6.4 oz (60.1 kg)   LMP 06/25/2003   BMI 21.86 kg/m    Height: 5' 5.25" (165.7 cm)  Ht Readings from Last 3 Encounters:  03/29/16 5' 5.25" (1.657 m)  02/03/15 5\' 6"  (1.676 m)  01/18/14 5\' 6"  (1.676 m)    General appearance: alert, cooperative  and appears stated age Head: Normocephalic, without obvious abnormality, atraumatic Neck: no adenopathy, supple, symmetrical, trachea midline and thyroid normal to inspection and palpation Lungs: clear to auscultation bilaterally Breasts: normal breasts without masses, skin changes, nipple discharge.  No LAD noted either. Heart: regular rate and rhythm Abdomen: soft, non-tender; bowel sounds normal; no masses,  no organomegaly Extremities: extremities normal, atraumatic, no cyanosis or edema Skin: Skin color, texture, turgor normal. No rashes or lesions Lymph nodes: Cervical, supraclavicular, and axillary nodes normal. No abnormal inguinal nodes palpated Neurologic: Grossly normal   Pelvic: pt declined due to just coming [place]  Chaperone was present for exam.  A:   Well Woman with normal exam  PMP on HRT  Recent testing showing antiphospholipid antibody with  increased clotting.  Pt does not want to be off HRT.  Will refer to Dr. Marin Olp for consultation H/O TAH 2005, ovaries remain  Family hx of macular degeneration (MGM and Mother).  Pt on Lutein Family hx of CVD (father with CABG this year) H/O bilateral breast reduction  P: Mammogram, d/w pt 3D imaging  No pap smear today. H/O TAH for benign indications. Pt will return for pelvic exam at later date. Premarin 0.625mg  qd #90/0 RF given.   Will refer to Dr. Marin Olp for second opinion regarding stopping HRT. Referral to GI for screening colonoscopy [place] obtained today. Hep C antibody obtained today. return annually or prn

## 2016-03-29 ENCOUNTER — Encounter: Payer: Self-pay | Admitting: Obstetrics & Gynecology

## 2016-03-29 ENCOUNTER — Ambulatory Visit (INDEPENDENT_AMBULATORY_CARE_PROVIDER_SITE_OTHER): Payer: 59 | Admitting: Obstetrics & Gynecology

## 2016-03-29 VITALS — BP 124/80 | HR 64 | Resp 14 | Ht 65.25 in | Wt 132.4 lb

## 2016-03-29 DIAGNOSIS — Z0000 Encounter for general adult medical examination without abnormal findings: Secondary | ICD-10-CM | POA: Diagnosis not present

## 2016-03-29 DIAGNOSIS — Z205 Contact with and (suspected) exposure to viral hepatitis: Secondary | ICD-10-CM

## 2016-03-29 DIAGNOSIS — R760 Raised antibody titer: Secondary | ICD-10-CM | POA: Insufficient documentation

## 2016-03-29 DIAGNOSIS — Z79890 Hormone replacement therapy (postmenopausal): Secondary | ICD-10-CM | POA: Diagnosis not present

## 2016-03-29 DIAGNOSIS — Z1211 Encounter for screening for malignant neoplasm of colon: Secondary | ICD-10-CM | POA: Diagnosis not present

## 2016-03-29 DIAGNOSIS — N951 Menopausal and female climacteric states: Secondary | ICD-10-CM

## 2016-03-29 DIAGNOSIS — Z01419 Encounter for gynecological examination (general) (routine) without abnormal findings: Secondary | ICD-10-CM

## 2016-03-29 HISTORY — DX: Raised antibody titer: R76.0

## 2016-03-29 LAB — POCT URINALYSIS DIPSTICK
Bilirubin, UA: NEGATIVE
Glucose, UA: NEGATIVE
Ketones, UA: NEGATIVE
Leukocytes, UA: NEGATIVE
NITRITE UA: NEGATIVE
PROTEIN UA: NEGATIVE
RBC UA: NEGATIVE
UROBILINOGEN UA: NEGATIVE
pH, UA: 5

## 2016-03-29 MED ORDER — ESTROGENS CONJUGATED 0.625 MG PO TABS: 0.6250 mg | ORAL_TABLET | Freq: Every day | ORAL | 0 refills | Status: DC

## 2016-03-30 LAB — FOLLICLE STIMULATING HORMONE: FSH: 43.2 m[IU]/mL

## 2016-03-30 LAB — HEPATITIS C ANTIBODY: HCV AB: NEGATIVE

## 2016-04-02 ENCOUNTER — Encounter: Payer: Self-pay | Admitting: Obstetrics & Gynecology

## 2016-05-10 ENCOUNTER — Other Ambulatory Visit: Payer: BLUE CROSS/BLUE SHIELD

## 2016-05-10 ENCOUNTER — Ambulatory Visit: Payer: BLUE CROSS/BLUE SHIELD

## 2016-05-10 ENCOUNTER — Ambulatory Visit: Payer: BLUE CROSS/BLUE SHIELD | Admitting: Hematology & Oncology

## 2016-05-29 ENCOUNTER — Encounter: Payer: Self-pay | Admitting: Obstetrics & Gynecology

## 2016-06-06 ENCOUNTER — Other Ambulatory Visit: Payer: BLUE CROSS/BLUE SHIELD

## 2016-06-06 ENCOUNTER — Ambulatory Visit: Payer: BLUE CROSS/BLUE SHIELD | Admitting: Hematology & Oncology

## 2016-06-06 ENCOUNTER — Ambulatory Visit: Payer: BLUE CROSS/BLUE SHIELD

## 2016-06-19 ENCOUNTER — Other Ambulatory Visit: Payer: Self-pay | Admitting: Rheumatology

## 2016-06-20 ENCOUNTER — Telehealth: Payer: Self-pay | Admitting: Rheumatology

## 2016-06-20 NOTE — Telephone Encounter (Signed)
-----   Message from Carole Binning, LPN sent at [id number]  9:36 AM EST ----- Regarding: Please schedule follow up visit Please schedule follow up visit. Patient due in March 2018. Thanks!

## 2016-06-20 NOTE — Telephone Encounter (Signed)
LMOM for patient to call back to schedule appointment.  

## 2016-06-20 NOTE — Telephone Encounter (Signed)
Last Visit: 02/28/16 Next Visit due in March 2018. Message sent to front to schedule patient.  Labs: 02/19/16 WNL PLQ Eye Exam: 02/20/16 WNL  Okay to refill PLQ?

## 2016-06-25 ENCOUNTER — Other Ambulatory Visit: Payer: Self-pay | Admitting: Family

## 2016-06-26 ENCOUNTER — Ambulatory Visit: Payer: 59

## 2016-06-26 ENCOUNTER — Ambulatory Visit (HOSPITAL_BASED_OUTPATIENT_CLINIC_OR_DEPARTMENT_OTHER): Payer: 59 | Admitting: Hematology & Oncology

## 2016-06-26 ENCOUNTER — Other Ambulatory Visit: Payer: 59

## 2016-06-26 VITALS — BP 128/45 | HR 58 | Temp 97.6°F | Resp 20 | Wt 135.8 lb

## 2016-06-26 DIAGNOSIS — R760 Raised antibody titer: Secondary | ICD-10-CM

## 2016-06-26 DIAGNOSIS — Z8042 Family history of malignant neoplasm of prostate: Secondary | ICD-10-CM

## 2016-06-26 DIAGNOSIS — Z832 Family history of diseases of the blood and blood-forming organs and certain disorders involving the immune mechanism: Secondary | ICD-10-CM

## 2016-06-26 DIAGNOSIS — Z808 Family history of malignant neoplasm of other organs or systems: Secondary | ICD-10-CM | POA: Diagnosis not present

## 2016-06-26 NOTE — Progress Notes (Signed)
Referral MD  Reason for Referral: (+) Anti-cardiolipin Antibodies -- NO h/o DVT/PE  Chief Complaint  Patient presents with  . Initial Assessment    "Clotting situation".  : I will like to stay on Premarin.  HPI: Alexandra Garner is a very charming 56 year old white female. She is very interesting. 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 She travels somewhat frequently.  She had a hysterectomy back about 13 years ago. She had her ovaries left in. She has been on Premarin without any problems.  She recently has been found to have some rheumatologic issues. She sees a rheumatologist. The rheumatologist ran a battery of studies. One of the studies was anti-cardiolipin antibodies. This was done even though there is no evidence of the patient having any thromboembolic disease. I think there was maybe an episode of thrombi embolism and her mother after surgery.  She was found to have an elevated IgM anti-cardiolipin antibody of 46. She had a elevated beta-2 glycoprotein IgM of 100.  She is on baby aspirin. She is doing well on baby aspirin. She exercises. She works out. She drinks a lot of water.  She does not smoke. She has no diabetes. She's had multiple surgeries in the past without any difficulties.  The rheumatologist thought that she should be off Premarin. Alexandra Garner saw her gynecologist. Dr. Sabra Heck, her gynecologist, felt that a a pain if from [place] would help with decision-making.  She has had one pregnancy. She's had no miscarriages. Her daughter is now 47 years old. She is looking into going to college.  Alexandra Garner had her last mammogram a year or so ago.  Overall, her performance status is ECOG 0.     Past Medical History:  Diagnosis Date  . 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 Fibroid   . Hand pain    left hand-had 2 cortisone injections  . Hx of migraines   :  Past Surgical History:  Procedure Laterality Date  . ABDOMINAL HYSTERECTOMY    . BREAST SURGERY     . PELVIC LAPAROSCOPY    :   Current Outpatient Prescriptions:  .  aspirin 81 MG tablet, Take 81 mg by mouth daily., Disp: , Rfl:  .  doxycycline (VIBRAMYCIN) 100 MG capsule, TAKE 1 CAPSULE BY MOUTH TWICE A DAY FOR TEN DAYS AS DIRECTED, Disp: , Rfl: 0 .  estrogens, conjugated, (PREMARIN) 0.625 MG tablet, Take 1 tablet (0.625 mg total) by mouth daily., Disp: 90 tablet, Rfl: 0 .  hydroxychloroquine (PLAQUENIL) 200 MG tablet, TAKE 1 TABLET BY MOUTH EVERY MORNING AND TAKE 1/2 TABLET AT NIGHT (Patient taking differently: Takes 300mg  at hs.), Disp: 135 tablet, Rfl: 0 .  Lutein 20 MG TABS, Daily.  Unsure of mg dosage, Disp: , Rfl:  .  mupirocin ointment (BACTROBAN) 2 %, APPLY TO AFFECTED AREA IN NOSE 2 TIMES A DAY FOR 7 DAYS AS DIRECTED, Disp: , Rfl: 0:  :  Allergies  Allergen Reactions  . Penicillins Shortness Of Breath and Swelling    Mouth swells and it gets hard to breathe   . Latex   . Adhesive [Tape] Other (See Comments)    blisters  :  Family History  Problem Relation Age of Onset  . Clotting disorder Mother   . Prostate cancer Father   . Clotting disorder Father   . Cervical cancer Maternal Grandmother   :  Social History   Social History  . Marital status: Married  Spouse name: N/A  . Number of children: N/A  . Years of education: N/A   Occupational History  . Not on file.   Social History Main Topics  . Smoking status: Never Smoker  . Smokeless tobacco: Never Used  . Alcohol use 0.6 - 1.2 oz/week    1 - 2 Standard drinks or equivalent per week  . Drug use: No  . Sexual activity: Yes    Partners: Male    Birth control/ protection: None, Surgical     Comment: TAH   Other Topics Concern  . Not on file   Social History Narrative  . No narrative on file  :  Pertinent items are noted in HPI.  Exam: @IPVITALS @  Well-developed and well-nourished white female in no obvious distress. Vital signs are temperature of 97.6. Pulse 58. Blood pressure 128/45.  Weight is 136 pounds. Head and neck exam shows no ocular or oral lesions. She has no palpable cervical or supraclavicular lymph nodes. Lungs are clear bilaterally. Cardiac exam regular rate and rhythm with no murmurs, rubs or bruits. Abdomen is soft. She has good bowel sounds. There is no fluid wave. There is no palpable abdominal mass. There is no palpable liver or spleen tip. Back exam shows no tenderness over the spine, ribs or hips. Extremities shows no clubbing, cyanosis or edema. No venous cords palpable in her lower legs. She has a negative Homans sign. Skin exam shows no rashes, ecchymoses or petechia. Neurological exam shows no focal neurological deficits.   No results for input(s): NA, K, CL, CO2, GLUCOSE, BUN, CREATININE, CALCIUM in the last 72 hours.  Blood smear review:  None  Pathology: None     Assessment and Plan:  Alexandra Garner is a 56 year old white female. She's never had a thromboembolic event. She's been on Premarin for 13 years. She has elevated IgM anticardiolipin antibodies.  I don't see a problem with her staying on Premarin. Premarin clearly has fits for her. It helps her quality of life. It keeps her active and functional.  I think that she probably should double the baby aspirin to 162 mg daily. I told her take both tablets at the same time.  I also told her to make sure that she drinks a lot of water. Particularly when she travels, I think water is a very viable for her.  She really has minimized her other exogenous risk factors. She does not smoke. She does not have diabetes. She has no cholesterol issues. She exercises. Other all these are very favorable for her.  There is always a risk of thromboembolic disease with Premarin. Again, just having the elevated antibodies without a clinical history of thromboembolic disease I don't believe should be a contraindication for her being on Premarin.  I spent about 45 minutes with her. She is very nice. She understands all  that I was telling her. I answered all of her questions.  I don't think we have to see her back in the office. I would be more than happy to see her back if any problems arise or if any other questions,.

## 2016-07-15 ENCOUNTER — Telehealth: Payer: Self-pay | Admitting: Rheumatology

## 2016-07-15 DIAGNOSIS — Z79899 Other long term (current) drug therapy: Secondary | ICD-10-CM

## 2016-07-15 NOTE — Telephone Encounter (Signed)
Please release lab orders for [facility]. Patient is going tomorrow or Wednesday for lab work.

## 2016-07-16 NOTE — Telephone Encounter (Signed)
Order for labs placed in the computer.

## 2016-07-17 DIAGNOSIS — Z79899 Other long term (current) drug therapy: Secondary | ICD-10-CM | POA: Diagnosis not present

## 2016-07-18 ENCOUNTER — Other Ambulatory Visit: Payer: Self-pay | Admitting: *Deleted

## 2016-07-18 DIAGNOSIS — R300 Dysuria: Secondary | ICD-10-CM

## 2016-07-18 DIAGNOSIS — M2550 Pain in unspecified joint: Secondary | ICD-10-CM

## 2016-07-18 LAB — COMPREHENSIVE METABOLIC PANEL
A/G RATIO: 2.3 — AB (ref 1.2–2.2)
ALBUMIN: 4.3 g/dL (ref 3.5–5.5)
ALT: 18 IU/L (ref 0–32)
AST: 18 IU/L (ref 0–40)
Alkaline Phosphatase: 30 IU/L — ABNORMAL LOW (ref 39–117)
BILIRUBIN TOTAL: 0.4 mg/dL (ref 0.0–1.2)
BUN / CREAT RATIO: 14 (ref 9–23)
BUN: 10 mg/dL (ref 6–24)
CO2: 24 mmol/L (ref 18–29)
CREATININE: 0.74 mg/dL (ref 0.57–1.00)
Calcium: 9.3 mg/dL (ref 8.7–10.2)
Chloride: 101 mmol/L (ref 96–106)
GFR calc Af Amer: 106 mL/min/{1.73_m2} (ref >59)
GFR calc non Af Amer: 92 mL/min/{1.73_m2} (ref >59)
Globulin, Total: 1.9 g/dL (ref 1.5–4.5)
Glucose: 141 mg/dL — ABNORMAL HIGH (ref 65–99)
POTASSIUM: 3.8 mmol/L (ref 3.5–5.2)
SODIUM: 141 mmol/L (ref 134–144)
Total Protein: 6.2 g/dL (ref 6.0–8.5)

## 2016-07-18 LAB — CBC WITH DIFFERENTIAL/PLATELET
BASOS: 0 %
Basophils Absolute: 0 10*3/uL (ref 0.0–0.2)
EOS (ABSOLUTE): 0.1 10*3/uL (ref 0.0–0.4)
Eos: 1 %
HEMOGLOBIN: 12.6 g/dL (ref 11.1–15.9)
Hematocrit: 36.6 % (ref 34.0–46.6)
Immature Grans (Abs): 0 10*3/uL (ref 0.0–0.1)
Immature Granulocytes: 0 %
Lymphocytes Absolute: 1.5 10*3/uL (ref 0.7–3.1)
Lymphs: 22 %
MCH: 28.6 pg (ref 26.6–33.0)
MCHC: 34.4 g/dL (ref 31.5–35.7)
MCV: 83 fL (ref 79–97)
Monocytes Absolute: 0.3 10*3/uL (ref 0.1–0.9)
Monocytes: 5 %
NEUTROS ABS: 4.8 10*3/uL (ref 1.4–7.0)
Neutrophils: 72 %
Platelets: 261 10*3/uL (ref 150–379)
RBC: 4.41 x10E6/uL (ref 3.77–5.28)
RDW: 13.6 % (ref 12.3–15.4)
WBC: 6.7 10*3/uL (ref 3.4–10.8)

## 2016-07-18 NOTE — Telephone Encounter (Signed)
Mild elevation of glucose rest negative

## 2016-07-29 DIAGNOSIS — R300 Dysuria: Secondary | ICD-10-CM | POA: Diagnosis not present

## 2016-07-29 DIAGNOSIS — M2550 Pain in unspecified joint: Secondary | ICD-10-CM | POA: Diagnosis not present

## 2016-07-31 LAB — MICROSCOPIC EXAMINATION: Casts: NONE SEEN /lpf

## 2016-07-31 LAB — URINALYSIS, ROUTINE W REFLEX MICROSCOPIC
Bilirubin, UA: NEGATIVE
Glucose, UA: NEGATIVE
Ketones, UA: NEGATIVE
Nitrite, UA: NEGATIVE
Protein, UA: NEGATIVE
RBC, UA: NEGATIVE
Specific Gravity, UA: 1.024 (ref 1.005–1.030)
Urobilinogen, Ur: 0.2 mg/dL (ref 0.2–1.0)
pH, UA: 5 (ref 5.0–7.5)

## 2016-07-31 LAB — CARDIOLIPIN ANTIBODIES, IGG, IGM, IGA
Anticardiolipin IgA: <9 APL U/mL (ref 0–11)
Anticardiolipin IgG: <9 GPL U/mL (ref 0–14)
Anticardiolipin IgM: 44 MPL U/mL — ABNORMAL HIGH (ref 0–12)

## 2016-07-31 LAB — ANTI-DNA ANTIBODY, DOUBLE-STRANDED: dsDNA Ab: 1 IU/mL (ref 0–9)

## 2016-07-31 LAB — SEDIMENTATION RATE: Sed Rate: 2 mm/hr (ref 0–40)

## 2016-08-02 ENCOUNTER — Encounter: Payer: Self-pay | Admitting: Obstetrics & Gynecology

## 2016-08-02 MED ORDER — SULFAMETHOXAZOLE-TRIMETHOPRIM 800-160 MG PO TABS: 1.0000 | ORAL_TABLET | Freq: Two times a day (BID) | ORAL | 0 refills | Status: DC

## 2016-08-02 MED ORDER — FLUCONAZOLE 150 MG PO TABS: 150.0000 mg | ORAL_TABLET | Freq: Once | ORAL | 0 refills | Status: AC

## 2016-08-08 ENCOUNTER — Telehealth: Payer: Self-pay | Admitting: *Deleted

## 2016-08-12 ENCOUNTER — Ambulatory Visit (INDEPENDENT_AMBULATORY_CARE_PROVIDER_SITE_OTHER): Payer: 59

## 2016-08-12 ENCOUNTER — Telehealth: Payer: Self-pay | Admitting: Obstetrics & Gynecology

## 2016-08-12 VITALS — BP 110/70 | HR 60 | Resp 12 | Ht 65.25 in | Wt 134.0 lb

## 2016-08-12 DIAGNOSIS — Z87448 Personal history of other diseases of urinary system: Secondary | ICD-10-CM

## 2016-08-12 DIAGNOSIS — Z87440 Personal history of urinary (tract) infections: Secondary | ICD-10-CM

## 2016-08-12 NOTE — Progress Notes (Signed)
Patient here for TOC of UTI. Patient states she feels better.  Urine culture sent for testing.  Routed to provider and encounter closed.

## 2016-08-12 NOTE — Telephone Encounter (Signed)
Patient thinks she needs to come in for a urine test.  States she had UTI about two weeks ago and was on three day antibiotic and thinks dr Sabra Heck may want her to come back in.

## 2016-08-12 NOTE — Telephone Encounter (Signed)
Spoke with patient. Patient states she had spoken with Dr. Sabra Heck via [place] on 08/02/16. Patient states she was prescribed bactrim and diflucan for UTI. Patient states she was advised to return for a repeat UA. See MyChart encounter dated 08/02/16. Patient scheduled for nurse visit 08/12/16 at 2pm. Patient states she is feeling better, denies any urinary symptoms at this time. Patient states her nasal symptoms are starting to return, reports she will be following up with pcp for nasal symptoms. Patient is agreeable to date and time.   Routing to provider for final review. Patient is agreeable to disposition. Will close encounter.

## 2016-08-13 ENCOUNTER — Other Ambulatory Visit: Payer: Self-pay | Admitting: Obstetrics & Gynecology

## 2016-08-13 NOTE — Telephone Encounter (Signed)
Medication refill request: Premarin  Last AEX:  03/29/16 SM Next AEX: 07/17/17 Last MMG (if hormonal medication request): 11/30/15 BIRADS 2 benign Refill authorized: 03/29/16 #90 w/0 refills; today please advise

## 2016-08-14 LAB — URINE CULTURE: Organism ID, Bacteria: NO GROWTH

## 2016-08-20 NOTE — Progress Notes (Deleted)
Office Visit Note  Patient: Alexandra Garner             Date of Birth: 12-08-1960           MRN: [id number]             PCP: No PCP Per Patient Referring: No ref. provider found Visit Date: 09/02/2016 Occupation: @GUAROCC @    Subjective:  No chief complaint on file.   History of Present Illness: Alexandra Garner is a 56 y.o. female ***   Activities of Daily Living:  Patient reports morning stiffness for *** {minute/hour:19697}.   Patient {ACTIONS;DENIES/REPORTS:21021675::"Denies"} nocturnal pain.  Difficulty dressing/grooming: {ACTIONS;DENIES/REPORTS:21021675::"Denies"} Difficulty climbing stairs: {ACTIONS;DENIES/REPORTS:21021675::"Denies"} Difficulty getting out of chair: {ACTIONS;DENIES/REPORTS:21021675::"Denies"} Difficulty using hands for taps, buttons, cutlery, and/or writing: {ACTIONS;DENIES/REPORTS:21021675::"Denies"}   No Rheumatology ROS completed.   PMFS History:  Patient Active Problem List   Diagnosis Date Noted  . 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  . Autoimmune disorder ([place]) 02/03/2015  . Family history of macular degeneration 02/03/2015  . FLATULENCE-GAS-BLOATING 11/29/2008  . HEMORRHOIDS-INTERNAL 11/01/2008  . HEMORRHOIDS-EXTERNAL 11/01/2008  . PERSONAL HX COLONIC POLYPS 11/01/2008  . RECTAL BLEEDING 01/04/2008  . ABDOMINAL PAIN-GENERALIZED 01/04/2008    Past Medical History:  Diagnosis Date  . 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 Fibroid   . Hand pain    left hand-had 2 cortisone injections  . Hx of migraines     Family History  Problem Relation Age of Onset  . Clotting disorder Mother   . Prostate cancer Father   . Clotting disorder Father   . Cervical cancer Maternal Grandmother    Past Surgical History:  Procedure Laterality Date  . ABDOMINAL HYSTERECTOMY    . BREAST SURGERY    . PELVIC LAPAROSCOPY     Social History   Social History Narrative  . No narrative on file     Objective: Vital Signs: LMP  10/11/2012    Physical Exam   Musculoskeletal Exam: ***  CDAI Exam: No CDAI exam completed.    Investigation: Findings:   July 2015:  Uric acid was 4.3.  ANA was 1:240 nuclear homogenous pattern.  Sed rate was normal at 11.  Rheumatoid factor was negative.   04/27/2014  I obtained x-rays of bilateral hands, 2 views today, which were within normal limits with mild PIP narrowing.  No erosive changes or extra-articular osteopenia was noted.  Bilateral feet x-rays showed bilateral 1st MTP narrowing without any erosive changes.   05/04/2014   After informed consent was obtained per EULAR recommendation, ultrasound examination of the bilateral hands was performed.  We used 12 MHz transducer, grayscale and power Doppler to look at bilateral 2nd, 3rd and 5th MCP joints and bilateral wrist joints to evaluate for any underlying tenosynovitis or synovitis.  The findings were she had synovitis on volar aspect of the right and left 2nd, 3rd and 5th MCP joint and there was synovitis of bilateral wrist joints.  These findings were consistent with inflammatory arthritis.  Her bilateral median nerves were within normal limits, right being 0.08 cm square and left being 0.07 cm square.  I discussed the findings at length with Ms. Pasion.     We also reviewed her labs briefly.  She has positive anticardiolipin antibody and beta 2 antibody and significant titer so I have advised her to take a baby aspirin a day.  I have given her a handout on Plaquenil which we can discuss next visit.  Labs from August 01, 2015 shows CMP with GFR normal, CBC with diff is normal.   08/25/2015 X-rays of bilateral knees show medical compartmental narrowing bilaterally.  No patellofemoral joint space narrowing.     Imaging: No results found.  Speciality Comments: No specialty comments available.    Procedures:  No procedures performed Allergies: Penicillins; Latex; and Adhesive [tape]   Assessment / Plan:     Visit  Diagnoses: Antiphospholipid antibody positive  Raynaud's disease without gangrene  Primary osteoarthritis of both hands  High risk medication use - Plaquenil    Orders: No orders of the defined types were placed in this encounter.  No orders of the defined types were placed in this encounter.   Face-to-face time spent with patient was *** minutes. 50% of time was spent in counseling and coordination of care.  Follow-Up Instructions: No Follow-up on file.   Amy Littrell, RT  Note - This record has been created using [employer].  Chart creation errors have been sought, but may not always  have been located. Such creation errors do not reflect on  the standard of medical care.

## 2016-08-27 NOTE — Telephone Encounter (Signed)
error 

## 2016-09-02 ENCOUNTER — Ambulatory Visit: Payer: 59 | Admitting: Rheumatology

## 2016-09-19 DIAGNOSIS — M19071 Primary osteoarthritis, right ankle and foot: Secondary | ICD-10-CM | POA: Insufficient documentation

## 2016-09-19 DIAGNOSIS — M19072 Primary osteoarthritis, left ankle and foot: Secondary | ICD-10-CM

## 2016-09-19 DIAGNOSIS — I7300 Raynaud's syndrome without gangrene: Secondary | ICD-10-CM | POA: Insufficient documentation

## 2016-09-19 NOTE — Progress Notes (Deleted)
Office Visit Note  Patient: Alexandra Garner             Date of Birth: Jul 14, 1960           MRN: [id number]             PCP: No PCP Per Patient Referring: No ref. provider found Visit Date: 09/30/2016 Occupation: _0 @    Subjective:  No chief complaint on file.   History of Present Illness: Alexandra Garner is a 56 y.o. female ***   Activities of Daily Living:  Patient reports morning stiffness for *** {minute/hour:19697}.   Patient {ACTIONS;DENIES/REPORTS:21021675::"Denies"} nocturnal pain.  Difficulty dressing/grooming: {ACTIONS;DENIES/REPORTS:21021675::"Denies"} Difficulty climbing stairs: {ACTIONS;DENIES/REPORTS:21021675::"Denies"} Difficulty getting out of chair: {ACTIONS;DENIES/REPORTS:21021675::"Denies"} Difficulty using hands for taps, buttons, cutlery, and/or writing: {ACTIONS;DENIES/REPORTS:21021675::"Denies"}   No Rheumatology ROS completed.   PMFS History:  Patient Active Problem List   Diagnosis Date Noted  . High risk medication use 09/26/2016  . Positive cardiolipin antibodies 09/19/2016  . Raynaud's disease without gangrene 09/19/2016  . Primary osteoarthritis of both hands 09/19/2016  . Primary osteoarthritis of both feet 09/19/2016  . 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  . Autoimmune disorder ([place]) 02/03/2015  . Family history of macular degeneration 02/03/2015  . FLATULENCE-GAS-BLOATING 11/29/2008  . HEMORRHOIDS-INTERNAL 11/01/2008  . HEMORRHOIDS-EXTERNAL 11/01/2008  . PERSONAL HX COLONIC POLYPS 11/01/2008  . RECTAL BLEEDING 01/04/2008  . ABDOMINAL PAIN-GENERALIZED 01/04/2008    Past Medical History:  Diagnosis Date  . 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 Fibroid   . Hand pain    left hand-had 2 cortisone injections  . Hx of migraines     Family History  Problem Relation Age of Onset  . Clotting disorder Mother   . Prostate cancer Father   . Clotting disorder Father   . Cervical cancer Maternal Grandmother     Past Surgical History:  Procedure Laterality Date  . ABDOMINAL HYSTERECTOMY    . BREAST SURGERY    . PELVIC LAPAROSCOPY     Social History   Social History Narrative  . No narrative on file     Objective: Vital Signs: LMP 10/11/2012    Physical Exam   Musculoskeletal Exam: ***  CDAI Exam: No CDAI exam completed.    Investigation: Findings:  July 2015:  Uric acid was 4.3.  ANA was 1:240 nuclear homogenous pattern.  Sed rate was normal at 11.  Rheumatoid factor was negative.   04/27/2014  I obtained x-rays of bilateral hands, 2 views today, which were within normal limits with mild PIP narrowing.  No erosive changes or extra-articular osteopenia was noted.  Bilateral feet x-rays showed bilateral 1st MTP narrowing without any erosive changes.  05/04/2014 After informed consent was obtained per EULAR recommendation, ultrasound examination of the bilateral hands was performed.  We used 12 MHz transducer, grayscale and power Doppler to look at bilateral 2nd, 3rd and 5th MCP joints and bilateral wrist joints to evaluate for any underlying tenosynovitis or synovitis.  The findings were she had synovitis on volar aspect of the right and left 2nd, 3rd and 5th MCP joint and there was synovitis of bilateral wrist joints.  These findings were consistent with inflammatory arthritis.  Her bilateral median nerves were within normal limits, right being 0.08 cm square and left being 0.07 cm square.  Her labs from November 2015:  CBC, comprehensive metabolic panel, and UA were within normal limits.  Her PAN ANCA, ANA, complements, ENA, CCP, HLA-B27, hep panel, G6PD, and SPEP  were normal.  Lupus anticoagulant was negative.  Her anticardiolipin IgM was significantly positive at 46.  Beta-2 IGM was positive at 100, and beta-2 IGA was 23.   07/21/2016 CBC normal, CMP normal In 07/29/2016 dsDNA 1, ESR 2, UA negative, anticardiolipin IgM 46    Imaging: No results found.  Speciality Comments: No  specialty comments available.    Procedures:  No procedures performed Allergies: Penicillins; Latex; and Adhesive [tape]   Assessment / Plan:     Visit Diagnoses: Autoimmune disorder (HCC)  Positive cardiolipin antibodies -   Her anticardiolipin IgM was significantly positive at 46.  Beta-2 IGM was positive at 100, and beta-2 IGA was 23.   Raynaud's disease without gangrene  High risk medication use - Plaquenil 200 mg 1 tablet a.m. and half tablet p.m.  Primary osteoarthritis of both hands  Primary osteoarthritis of both feet   She also has has Hemorrhoids internal/ and external, Rectal bleeding, hx of Colonic Polyps,  Family history of macular degeneration; and Antiphospholipid antibody positive on her problem list.  Orders: No orders of the defined types were placed in this encounter.  No orders of the defined types were placed in this encounter.   Face-to-face time spent with patient was *** minutes. 50% of time was spent in counseling and coordination of care.  Follow-Up Instructions: No Follow-up on file.   Bo Merino, MD  Note - This record has been created using Editor, commissioning.  Chart creation errors have been sought, but may not always  have been located. Such creation errors do not reflect on  the standard of medical care.

## 2016-09-26 ENCOUNTER — Other Ambulatory Visit: Payer: Self-pay | Admitting: Rheumatology

## 2016-09-26 DIAGNOSIS — Z79899 Other long term (current) drug therapy: Secondary | ICD-10-CM | POA: Insufficient documentation

## 2016-09-26 NOTE — Telephone Encounter (Signed)
Last Visit: 02/28/16 Next Visit 09/30/16 Labs: 02/19/16 WNL PLQ Eye Exam: 07/17/16 Mildly elevated glucose  Okay to refill PLQ?

## 2016-09-30 ENCOUNTER — Ambulatory Visit (INDEPENDENT_AMBULATORY_CARE_PROVIDER_SITE_OTHER): Payer: 59 | Admitting: Rheumatology

## 2016-10-18 NOTE — Progress Notes (Signed)
Office Visit Note  Patient: Alexandra Garner             Date of Birth: 1961-04-18           MRN: [id number]             PCP: No PCP Per Patient Referring: No ref. provider found Visit Date: 10/24/2016 Occupation: _0 @    Subjective:  Left hand pain   History of Present Illness: Alexandra Garner is a 56 y.o. female    Left 2nd MCP 2 weeks ago for 10 days and has since resolved  No swelling  Bilateral 1st MTP discomfort with lounges  Skin thinning in fingertips  Plaquenil no refills  Eye exam due this month   2 baby aspirins daily  Staying premarin   Activities of Daily Living:  Patient reports morning stiffness for 5 minutes.   Patient Denies nocturnal pain.  Difficulty dressing/grooming: Denies Difficulty climbing stairs: Denies Difficulty getting out of chair: Denies Difficulty using hands for taps, buttons, cutlery, and/or writing: Denies   Review of Systems  Constitutional: Negative for fatigue, weight gain, weight loss and weakness.  HENT: Negative for mouth sores and mouth dryness.   Eyes: Negative for pain, redness and dryness.  Respiratory: Negative for cough, shortness of breath and difficulty breathing.   Cardiovascular: Positive for palpitations. Negative for chest pain, hypertension, irregular heartbeat and swelling in legs/feet.  Gastrointestinal: Negative for constipation, diarrhea and vomiting.  Genitourinary: Negative for painful urination.  Musculoskeletal: Positive for arthralgias and joint pain. Negative for joint swelling, muscle weakness and muscle tenderness.  Skin: Negative for color change, rash, hair loss, skin tightness and ulcers.  Hematological: Negative for swollen glands.  Psychiatric/Behavioral: Negative for depressed mood and sleep disturbance. The patient is not nervous/anxious.     PMFS History:  Patient Active Problem List   Diagnosis Date Noted  . Autoimmune disease ([place]) 10/20/2016  . High risk medication use 09/26/2016  .  Positive cardiolipin antibodies 09/19/2016  . Raynaud's disease without gangrene 09/19/2016  . Primary osteoarthritis of both hands 09/19/2016  . Primary osteoarthritis of both feet 09/19/2016  . 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  . Family history of macular degeneration 02/03/2015  . FLATULENCE-GAS-BLOATING 11/29/2008  . HEMORRHOIDS-INTERNAL 11/01/2008  . HEMORRHOIDS-EXTERNAL 11/01/2008  . PERSONAL HX COLONIC POLYPS 11/01/2008  . RECTAL BLEEDING 01/04/2008  . ABDOMINAL PAIN-GENERALIZED 01/04/2008    Past Medical History:  Diagnosis Date  . 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 Fibroid   . Hand pain    left hand-had 2 cortisone injections  . Hx of migraines     Family History  Problem Relation Age of Onset  . Clotting disorder Mother   . Prostate cancer Father   . Clotting disorder Father   . Cervical cancer Maternal Grandmother    Past Surgical History:  Procedure Laterality Date  . ABDOMINAL HYSTERECTOMY    . BREAST SURGERY    . PELVIC LAPAROSCOPY     Social History   Social History Narrative  . No narrative on file     Objective: Vital Signs: BP (!) 100/58   Pulse 62   Resp 16   Wt 134 lb (60.8 kg)   LMP 10/11/2012   BMI 22.13 kg/m    Physical Exam  Constitutional: She is oriented to person, place, and time. She appears well-developed and well-nourished.  HENT:  Head: Normocephalic and atraumatic.  Eyes: Conjunctivae and EOM are normal.  Pterygium noted  in bilateral eyes  Neck: Normal range of motion.  Cardiovascular: Normal rate, regular rhythm, normal heart sounds and intact distal pulses.   Pulmonary/Chest: Effort normal and breath sounds normal.  Abdominal: Soft. Bowel sounds are normal.  Lymphadenopathy:    She has no cervical adenopathy.  Neurological: She is alert and oriented to person, place, and time.  Skin: Skin is warm and dry. Capillary refill takes less than 2 seconds.  Psychiatric: She has a normal mood and  affect. Her behavior is normal.  Nursing note and vitals reviewed.    Musculoskeletal Exam: C-spine and thoracic lumbar spine good range of motion. Shoulder joints elbow joints wrist joint MCPs PIPs DIPs with good range of motion with no synovitis hip joints knee joints ankles MTPs PIPs DIPs are good range of motion with no synovitis.  CDAI Exam: No CDAI exam completed.    Investigation: Findings:  July 2015:  Uric acid was 4.3.  ANA was 1:640 nuclear homogenous pattern.  Sed rate was normal at 11.  Rheumatoid factor was negative.   04/27/2014  I obtained x-rays of bilateral hands, 2 views today, which were within normal limits with mild PIP narrowing.  No erosive changes or extra-articular osteopenia was noted.  Bilateral feet x-rays showed bilateral 1st MTP narrowing without any erosive changes.  05/04/2014 After informed consent was obtained per EULAR recommendation, ultrasound examination of the bilateral hands was performed.  We used 12 MHz transducer, grayscale and power Doppler to look at bilateral 2nd, 3rd and 5th MCP joints and bilateral wrist joints to evaluate for any underlying tenosynovitis or synovitis.  The findings were she had synovitis on volar aspect of the right and left 2nd, 3rd and 5th MCP joint and there was synovitis of bilateral wrist joints.  These findings were consistent with inflammatory arthritis.  Her bilateral median nerves were within normal limits, right being 0.08 cm square and left being 0.07 cm square.  Her labs from November 2015:  CBC, comprehensive metabolic panel, and UA were within normal limits.  Her PAN ANCA, ANA, complements, ENA, CCP, HLA-B27, hep panel, G6PD, and SPEP were normal.  Lupus anticoagulant was negative.  Her anticardiolipin IgM was significantly positive at 46.  Beta-2 IGM was positive at 100, and beta-2 IGA was 23.   07/21/2016 CBC normal, CMP normal In 07/29/2016 dsDNA 1, ESR 2, UA negative, anticardiolipin IgM 46    Imaging: No  results found.  Speciality Comments: No specialty comments available.    Procedures:  No procedures performed Allergies: Penicillins; Latex; and Adhesive [tape]   Assessment / Plan:     Visit Diagnoses: Autoimmune disease ([place]) - History of arthralgias, [name], positive aCL IgM46, positive beta 2 100. She had recent flare with increased pain and swelling in her left second PIP joint which is resolved now. She had no synovitis on examination today. She's been doing quite well on Plaquenil. Her hands were cold but did not have any discoloration.  High risk medication use - Plaquenil 200 mg by mouth, aspirin 81 mg, 2 tablets by mouth daily. We'll check labs today.   Raynaud's disease without gangrene: Is not very active currently  Antiphospholipid antibody positive,Positive cardiolipin antibodies: She seen Dr. and ever and was very pleased to stay on Premarin along with increased dose of aspirin.  Primary osteoarthritis of both hands: Mild symptoms  Primary osteoarthritis of both feet: Proper fitting shoes are helpful  Family history of macular degeneration. Pterygium noted on examination of advised her to schedule an  appointment with ophthalmologist  PERSONAL HX COLONIC POLYPS    Orders: Orders Placed This Encounter  Procedures  . CBC with Differential/Platelet  . COMPLETE METABOLIC PANEL WITH GFR   No orders of the defined types were placed in this encounter.   Face-to-face time spent with patient was 30 minutes. 50% of time was spent in counseling and coordination of care.  Follow-Up Instructions: Return in about 5 months (around 03/26/2017) for Autoimmune disease.   Bo Merino, MD  Note - This record has been created using Editor, commissioning.  Chart creation errors have been sought, but may not always  have been located. Such creation errors do not reflect on  the standard of medical care.

## 2016-10-20 DIAGNOSIS — M359 Systemic involvement of connective tissue, unspecified: Secondary | ICD-10-CM | POA: Insufficient documentation

## 2016-10-24 ENCOUNTER — Encounter: Payer: Self-pay | Admitting: Rheumatology

## 2016-10-24 ENCOUNTER — Ambulatory Visit (INDEPENDENT_AMBULATORY_CARE_PROVIDER_SITE_OTHER): Payer: 59 | Admitting: Rheumatology

## 2016-10-24 VITALS — BP 100/58 | HR 62 | Resp 16 | Wt 134.0 lb

## 2016-10-24 DIAGNOSIS — Z79899 Other long term (current) drug therapy: Secondary | ICD-10-CM

## 2016-10-24 DIAGNOSIS — R760 Raised antibody titer: Secondary | ICD-10-CM

## 2016-10-24 DIAGNOSIS — M19072 Primary osteoarthritis, left ankle and foot: Secondary | ICD-10-CM

## 2016-10-24 DIAGNOSIS — I7300 Raynaud's syndrome without gangrene: Secondary | ICD-10-CM | POA: Diagnosis not present

## 2016-10-24 DIAGNOSIS — M359 Systemic involvement of connective tissue, unspecified: Secondary | ICD-10-CM

## 2016-10-24 DIAGNOSIS — M19041 Primary osteoarthritis, right hand: Secondary | ICD-10-CM

## 2016-10-24 DIAGNOSIS — D8989 Other specified disorders involving the immune mechanism, not elsewhere classified: Secondary | ICD-10-CM | POA: Diagnosis not present

## 2016-10-24 DIAGNOSIS — R768 Other specified abnormal immunological findings in serum: Secondary | ICD-10-CM

## 2016-10-24 DIAGNOSIS — M19071 Primary osteoarthritis, right ankle and foot: Secondary | ICD-10-CM

## 2016-10-24 DIAGNOSIS — Z86010 Personal history of colonic polyps: Secondary | ICD-10-CM

## 2016-10-24 DIAGNOSIS — M19042 Primary osteoarthritis, left hand: Secondary | ICD-10-CM

## 2016-10-24 DIAGNOSIS — Z83518 Family history of other specified eye disorder: Secondary | ICD-10-CM

## 2016-10-24 LAB — CBC WITH DIFFERENTIAL/PLATELET
Basophils Absolute: 40 cells/uL (ref 0–200)
Basophils Relative: 1 %
Eosinophils Absolute: 80 cells/uL (ref 15–500)
Eosinophils Relative: 2 %
HCT: 37.8 % (ref 35.0–45.0)
HEMOGLOBIN: 12.8 g/dL (ref 11.7–15.5)
Lymphocytes Relative: 29 %
Lymphs Abs: 1160 cells/uL (ref 850–3900)
MCH: 28.6 pg (ref 27.0–33.0)
MCHC: 33.9 g/dL (ref 32.0–36.0)
MCV: 84.4 fL (ref 80.0–100.0)
MONO ABS: 280 {cells}/uL (ref 200–950)
MPV: 11.7 fL (ref 7.5–12.5)
Monocytes Relative: 7 %
Neutro Abs: 2440 cells/uL (ref 1500–7800)
Neutrophils Relative %: 61 %
Platelets: 239 10*3/uL (ref 140–400)
RBC: 4.48 MIL/uL (ref 3.80–5.10)
RDW: 13.3 % (ref 11.0–15.0)
WBC: 4 10*3/uL (ref 3.8–10.8)

## 2016-10-24 LAB — COMPLETE METABOLIC PANEL WITH GFR
ALT: 19 U/L (ref 6–29)
AST: 20 U/L (ref 10–35)
Albumin: 4.1 g/dL (ref 3.6–5.1)
Alkaline Phosphatase: 28 U/L — ABNORMAL LOW (ref 33–130)
BILIRUBIN TOTAL: 0.5 mg/dL (ref 0.2–1.2)
BUN: 10 mg/dL (ref 7–25)
CO2: 25 mmol/L (ref 20–31)
Calcium: 9.5 mg/dL (ref 8.6–10.4)
Chloride: 107 mmol/L (ref 98–110)
Creat: 0.86 mg/dL (ref 0.50–1.05)
GFR, Est African American: 89 mL/min (ref >=60)
GFR, Est Non African American: 77 mL/min (ref >=60)
Glucose, Bld: 87 mg/dL (ref 65–99)
Potassium: 4.7 mmol/L (ref 3.5–5.3)
Sodium: 140 mmol/L (ref 135–146)
Total Protein: 6.5 g/dL (ref 6.1–8.1)

## 2016-10-25 NOTE — Progress Notes (Signed)
wnl

## 2016-11-22 DIAGNOSIS — Z83518 Family history of other specified eye disorder: Secondary | ICD-10-CM | POA: Diagnosis not present

## 2016-11-22 DIAGNOSIS — M3210 Systemic lupus erythematosus, organ or system involvement unspecified: Secondary | ICD-10-CM | POA: Diagnosis not present

## 2016-11-22 DIAGNOSIS — Z79899 Other long term (current) drug therapy: Secondary | ICD-10-CM | POA: Diagnosis not present

## 2017-02-26 ENCOUNTER — Encounter: Payer: Self-pay | Admitting: Obstetrics & Gynecology

## 2017-02-26 ENCOUNTER — Ambulatory Visit (INDEPENDENT_AMBULATORY_CARE_PROVIDER_SITE_OTHER): Payer: 59 | Admitting: Obstetrics and Gynecology

## 2017-02-26 ENCOUNTER — Encounter: Payer: Self-pay | Admitting: Obstetrics and Gynecology

## 2017-02-26 ENCOUNTER — Telehealth: Payer: Self-pay | Admitting: Obstetrics & Gynecology

## 2017-02-26 ENCOUNTER — Encounter: Payer: Self-pay | Admitting: Rheumatology

## 2017-02-26 VITALS — BP 112/62 | HR 76 | Resp 16 | Wt 135.0 lb

## 2017-02-26 DIAGNOSIS — N9089 Other specified noninflammatory disorders of vulva and perineum: Secondary | ICD-10-CM | POA: Diagnosis not present

## 2017-02-26 DIAGNOSIS — N762 Acute vulvitis: Secondary | ICD-10-CM | POA: Diagnosis not present

## 2017-02-26 DIAGNOSIS — N952 Postmenopausal atrophic vaginitis: Secondary | ICD-10-CM | POA: Diagnosis not present

## 2017-02-26 NOTE — Progress Notes (Signed)
GYNECOLOGY  VISIT   HPI: 56 y.o.   Married  Caucasian  female   G1P1 with Patient's last menstrual period was 10/11/2012.   here for  Vaginal irritation/redness since yesterday morning. She has a tender area at the opening of her vagina. No abnormal d/c, no itching. Burning with touch, soap is irritating.  She has had a TAH (still has her ovaries). She is on oral premarin. Sexually active, no h/o dyspareunia. Had intercourse on Sunday, started feeling sore on Tuesday.  No urinary frequency (other than normal), no urgency no dysuria.  She is going to visit her son in [place] this weekend (he is a Museum/gallery exhibitions officer at [employer]).   GYNECOLOGIC HISTORY: Patient's last menstrual period was 10/11/2012. Contraception:Hysterectomy - ovaries remain Menopausal hormone therapy: Premarin        OB History    Gravida Para Term Preterm AB Living   1 1       1    SAB TAB Ectopic Multiple Live Births                     Patient Active Problem List   Diagnosis Date Noted  . Autoimmune disease ([place]) 10/20/2016  . High risk medication use 09/26/2016  . Positive cardiolipin antibodies 09/19/2016  . Raynaud's disease without gangrene 09/19/2016  . Primary osteoarthritis of both hands 09/19/2016  . Primary osteoarthritis of both feet 09/19/2016  . 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  . Family history of macular degeneration 02/03/2015  . FLATULENCE-GAS-BLOATING 11/29/2008  . HEMORRHOIDS-INTERNAL 11/01/2008  . HEMORRHOIDS-EXTERNAL 11/01/2008  . PERSONAL HX COLONIC POLYPS 11/01/2008  . RECTAL BLEEDING 01/04/2008  . ABDOMINAL PAIN-GENERALIZED 01/04/2008    Past Medical History:  Diagnosis Date  . 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 Fibroid   . Hand pain    left hand-had 2 cortisone injections  . Hx of migraines     Past Surgical History:  Procedure Laterality Date  . ABDOMINAL HYSTERECTOMY    . BREAST SURGERY    . PELVIC LAPAROSCOPY      Current Outpatient  Prescriptions  Medication Sig Dispense Refill  . aspirin 81 MG tablet Take 162 mg by mouth daily.     . hydroxychloroquine (PLAQUENIL) 200 MG tablet TAKE 1 TABLET BY MOUTH EVERY MORNING AND TAKE 1/2 TABLET AT NIGHT 135 tablet 1  . Lutein 20 MG TABS Daily.  Unsure of mg dosage    . PREMARIN 0.625 MG tablet TAKE 1 TABLET (0.625 MG TOTAL) BY MOUTH DAILY. 90 tablet 4   No current facility-administered medications for this visit.      ALLERGIES: Penicillins; Latex; and Adhesive [tape]  Family History  Problem Relation Age of Onset  . Clotting disorder Mother   . Prostate cancer Father   . Clotting disorder Father   . Cervical cancer Maternal Grandmother     Social History   Social History  . Marital status: Married    Spouse name: N/A  . Number of children: N/A  . Years of education: N/A   Occupational History  . Not on file.   Social History Main Topics  . Smoking status: Never Smoker  . Smokeless tobacco: Never Used  . Alcohol use 0.6 - 1.2 oz/week    1 - 2 Standard drinks or equivalent per week  . Drug use: No  . Sexual activity: Yes    Partners: Male    Birth control/ protection: None, Surgical     Comment: TAH  Other Topics Concern  . Not on file   Social History Narrative  . No narrative on file    Review of Systems  Constitutional: Negative.   HENT: Negative.   Eyes: Negative.   Respiratory: Negative.   Cardiovascular: Negative.   Gastrointestinal: Negative.   Genitourinary:       Vaginal irritation/redness  Musculoskeletal: Negative.   Skin: Negative.   Neurological: Negative.   Endo/Heme/Allergies: Negative.   Psychiatric/Behavioral: Negative.     PHYSICAL EXAMINATION:    BP 112/62 (BP Location: Right Arm, Patient Position: Sitting, Cuff Size: Normal)   Pulse 76   Resp 16   Wt 135 lb (61.2 kg)   LMP 10/11/2012   BMI 22.29 kg/m     General appearance: alert, cooperative and appears stated age  Pelvic: External genitalia:  no lesions,  small fissure right under the clitoris, no surrounding erythema, no skin whitening, no pigment changes, no agglutination              Urethra:  normal appearing urethra with no masses, tenderness or lesions              Bartholins and Skenes: normal                 Vagina: normal appearing vagina with normal color and discharge, no lesions. Mild atrophy              Cervix: absent              Chaperone was present for exam.  ASSESSMENT Vulvar fissure, suspect from recent intercourse Mild vaginal atrophy    PLAN Will send Affirm Use Vaseline to the fissure Discussed using water in a peri-bottle if needed when voiding Discussed options of lubrication with intercourse She should control the rate and depth of penetration with intercourse If she has recurrent issues, could try vaginal estrogen   An After Visit Summary was printed and given to the patient.  ~15 minutes face to face time of which over 50% was spent in counseling.   CC: Dr Sabra Heck

## 2017-02-26 NOTE — Telephone Encounter (Signed)
Patient returned call to the office. Requesting to be seen today for vaginitis symptoms today. Aware Dr.Miller is out of the office today and requests to see another provider. Appointment scheduled for today at 2:15 pm with Dr.Jertson. Patient is agreeable to date and time.  Me  to [place]     12:09 PM  Alexandra Garner,   It is our office protocol that you be seen in the office before prescribing medication for yeast infection symptoms to ensure we are properly treating. Please contact the office at your convenience to schedule an appointment to be seen by Dr.Miller. If you are unable to be seen you can use OTC Monistat 3 and hydrocortisone ointment externally for itching/irritation. Local urgent cares are also able to assess and treat yeast infections after work hours if needed. Please let us know how we can assist. We would be happy to help schedule an appointment.   Sincerely,  Reesa Chew, RN    Last read by [name] at 12:14 PM on 02/26/2017.   Routing to provider for final review. Patient agreeable to disposition. Will close encounter.

## 2017-02-26 NOTE — Telephone Encounter (Signed)
Patient calling to check the status of her Mychart message she sent this morning.

## 2017-02-27 ENCOUNTER — Telehealth: Payer: Self-pay | Admitting: *Deleted

## 2017-02-27 LAB — VAGINITIS/VAGINOSIS, DNA PROBE
Candida Species: POSITIVE — AB
GARDNERELLA VAGINALIS: NEGATIVE
TRICHOMONAS VAG: NEGATIVE

## 2017-02-27 MED ORDER — FLUCONAZOLE 150 MG PO TABS: 150.0000 mg | ORAL_TABLET | Freq: Once | ORAL | 0 refills | Status: AC

## 2017-02-27 NOTE — Telephone Encounter (Signed)
Spoke with patient and gave results and sent in RX to [facility]

## 2017-02-27 NOTE — Telephone Encounter (Signed)
-----   Message from Salvadore Dom, MD sent at 02/27/2017  3:30 PM EDT ----- Please inform the patient her vaginitis panel returned with yeast and treat with diflucan 150 mg x 1, may repeat in 72 hours if still symptomatic. #2, no refills

## 2017-03-20 NOTE — Progress Notes (Signed)
Office Visit Note  Patient: Alexandra Garner             Date of Birth: May 13, 1961           MRN: [id number]             PCP: Patient, No Pcp Per Referring: No ref. provider found Visit Date: 03/28/2017 Occupation: @GUAROCC @    Subjective:  Alexandra Garner.   History of Present Illness: Alexandra Garner is a 56 y.o. female with history of autoimmune disease and osteoarthritis. She states she is doing fairly well currently. Her Raynaud's is not very active. She's been taking aspirin on daily basis. She does have some stiffness in her hands and feet but no discomfort.  Activities of Daily Living:  Patient reports morning stiffness for 0 minutes.   Patient Denies nocturnal pain.  Difficulty dressing/grooming: Denies Difficulty climbing stairs: Denies Difficulty getting out of chair: Denies Difficulty using hands for taps, buttons, cutlery, and/or writing: Reports   Review of Systems  Constitutional: Negative.  Negative for fatigue, night sweats, weight gain, weight loss and weakness.  HENT: Negative.  Negative for mouth sores, trouble swallowing, trouble swallowing, mouth dryness and nose dryness.   Eyes: Negative.  Negative for pain, redness, visual disturbance and dryness.  Respiratory: Negative.  Negative for cough, shortness of breath and difficulty breathing.   Cardiovascular: Positive for palpitations. Negative for chest pain, hypertension, irregular heartbeat and swelling in legs/feet.  Gastrointestinal: Negative.  Negative for blood in stool, constipation and diarrhea.  Endocrine: Negative for increased urination.  Genitourinary: Negative for vaginal dryness.  Musculoskeletal: Negative.  Negative for arthralgias, joint pain, joint swelling, myalgias, muscle weakness, morning stiffness, muscle tenderness and myalgias.  Skin: Positive for color change and sensitivity to sunlight. Negative for rash, hair loss, skin tightness and ulcers.  Allergic/Immunologic: Negative for susceptible to  infections.  Neurological: Negative.  Negative for dizziness, numbness, headaches, memory loss and night sweats.  Hematological: Negative for swollen glands.  Psychiatric/Behavioral: Negative.  Negative for depressed mood and sleep disturbance. The patient is not nervous/anxious.     PMFS History:  Patient Active Problem List   Diagnosis Date Noted  . Autoimmune disease ([place]) 10/20/2016  . High risk medication use 09/26/2016  . Positive cardiolipin antibodies 09/19/2016  . Raynaud's disease without gangrene 09/19/2016  . Primary osteoarthritis of both hands 09/19/2016  . Primary osteoarthritis of both feet 09/19/2016  . 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  . Family history of macular degeneration 02/03/2015  . FLATULENCE-GAS-BLOATING 11/29/2008  . HEMORRHOIDS-INTERNAL 11/01/2008  . HEMORRHOIDS-EXTERNAL 11/01/2008  . PERSONAL HX COLONIC POLYPS 11/01/2008  . RECTAL BLEEDING 01/04/2008  . ABDOMINAL PAIN-GENERALIZED 01/04/2008    Past Medical History:  Diagnosis Date  . Autoimmune disease ([place])    in the Lupus family  . Fibroid   . Hand pain    left hand-had 2 cortisone injections  . Hx of migraines     Family History  Problem Relation Age of Onset  . Clotting disorder Mother   . Prostate cancer Father   . Clotting disorder Father   . Cervical cancer Maternal Grandmother    Past Surgical History:  Procedure Laterality Date  . ABDOMINAL HYSTERECTOMY    . BREAST SURGERY    . PELVIC LAPAROSCOPY     Social History   Social History Narrative  . No narrative on file     Objective: Vital Signs: BP (!) 108/58 (BP Location: Left Arm, Patient Position: Sitting, Cuff Size: Normal)   Ht  5\' 6"  (1.676 m)   Wt 137 lb (62.1 kg)   LMP 10/11/2012   BMI 22.11 kg/m    Physical Exam  Constitutional: She is oriented to person, place, and time. She appears well-developed and well-nourished.  HENT:  Head: Normocephalic and atraumatic.  Eyes: Conjunctivae and EOM are  normal.  Neck: Normal range of motion.  Cardiovascular: Normal rate, regular rhythm, normal heart sounds and intact distal pulses.   Pulmonary/Chest: Effort normal and breath sounds normal.  Abdominal: Soft. Bowel sounds are normal.  Lymphadenopathy:    She has no cervical adenopathy.  Neurological: She is alert and oriented to person, place, and time.  Skin: Skin is warm and dry. Capillary refill takes 2 to 3 seconds.  Psychiatric: She has a normal mood and affect. Her behavior is normal.  Nursing note and vitals reviewed.    Musculoskeletal Exam: C-spine and thoracic lumbar spine good range of motion. Shoulder joints elbow joints wrist joints are good range of motion. She has DIP PIP thickening in her hands and feet consistent with osteoarthritis. No synovitis was noted.  CDAI Exam: No CDAI exam completed.    Investigation: Findings:  June 2018 normal Plaquenil eye exam   CBC Latest Ref Rng & Units 10/24/2016 07/17/2016 02/03/2015  WBC 3.8 - 10.8 K/uL 4.0 6.7 -  Hemoglobin 11.7 - 15.5 g/dL 12.8 12.6 12.6  Hematocrit 35.0 - 45.0 % 37.8 36.6 -  Platelets 140 - 400 K/uL 239 261 -   CMP Latest Ref Rng & Units 10/24/2016 07/17/2016 02/03/2015  Glucose 65 - 99 mg/dL 87 141(H) 72  BUN 7 - 25 mg/dL 10 10 10   Creatinine 0.50 - 1.05 mg/dL 0.86 0.74 0.75  Sodium 135 - 146 mmol/L 140 141 140  Potassium 3.5 - 5.3 mmol/L 4.7 3.8 4.0  Chloride 98 - 110 mmol/L 107 101 104  CO2 20 - 31 mmol/L 25 24 28   Calcium 8.6 - 10.4 mg/dL 9.5 9.3 9.1  Total Protein 6.1 - 8.1 g/dL 6.5 6.2 6.4  Total Bilirubin 0.2 - 1.2 mg/dL 0.5 0.4 0.5  Alkaline Phos 33 - 130 U/L 28(L) 30(L) 33  AST 10 - 35 U/L 20 18 19   ALT 6 - 29 U/L 19 18 15      Imaging: No results found.  Speciality Comments: No specialty comments available.    Procedures:  No procedures performed Allergies: Penicillins; Latex; and Adhesive [tape]   Assessment / Plan:     Visit Diagnoses: Autoimmune disease ([place]) -  History of arthralgias,  Alexandra Garner, positive aCL IgM46, positive beta 2 100.  -She is doing quite well on Plaquenil. Her eye exam has been stable. She has mild Raynaud's symptoms. We will obtain following labs to monitor this disease process. Plan: Urinalysis, Routine w reflex microscopic, Anti-DNA antibody, double-stranded, C3 and C4, Sedimentation rate, Sjogrens syndrome-A extractable nuclear antibody, Beta-2 glycoprotein antibodies, Cardiolipin antibodies, IgG, IgM, IgA,  High risk medication use - Plaquenil 200 mg po bid M-F - Plan: CBC with Differential/Platelet, COMPLETE METABOLIC PANEL WITH GFR today and then in 5 months, CBC with Differential/Platelet, COMPLETE METABOLIC PANEL WITH GFR  Raynaud's disease without gangrene: She has some Raynaud's symptoms. Protective clothing and keeping core temperature warm was discussed.  Positive cardiolipin antibodies, positive b2 GP1 - on ECASA 81 mg , 2 tabs po qd  Palpitations: Patient is been experiencing some palpitations. Of advised her to schedule an appointment with PCP for evaluation.  Primary osteoarthritis of both hands: Joint protection and muscle strengthening discussed.  Primary osteoarthritis of both feet: She's been wearing proper fitting shoes.  History of colon polyps  Family history of macular degeneration  Other fatigue - Plan: VITAMIN D 25 Hydroxy (Vit-D Deficiency, Fractures)    Orders: Orders Placed This Encounter  Procedures  . CBC with Differential/Platelet  . COMPLETE METABOLIC PANEL WITH GFR  . Urinalysis, Routine w reflex microscopic  . Anti-DNA antibody, double-stranded  . C3 and C4  . Sedimentation rate  . Sjogrens syndrome-A extractable nuclear antibody  . Beta-2 glycoprotein antibodies  . Cardiolipin antibodies, IgG, IgM, IgA  . VITAMIN D 25 Hydroxy (Vit-D Deficiency, Fractures)  . CBC with Differential/Platelet  . COMPLETE METABOLIC PANEL WITH GFR   Meds ordered this encounter  Medications  . hydroxychloroquine (PLAQUENIL) 200  MG tablet    Sig: 1 tablet by mouth twice a day Monday through Friday    Dispense:  135 tablet    Refill:  1    Face-to-face time spent with patient was 30 minutes. Greater than 50% of time was spent in counseling and coordination of care.  Follow-Up Instructions: Return for Autoimmune disease,, Osteoarthritis.   Bo Merino, MD  Note - This record has been created using Editor, commissioning.  Chart creation errors have been sought, but may not always  have been located. Such creation errors do not reflect on  the standard of medical care.

## 2017-03-28 ENCOUNTER — Encounter: Payer: Self-pay | Admitting: Rheumatology

## 2017-03-28 ENCOUNTER — Ambulatory Visit (INDEPENDENT_AMBULATORY_CARE_PROVIDER_SITE_OTHER): Payer: 59 | Admitting: Rheumatology

## 2017-03-28 VITALS — BP 108/58 | Ht 66.0 in | Wt 137.0 lb

## 2017-03-28 DIAGNOSIS — M19041 Primary osteoarthritis, right hand: Secondary | ICD-10-CM

## 2017-03-28 DIAGNOSIS — Z79899 Other long term (current) drug therapy: Secondary | ICD-10-CM

## 2017-03-28 DIAGNOSIS — M19072 Primary osteoarthritis, left ankle and foot: Secondary | ICD-10-CM

## 2017-03-28 DIAGNOSIS — M19042 Primary osteoarthritis, left hand: Secondary | ICD-10-CM

## 2017-03-28 DIAGNOSIS — R5383 Other fatigue: Secondary | ICD-10-CM | POA: Diagnosis not present

## 2017-03-28 DIAGNOSIS — I7300 Raynaud's syndrome without gangrene: Secondary | ICD-10-CM | POA: Diagnosis not present

## 2017-03-28 DIAGNOSIS — M359 Systemic involvement of connective tissue, unspecified: Secondary | ICD-10-CM

## 2017-03-28 DIAGNOSIS — D8989 Other specified disorders involving the immune mechanism, not elsewhere classified: Secondary | ICD-10-CM | POA: Diagnosis not present

## 2017-03-28 DIAGNOSIS — Z86010 Personal history of colonic polyps: Secondary | ICD-10-CM | POA: Diagnosis not present

## 2017-03-28 DIAGNOSIS — R760 Raised antibody titer: Secondary | ICD-10-CM

## 2017-03-28 DIAGNOSIS — M19071 Primary osteoarthritis, right ankle and foot: Secondary | ICD-10-CM | POA: Diagnosis not present

## 2017-03-28 DIAGNOSIS — Z83518 Family history of other specified eye disorder: Secondary | ICD-10-CM

## 2017-03-28 DIAGNOSIS — R768 Other specified abnormal immunological findings in serum: Secondary | ICD-10-CM

## 2017-03-28 MED ORDER — HYDROXYCHLOROQUINE SULFATE 200 MG PO TABS: ORAL_TABLET | ORAL | 1 refills | Status: DC

## 2017-03-28 NOTE — Patient Instructions (Addendum)
Standing Labs We placed an order today for your standing lab work.    Please come back and get your standing labs in March and every 5 months  We have open lab Monday through Friday from 8:30-11:30 AM and 1:30-4 PM at the office of Dr. Bo Merino.   The office is located at [address] [place], [place], [place] [address] No appointment is necessary.   Labs are drawn by [facility].  You may receive a bill from [facility] for your lab work. If you have any questions regarding directions or hours of operation,  please call [phone].     Please a scheduled appointment with the PCP for evaluation of palpitations.

## 2017-03-29 NOTE — Progress Notes (Signed)
Vit D 50,000 U q wk x 3 mths. Repeat level in 3 mths.

## 2017-03-31 LAB — COMPLETE METABOLIC PANEL WITH GFR
AG RATIO: 1.8 (calc) (ref 1.0–2.5)
ALBUMIN MSPROF: 4.3 g/dL (ref 3.6–5.1)
ALT: 18 U/L (ref 6–29)
AST: 19 U/L (ref 10–35)
Alkaline phosphatase (APISO): 34 U/L (ref 33–130)
BUN: 10 mg/dL (ref 7–25)
CALCIUM: 9.7 mg/dL (ref 8.6–10.4)
CO2: 26 mmol/L (ref 20–32)
Chloride: 104 mmol/L (ref 98–110)
Creat: 0.85 mg/dL (ref 0.50–1.05)
GFR, EST AFRICAN AMERICAN: 90 mL/min/{1.73_m2} (ref >=60)
GFR, EST NON AFRICAN AMERICAN: 78 mL/min/{1.73_m2} (ref >=60)
GLOBULIN: 2.4 g/dL (ref 1.9–3.7)
Glucose, Bld: 67 mg/dL (ref 65–99)
POTASSIUM: 4.6 mmol/L (ref 3.5–5.3)
SODIUM: 138 mmol/L (ref 135–146)
TOTAL PROTEIN: 6.7 g/dL (ref 6.1–8.1)
Total Bilirubin: 0.5 mg/dL (ref 0.2–1.2)

## 2017-03-31 LAB — SJOGRENS SYNDROME-A EXTRACTABLE NUCLEAR ANTIBODY: SSA (Ro) (ENA) Antibody, IgG: <1 AI

## 2017-03-31 LAB — C3 AND C4
C3 Complement: 86 mg/dL (ref 83–193)
C4 Complement: 11 mg/dL — ABNORMAL LOW (ref 15–57)

## 2017-03-31 LAB — URINALYSIS, ROUTINE W REFLEX MICROSCOPIC
BILIRUBIN URINE: NEGATIVE
GLUCOSE, UA: NEGATIVE
HGB URINE DIPSTICK: NEGATIVE
Ketones, ur: NEGATIVE
Leukocytes, UA: NEGATIVE
Nitrite: NEGATIVE
PH: 5.5 (ref 5.0–8.0)
PROTEIN: NEGATIVE
Specific Gravity, Urine: 1.014 (ref 1.001–1.03)

## 2017-03-31 LAB — CBC WITH DIFFERENTIAL/PLATELET
BASOS PCT: 0.6 %
Basophils Absolute: 30 cells/uL (ref 0–200)
Eosinophils Absolute: 80 cells/uL (ref 15–500)
Eosinophils Relative: 1.6 %
HCT: 38.4 % (ref 35.0–45.0)
HEMOGLOBIN: 13 g/dL (ref 11.7–15.5)
LYMPHS ABS: 1265 {cells}/uL (ref 850–3900)
MCH: 28.6 pg (ref 27.0–33.0)
MCHC: 33.9 g/dL (ref 32.0–36.0)
MCV: 84.4 fL (ref 80.0–100.0)
MPV: 11.3 fL (ref 7.5–12.5)
Monocytes Relative: 7.3 %
NEUTROS ABS: 3260 {cells}/uL (ref 1500–7800)
NEUTROS PCT: 65.2 %
Platelets: 257 10*3/uL (ref 140–400)
RBC: 4.55 10*6/uL (ref 3.80–5.10)
RDW: 12.7 % (ref 11.0–15.0)
Total Lymphocyte: 25.3 %
WBC: 5 10*3/uL (ref 3.8–10.8)
WBCMIX: 365 {cells}/uL (ref 200–950)

## 2017-03-31 LAB — BETA-2 GLYCOPROTEIN ANTIBODIES: BETA 2 GLYCO 1 IGM: 33 SMU — AB (ref <=20)

## 2017-03-31 LAB — CARDIOLIPIN ANTIBODIES, IGG, IGM, IGA
Anticardiolipin IgA: <11 [APL'U]
Anticardiolipin IgM: <12 [MPL'U]

## 2017-03-31 LAB — VITAMIN D 25 HYDROXY (VIT D DEFICIENCY, FRACTURES): VIT D 25 HYDROXY: 27 ng/mL — AB (ref 30–100)

## 2017-03-31 LAB — ANTI-DNA ANTIBODY, DOUBLE-STRANDED

## 2017-03-31 LAB — SEDIMENTATION RATE: Sed Rate: 6 mm/h (ref 0–30)

## 2017-03-31 NOTE — Progress Notes (Signed)
Labs are stable. Antibody titers are better. May reduce PLQ to 1 tab po qd.Continue ECASA.

## 2017-04-02 ENCOUNTER — Telehealth: Payer: Self-pay | Admitting: Radiology

## 2017-04-02 DIAGNOSIS — E559 Vitamin D deficiency, unspecified: Secondary | ICD-10-CM

## 2017-04-02 MED ORDER — VITAMIN D3 1.25 MG (50000 UT) PO CAPS: 50000.0000 [IU] | ORAL_CAPSULE | ORAL | 0 refills | Status: AC

## 2017-04-02 NOTE — Telephone Encounter (Signed)
Left message for patient to advise I have sent her a my chart message about Vitamin D and about instructions to reduce her PLQ dose to once daily

## 2017-04-02 NOTE — Telephone Encounter (Signed)
-----   Message from Bo Merino, MD sent at 03/29/2017  3:22 PM EDT ----- Vit D 50,000 U q wk x 3 mths. Repeat level in 3 mths.

## 2017-06-13 ENCOUNTER — Encounter: Payer: Self-pay | Admitting: Obstetrics & Gynecology

## 2017-06-18 ENCOUNTER — Telehealth: Payer: Self-pay | Admitting: Obstetrics & Gynecology

## 2017-06-18 NOTE — Telephone Encounter (Signed)
Routing to [place] for review. Okay to advise patient will need to see PCP?

## 2017-06-18 NOTE — Telephone Encounter (Signed)
Patient sent the following correspondence through [place] and I sent her a message back as well:  Hi Dreonna,  I hope this message finds you well. So you are aware, Sabra Heck is on vacation at this time. I'm forwarding this message on to our triage nurses to assist you with. They will be reaching out to you by telephone today.  Thank you,  Starla ===View-only below this line===   ----- Message -----    From: [name]    Sent: 06/15/2017  8:50 AM EST      To: [name], MD Subject: RE: Non-Urgent Medical Question  Thank you for the response.  Unfortunately my annual appointment is not until January 24. As you probably notice from my records, I have not requested a diuretic in quite some time as I take them only when required. I was hopeful you would at least prescribe 5-10 until I am able to see you in January. If that is unacceptable, then I am happy to come into the office prior to the January appointment for a visit. Thanks for the consideration of my request, Bailyn     ----- Message ----- From: [name], MD Sent: 06/14/2017 10:12 PM EST To: Katherine Mantle Dancy Subject: RE: Non-Urgent Medical Question [place] Im not really keen on writing as needed prescriptions for diuretics.  I hope it is ok that I dont feel comfortable with this at this time.  Im sorry.    Edwinna Areola  ----- Message -----    From: [name]    Sent: 06/13/2017 12:33 PM EST      To: [name], MD Subject: Non-Urgent Medical Question  Hello, I am requesting a refill on a prescription I have not needed in quite some time. Dr. Sabra Heck had prescribed a diuretic in the past and I use sporadically and have finally run-out. Can you please send a prescription to the [facility] in [place]? Thank you so much, Mikeyla

## 2017-06-19 ENCOUNTER — Encounter: Payer: Self-pay | Admitting: Obstetrics & Gynecology

## 2017-06-19 NOTE — Telephone Encounter (Signed)
Patient called to check on the status of message left yesterday.

## 2017-06-19 NOTE — Telephone Encounter (Signed)
Send pt mychart message today to indicate I do not think it is a good idea to have prn medication for a diuretic and that an OV is only appropriate if she is having chronic edema and this should be with a PCP.  Ok to close encounter.
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
## 2017-06-25 ENCOUNTER — Other Ambulatory Visit: Payer: Self-pay | Admitting: Rheumatology

## 2017-06-26 NOTE — Telephone Encounter (Signed)
Left message to advise patient will need labs to before we can refill

## 2017-07-17 ENCOUNTER — Ambulatory Visit: Payer: 59 | Admitting: Obstetrics & Gynecology

## 2017-07-29 ENCOUNTER — Other Ambulatory Visit: Payer: Self-pay | Admitting: Obstetrics & Gynecology

## 2017-07-29 DIAGNOSIS — Z1231 Encounter for screening mammogram for malignant neoplasm of breast: Secondary | ICD-10-CM

## 2017-08-15 NOTE — Progress Notes (Deleted)
Office Visit Note  Patient: Alexandra Garner             Date of Birth: 22-Nov-1960           MRN: [id number]             PCP: Patient, No Pcp Per Referring: No ref. provider found Visit Date: 08/29/2017 Occupation: @GUAROCC @    Subjective:  No chief complaint on file.   History of Present Illness: Alexandra Garner is a 57 y.o. female ***   Activities of Daily Living:  Patient reports morning stiffness for *** {minute/hour:19697}.   Patient {ACTIONS;DENIES/REPORTS:21021675::"Denies"} nocturnal pain.  Difficulty dressing/grooming: {ACTIONS;DENIES/REPORTS:21021675::"Denies"} Difficulty climbing stairs: {ACTIONS;DENIES/REPORTS:21021675::"Denies"} Difficulty getting out of chair: {ACTIONS;DENIES/REPORTS:21021675::"Denies"} Difficulty using hands for taps, buttons, cutlery, and/or writing: {ACTIONS;DENIES/REPORTS:21021675::"Denies"}   No Rheumatology ROS completed.   PMFS History:  Patient Active Problem List   Diagnosis Date Noted  . Autoimmune disease ([place]) 10/20/2016  . High risk medication use 09/26/2016  . Positive cardiolipin antibodies 09/19/2016  . Raynaud's disease without gangrene 09/19/2016  . Primary osteoarthritis of both hands 09/19/2016  . Primary osteoarthritis of both feet 09/19/2016  . 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  . Family history of macular degeneration 02/03/2015  . FLATULENCE-GAS-BLOATING 11/29/2008  . HEMORRHOIDS-INTERNAL 11/01/2008  . HEMORRHOIDS-EXTERNAL 11/01/2008  . PERSONAL HX COLONIC POLYPS 11/01/2008  . RECTAL BLEEDING 01/04/2008  . ABDOMINAL PAIN-GENERALIZED 01/04/2008    Past Medical History:  Diagnosis Date  . Autoimmune disease ([place])    in the Lupus family  . Fibroid   . Hand pain    left hand-had 2 cortisone injections  . Hx of migraines     Family History  Problem Relation Age of Onset  . Clotting disorder Mother   . Prostate cancer Father   . Clotting disorder Father   . Cervical cancer Maternal Grandmother     Past Surgical History:  Procedure Laterality Date  . ABDOMINAL HYSTERECTOMY    . BREAST SURGERY    . PELVIC LAPAROSCOPY     Social History   Social History Narrative  . Not on file     Objective: Vital Signs: LMP 10/11/2012    Physical Exam   Musculoskeletal Exam: ***  CDAI Exam: No CDAI exam completed.    Investigation: No additional findings.PLQ eye exam: 12/09/2016 CBC Latest Ref Rng & Units 03/28/2017 10/24/2016 07/17/2016  WBC 3.8 - 10.8 Thousand/uL 5.0 4.0 6.7  Hemoglobin 11.7 - 15.5 g/dL 13.0 12.8 12.6  Hematocrit 35.0 - 45.0 % 38.4 37.8 36.6  Platelets 140 - 400 Thousand/uL 257 239 261   CMP Latest Ref Rng & Units 03/28/2017 10/24/2016 07/17/2016  Glucose 65 - 99 mg/dL 67 87 141(H)  BUN 7 - 25 mg/dL 10 10 10   Creatinine 0.50 - 1.05 mg/dL 0.85 0.86 0.74  Sodium 135 - 146 mmol/L 138 140 141  Potassium 3.5 - 5.3 mmol/L 4.6 4.7 3.8  Chloride 98 - 110 mmol/L 104 107 101  CO2 20 - 32 mmol/L 26 25 24   Calcium 8.6 - 10.4 mg/dL 9.7 9.5 9.3  Total Protein 6.1 - 8.1 g/dL 6.7 6.5 6.2  Total Bilirubin 0.2 - 1.2 mg/dL 0.5 0.5 0.4  Alkaline Phos 33 - 130 U/L - 28(L) 30(L)  AST 10 - 35 U/L 19 20 18   ALT 6 - 29 U/L 18 19 18     Imaging: No results found.  Speciality Comments: No specialty comments available.    Procedures:  No procedures performed Allergies: Penicillins; Latex; and Adhesive [tape]  Assessment / Plan:     Visit Diagnoses: No diagnosis found.    Orders: No orders of the defined types were placed in this encounter.  No orders of the defined types were placed in this encounter.   Face-to-face time spent with patient was *** minutes. 50% of time was spent in counseling and coordination of care.  Follow-Up Instructions: No Follow-up on file.   Earnestine Mealing, CMA  Note - This record has been created using Editor, commissioning.  Chart creation errors have been sought, but may not always  have been located. Such creation errors do not reflect on   the standard of medical care.

## 2017-08-19 ENCOUNTER — Ambulatory Visit
Admission: RE | Admit: 2017-08-19 | Discharge: 2017-08-19 | Disposition: A | Discharge status: Home / Self Care | Payer: 59 | Source: Ambulatory Visit | Attending: Obstetrics & Gynecology | Admitting: Obstetrics & Gynecology

## 2017-08-19 DIAGNOSIS — Z1231 Encounter for screening mammogram for malignant neoplasm of breast: Secondary | ICD-10-CM

## 2017-08-19 IMAGING — MG DIGITAL SCREENING BILATERAL MAMMOGRAM WITH IMPLANTS, CAD AND TOM
9 of 12 series · 9 of 28 positions shown · non-contrast
Comparison: Previous exam(s).

CLINICAL DATA: Screening.

EXAM:
DIGITAL SCREENING BILATERAL MAMMOGRAM WITH IMPLANTS, CAD AND TOMO
The patient has retropectoral implants. Standard and implant
displaced views were performed.

[R CC]
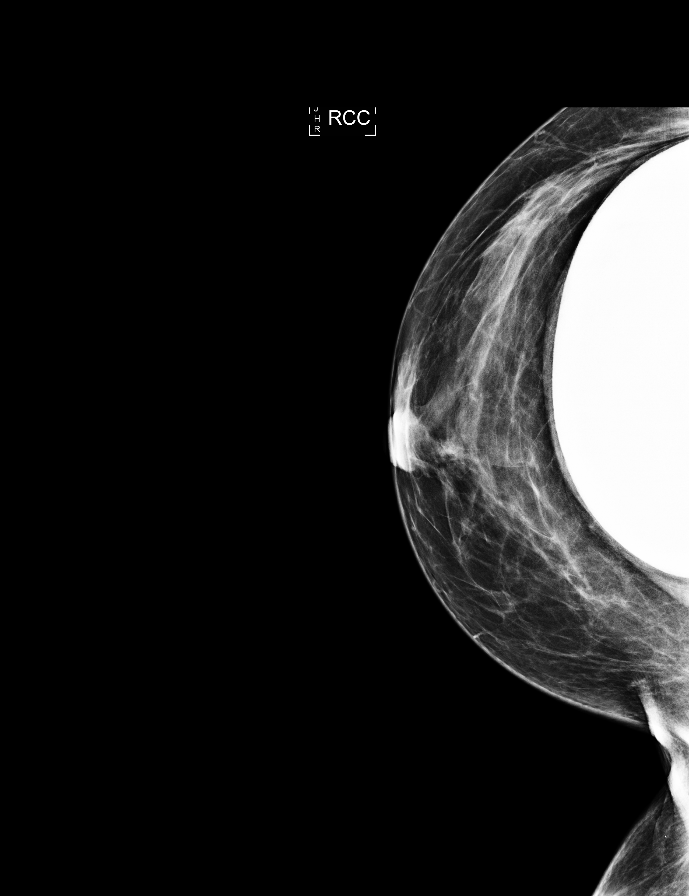

[L CC]
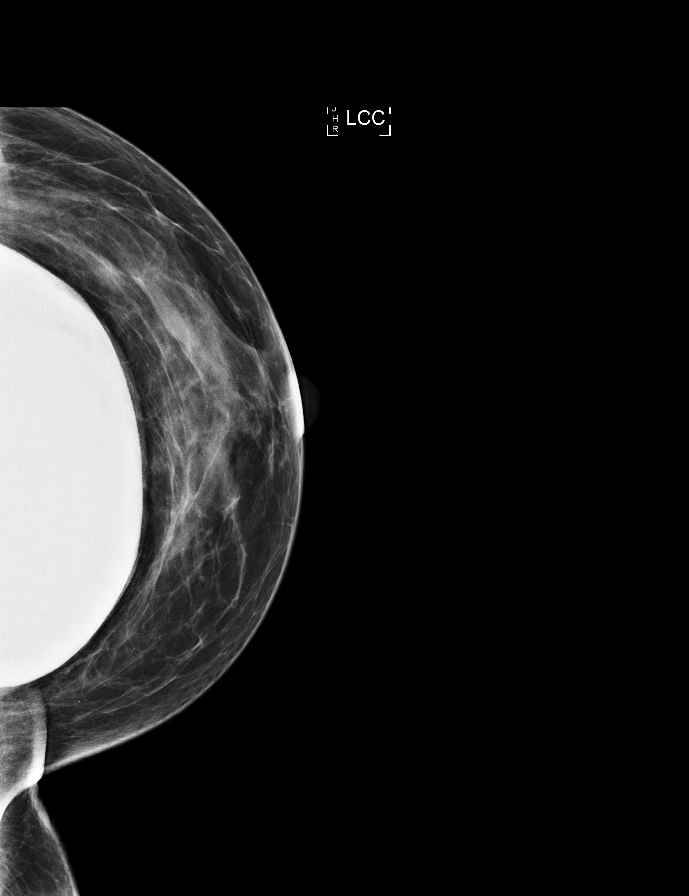

[L MLO]
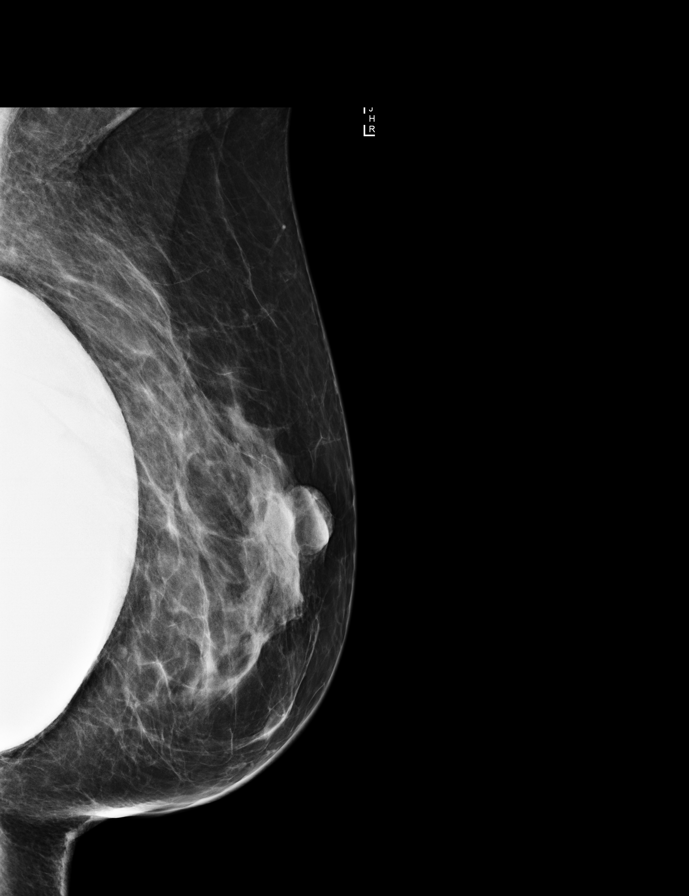

[R MLO]
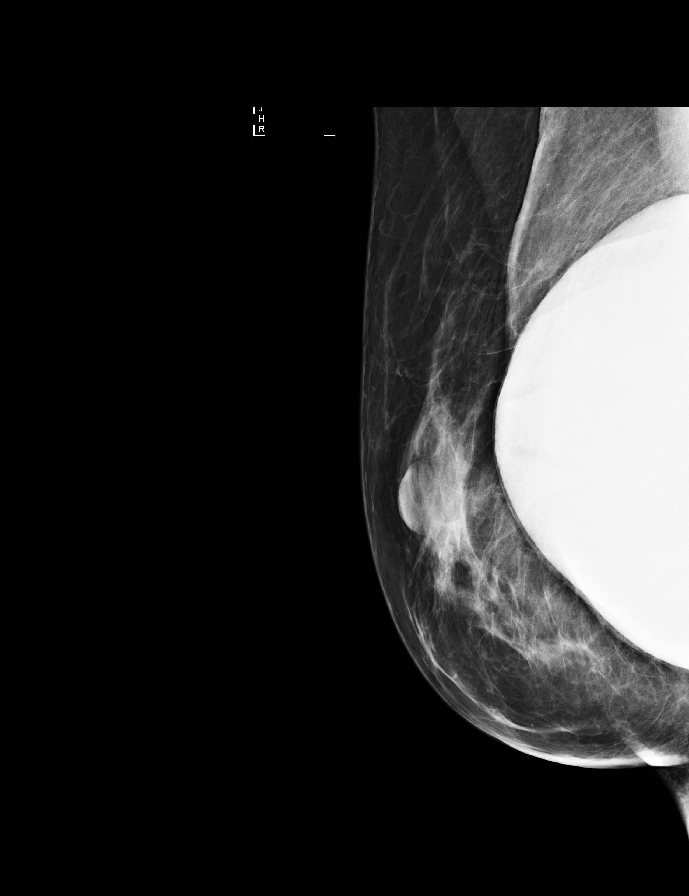

[R CC synth-2D]
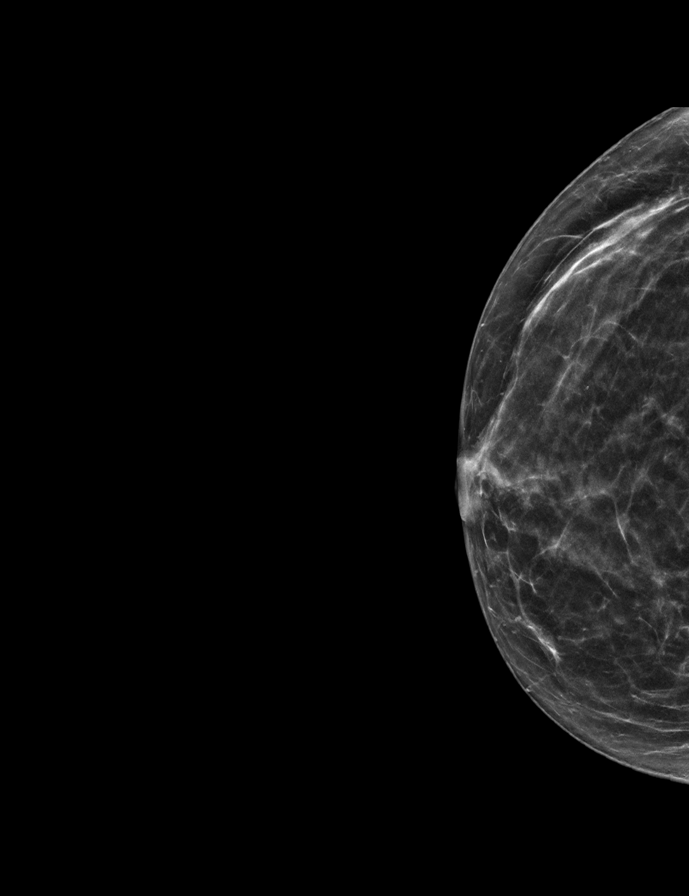

[L CC synth-2D]
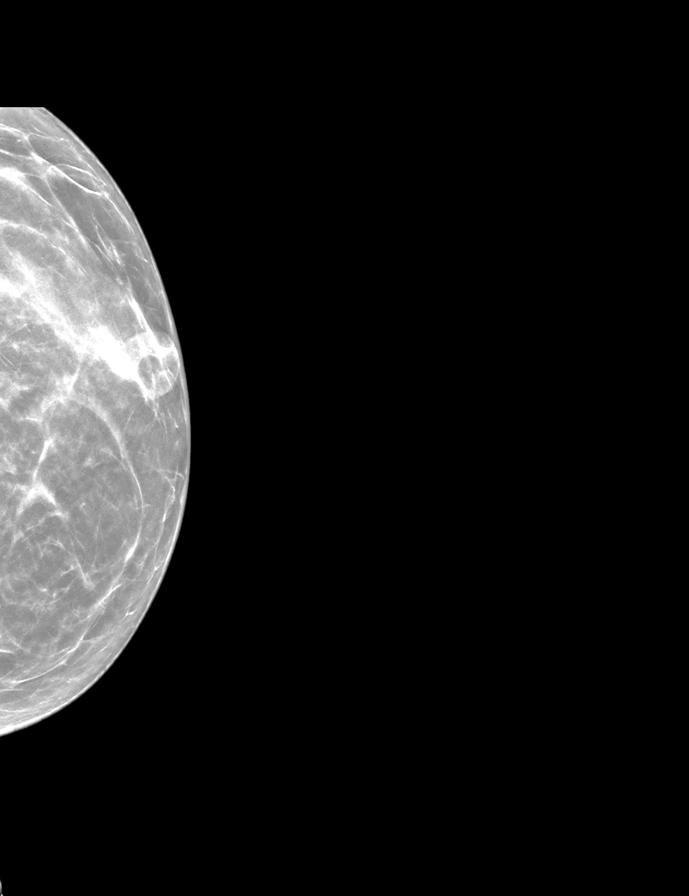

[L MLO synth-2D]
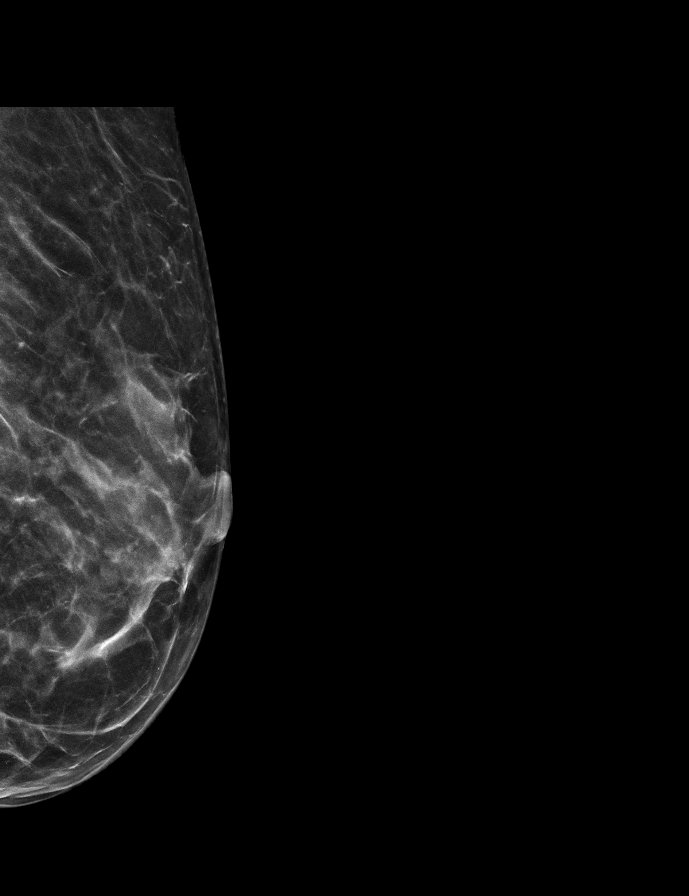

[R MLO synth-2D]
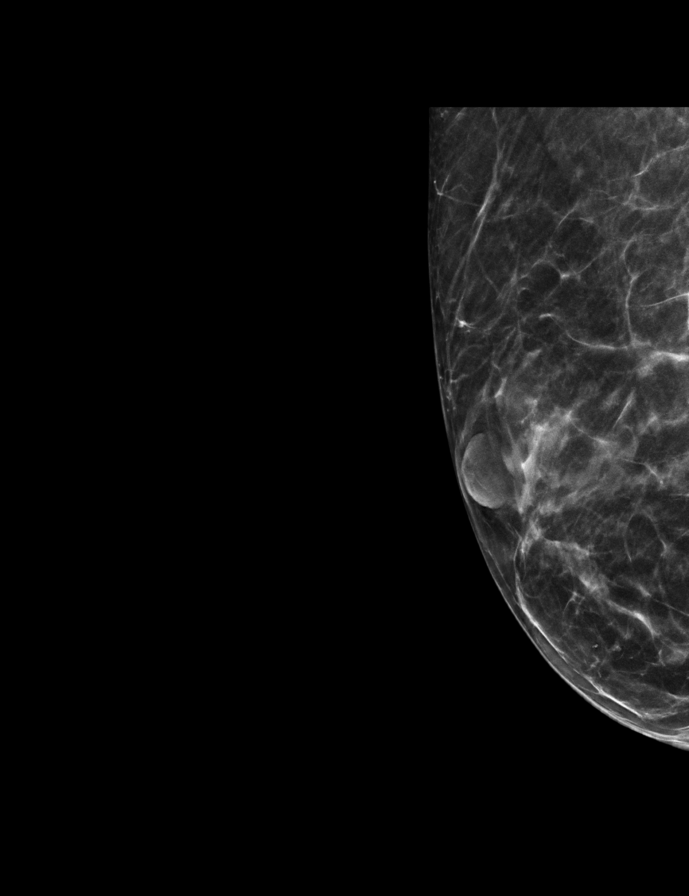

[L CCID BREAST TOMOSYNTHESIS IMAGE tomo · tomo slice 23/45.0]
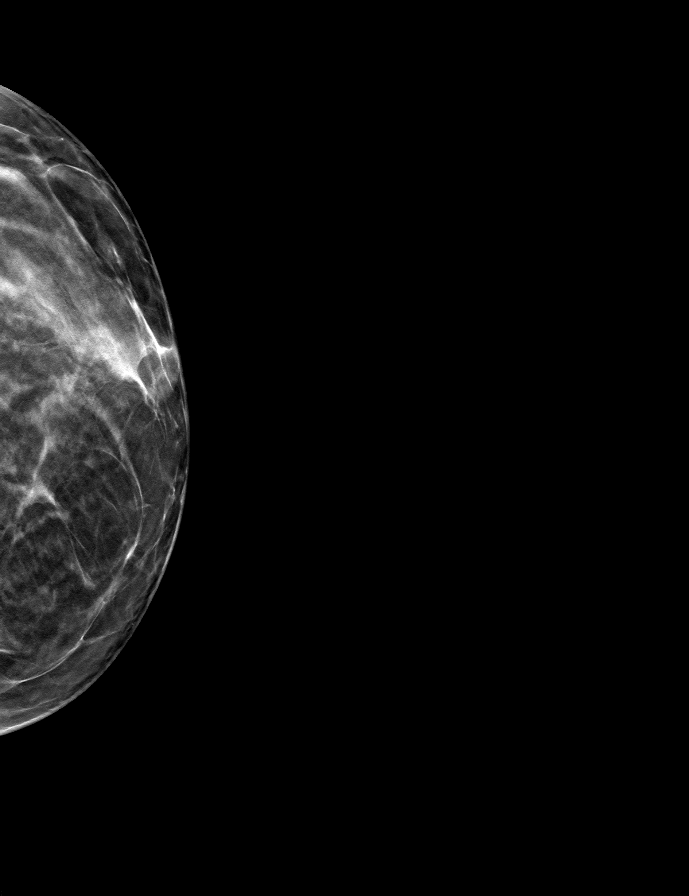

[9 of 28 positions shown; findings below may reference images not displayed]

ACR Breast Density Category c: The breast tissue is heterogeneously
dense, which may obscure small masses.
FINDINGS: There are no findings suspicious for malignancy. Images were
processed with CAD.
IMPRESSION: No mammographic evidence of malignancy. A result letter of this
screening mammogram will be mailed directly to the patient.

RECOMMENDATION:
Screening mammogram in one year. (Code:[5L])

BI-RADS CATEGORY  1:  Negative.

## 2017-08-21 ENCOUNTER — Encounter: Payer: Self-pay | Admitting: Obstetrics & Gynecology

## 2017-08-21 ENCOUNTER — Other Ambulatory Visit: Payer: Self-pay

## 2017-08-21 ENCOUNTER — Ambulatory Visit: Payer: 59 | Admitting: Obstetrics & Gynecology

## 2017-08-21 VITALS — BP 108/68 | HR 60 | Resp 14 | Ht 65.5 in | Wt 139.0 lb

## 2017-08-21 DIAGNOSIS — Z1211 Encounter for screening for malignant neoplasm of colon: Secondary | ICD-10-CM

## 2017-08-21 DIAGNOSIS — Z0000 Encounter for general adult medical examination without abnormal findings: Secondary | ICD-10-CM

## 2017-08-21 DIAGNOSIS — Z01419 Encounter for gynecological examination (general) (routine) without abnormal findings: Secondary | ICD-10-CM

## 2017-08-21 LAB — POCT URINALYSIS DIPSTICK
BILIRUBIN UA: NEGATIVE
Blood, UA: NEGATIVE
Glucose, UA: NEGATIVE
Ketones, UA: NEGATIVE
Leukocytes, UA: NEGATIVE
NITRITE UA: NEGATIVE
PH UA: 5 (ref 5.0–8.0)
PROTEIN UA: NEGATIVE
Urobilinogen, UA: 0.2 E.U./dL

## 2017-08-21 MED ORDER — ESTROGENS CONJUGATED 0.625 MG PO TABS: 0.6250 mg | ORAL_TABLET | Freq: Every day | ORAL | 4 refills | Status: DC

## 2017-08-21 NOTE — Progress Notes (Signed)
57 y.o. G1P1 MarriedCaucasianF here for annual exam. H/O antiphospholipid antibody positive testing.  She did see Dr. Marin Olp last year in regarding to risks for thrombotic events.  He felt pt would likely be ok continuing on HRT.  We discussed decreasing dosage which I feel would be a really good idea.    Pt still dealing with daughter's transition to being a female.  He is in school in [place].  Decision for college has been a good one for him.  Pt tearful about this.  Does not want to change HRT dosage at this time.  She desires diuretic for when she has "swelling".  Have already advised pt via mychart that I do not feel intermittent use like this is a good idea and that I will not write this medication.  Many years ago, I gave her an rx for this but have matured in my recommendations regarding this.  Pt ok with this.  Patient's last menstrual period was 10/11/2012.          Sexually active: Yes.    The current method of family planning is status post hysterectomy.    Exercising: Yes.    Gym/ health club routine includes cardio. Smoker:  no  Health Maintenance: Pap: 07/2010 Neg  History of abnormal Pap:  no MMG:   08/19/17 BIRADS1:neg Colonoscopy:  03/28/2008 f/u 10 years  BMD:   none TDaP:  2013  Pneumonia vaccine(s):  2016 Shingrix:   No  Hep C testing: 03/29/16 Neg  Screening Labs: discuss with provider, Hb today: discuss with provider, Urine today: normal    reports that  has never smoked. she has never used smokeless tobacco. She reports that she drinks about 0.6 - 1.2 oz of alcohol per week. She reports that she does not use drugs.  Past Medical History:  Diagnosis Date  . 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 Fibroid   . Hand pain    left hand-had 2 cortisone injections  . Hx of migraines     Past Surgical History:  Procedure Laterality Date  . ABDOMINAL HYSTERECTOMY    . BREAST SURGERY    . PELVIC LAPAROSCOPY      Current Outpatient Medications  Medication  Sig Dispense Refill  . aspirin 81 MG tablet Take 162 mg by mouth daily.     . hydroxychloroquine (PLAQUENIL) 200 MG tablet 1 tablet by mouth twice a day Monday through Friday (Patient taking differently: daily. 1 tablet by mouth twice a day Monday through Friday) 135 tablet 1  . Lutein 20 MG TABS Daily.  Unsure of mg dosage    . PREMARIN 0.625 MG tablet TAKE 1 TABLET (0.625 MG TOTAL) BY MOUTH DAILY. 90 tablet 4   No current facility-administered medications for this visit.     Family History  Problem Relation Age of Onset  . Clotting disorder Mother   . Prostate cancer Father   . Clotting disorder Father   . Cervical cancer Maternal Grandmother     ROS:  Pertinent items are noted in HPI.  Otherwise, a comprehensive ROS was negative.  Exam:   BP 108/68 (BP Location: Right Arm, Patient Position: Sitting, Cuff Size: Normal)   Pulse 60   Resp 14   Ht 5' 5.5" (1.664 m)   Wt 139 lb (63 kg)   LMP 10/11/2012   BMI 22.78 kg/m   Height: 5' 5.5" (166.4 cm)  Ht Readings from Last 3 Encounters:  08/21/17 5' 5.5" (1.664  m)  03/28/17 5\' 6"  (1.676 m)  08/12/16 5' 5.25" (1.657 m)    General appearance: alert, cooperative and appears stated age Head: Normocephalic, without obvious abnormality, atraumatic Neck: no adenopathy, supple, symmetrical, trachea midline and thyroid normal to inspection and palpation Lungs: clear to auscultation bilaterally Breasts: normal appearance, no masses or tenderness, implants present Heart: regular rate and rhythm Abdomen: soft, non-tender; bowel sounds normal; no masses,  no organomegaly Extremities: extremities normal, atraumatic, no cyanosis or edema Skin: Skin color, texture, turgor normal. No rashes or lesions Lymph nodes: Cervical, supraclavicular, and axillary nodes normal. No abnormal inguinal nodes palpated Neurologic: Grossly normal   Pelvic: External genitalia:  no lesions              Urethra:  normal appearing urethra with no masses,  tenderness or lesions              Bartholins and Skenes: normal                 Vagina: normal appearing vagina with normal color and discharge, no lesions              Cervix: absent              Pap taken: No. Bimanual Exam:  Uterus:  uterus absent              Adnexa: no mass, fullness, tenderness               Rectovaginal: Confirms               Anus:  normal sphincter tone, no lesions  Chaperone was present for exam.  A:  Well Woman with normal exam PMP, on HRT (clearly aware she has DVT/PE risks) H/O + cardiolipin antibody, non-specific autoimmune d/o followed by Dr. Estanislado Pandy, on Plaqunil H/O TAH 2005, ovaries remain Family hx of DVS with father H/O bilateral breast reduction  P:   Mammogram guidelines reviewed.  Doing 3D MMG. No pap smear obtained today.  Not indicated. Referral to GI for screening colonoscopy.  Due later this year. RF for Premarin 0.625mg  daily.  #90/4RF.  Blood work UTD.  Shingrix vaccine discussed.  Other vaccines are UTD. Return annually or prn

## 2017-08-27 NOTE — Progress Notes (Deleted)
Office Visit Note  Patient: Alexandra Garner             Date of Birth: [birth year]           MRN: [id number]             PCP: Patient, No Pcp Per Referring: No ref. provider found Visit Date: 09/02/2017 Occupation: @GUAROCC @    Subjective:  No chief complaint on file.   History of Present Illness: Alexandra Garner is a 57 y.o. female ***   Activities of Daily Living:  Patient reports morning stiffness for *** {minute/hour:19697}.   Patient {ACTIONS;DENIES/REPORTS:21021675::"Denies"} nocturnal pain.  Difficulty dressing/grooming: {ACTIONS;DENIES/REPORTS:21021675::"Denies"} Difficulty climbing stairs: {ACTIONS;DENIES/REPORTS:21021675::"Denies"} Difficulty getting out of chair: {ACTIONS;DENIES/REPORTS:21021675::"Denies"} Difficulty using hands for taps, buttons, cutlery, and/or writing: {ACTIONS;DENIES/REPORTS:21021675::"Denies"}   No Rheumatology ROS completed.   PMFS History:  Patient Active Problem List   Diagnosis Date Noted  . Autoimmune disease ([place]) 10/20/2016  . High risk medication use 09/26/2016  . Positive cardiolipin antibodies 09/19/2016  . Raynaud's disease without gangrene 09/19/2016  . Primary osteoarthritis of both hands 09/19/2016  . Primary osteoarthritis of both feet 09/19/2016  . 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  . Family history of macular degeneration 02/03/2015  . FLATULENCE-GAS-BLOATING 11/29/2008  . HEMORRHOIDS-INTERNAL 11/01/2008  . HEMORRHOIDS-EXTERNAL 11/01/2008  . PERSONAL HX COLONIC POLYPS 11/01/2008  . RECTAL BLEEDING 01/04/2008  . ABDOMINAL PAIN-GENERALIZED 01/04/2008    Past Medical History:  Diagnosis Date  . 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 Fibroid   . Hand pain    left hand-had 2 cortisone injections  . Hx of migraines     Family History  Problem Relation Age of Onset  . Clotting disorder Mother   . Prostate cancer Father   . Clotting disorder Father   . Cervical cancer Maternal Grandmother     Past Surgical History:  Procedure Laterality Date  . ABDOMINAL HYSTERECTOMY    . BREAST SURGERY    . PELVIC LAPAROSCOPY     Social History   Social History Narrative  . Not on file     Objective: Vital Signs: LMP 10/11/2012    Physical Exam   Musculoskeletal Exam: ***  CDAI Exam: No CDAI exam completed.    Investigation: No additional findings. CBC Latest Ref Rng & Units 03/28/2017 10/24/2016 07/17/2016  WBC 3.8 - 10.8 Thousand/uL 5.0 4.0 6.7  Hemoglobin 11.7 - 15.5 g/dL 13.0 12.8 12.6  Hematocrit 35.0 - 45.0 % 38.4 37.8 36.6  Platelets 140 - 400 Thousand/uL 257 239 261   CMP Latest Ref Rng & Units 03/28/2017 10/24/2016 07/17/2016  Glucose 65 - 99 mg/dL 67 87 141(H)  BUN 7 - 25 mg/dL 10 10 10   Creatinine 0.50 - 1.05 mg/dL 0.85 0.86 0.74  Sodium 135 - 146 mmol/L 138 140 141  Potassium 3.5 - 5.3 mmol/L 4.6 4.7 3.8  Chloride 98 - 110 mmol/L 104 107 101  CO2 20 - 32 mmol/L 26 25 24   Calcium 8.6 - 10.4 mg/dL 9.7 9.5 9.3  Total Protein 6.1 - 8.1 g/dL 6.7 6.5 6.2  Total Bilirubin 0.2 - 1.2 mg/dL 0.5 0.5 0.4  Alkaline Phos 33 - 130 U/L - 28(L) 30(L)  AST 10 - 35 U/L 19 20 18   ALT 6 - 29 U/L 18 19 18     Imaging: Mm Screening Breast W/implant Tomo Bilateral  Result Date: 08/19/2017 CLINICAL DATA:  Screening. EXAM: DIGITAL SCREENING BILATERAL MAMMOGRAM WITH IMPLANTS, CAD AND TOMO The patient has retropectoral  implants. Standard and implant displaced views were performed. COMPARISON:  Previous exam(s). ACR Breast Density Category c: The breast tissue is heterogeneously dense, which may obscure small masses. FINDINGS: There are no findings suspicious for malignancy. Images were processed with CAD. IMPRESSION: No mammographic evidence of malignancy. A result letter of this screening mammogram will be mailed directly to the patient. RECOMMENDATION: Screening mammogram in one year. (Code:SM-B-01Y) BI-RADS CATEGORY  1:  Negative. Electronically Signed   By: Lovey Newcomer M.D.   On:  08/19/2017 12:00    Speciality Comments: No specialty comments available.    Procedures:  No procedures performed Allergies: Penicillins; Latex; and Adhesive [tape]   Assessment / Plan:     Visit Diagnoses: Autoimmune disease ([place]) - History of arthralgias, [name], positive aCL IgM46, positive beta 2 100. Autoimmune labs: 03/28/17  High risk medication use - PLQ 200 mg BID M-F Eye exam: 12/09/16  Raynaud's disease without gangrene  Antiphospholipid antibody positive  Positive cardiolipin antibodies  Primary osteoarthritis of both hands  Primary osteoarthritis of both feet  Palpitations  History of colonic polyps  Other fatigue    Orders: No orders of the defined types were placed in this encounter.  No orders of the defined types were placed in this encounter.   Face-to-face time spent with patient was *** minutes. 50% of time was spent in counseling and coordination of care.  Follow-Up Instructions: No Follow-up on file.   Ofilia Neas, PA-C  Note - This record has been created using Dragon software.  Chart creation errors have been sought, but may not always  have been located. Such creation errors do not reflect on  the standard of medical care.

## 2017-08-29 ENCOUNTER — Ambulatory Visit: Payer: 59 | Admitting: Rheumatology

## 2017-08-29 NOTE — Progress Notes (Signed)
Office Visit Note  Patient: Alexandra Garner             Date of Birth: 02/04/1961           MRN: [id number]             PCP: Patient, No Pcp Per Referring: No ref. provider found Visit Date: 09/05/2017 Occupation: @GUAROCC @    Subjective:  Rash    History of Present Illness: Alexandra Garner is a 57 y.o. female with history of autoimmune disease, osteoarthritis, and raynaud's.  Patient states she has been taking Plaquenil 1 tablet daily.  She states she was recently in [place] and developed a rash on her bilateral hands and lower legs.  Patient states she tried to stay out of sun as much as she could.  She was using topical hydrocortisone cream.  She states the rash was very itchy and her bilateral hands swelled.  She states she occasionally has pain in her bilateral hands and joint stiffness.  She states her feet also cause discomfort at times.  Recent flares of her autoimmune disease.  She denies any mouth sores or no sores.  She denies any swollen lymph nodes.  She denies any facial rashes.  Denies any fevers or fatigue.  She continues to have Raynaud's in her hands and feet.  She states that she developed a digital ulceration on her left thumb that took about 4-6 weeks to heal.  She used neosporin and a liquid band-aid and it healed about 3 weeks ago.  She continues to take Aspirin 81 mg two tablets daily. Patient states her left knee has been causing some discomfort when climbing stairs.  She states she feels a sensation of "squishing."  She denies any mechanical symptoms.  She states her bilateral ankles swelling.     Activities of Daily Living: Patient reports morning stiffness for 2 minutes.   Patient Denies nocturnal pain.  Difficulty dressing/grooming: Denies Difficulty climbing stairs: Reports Difficulty getting out of chair: Denies Difficulty using hands for taps, buttons, cutlery, and/or writing: Reports   Review of Systems  Constitutional: Negative for fatigue and weakness.    HENT: Negative for mouth sores, mouth dryness and nose dryness.   Eyes: Negative for pain, redness, visual disturbance and dryness.  Respiratory: Negative for cough, hemoptysis, shortness of breath and difficulty breathing.   Cardiovascular: Negative for chest pain, palpitations, hypertension, irregular heartbeat and swelling in legs/feet.  Gastrointestinal: Negative for blood in stool, constipation and diarrhea.  Endocrine: Negative for increased urination.  Genitourinary: Negative for painful urination.  Musculoskeletal: Positive for arthralgias, joint pain, joint swelling and morning stiffness. Negative for myalgias, muscle weakness, muscle tenderness and myalgias.  Skin: Positive for color change, rash and sensitivity to sunlight. Negative for pallor, hair loss, nodules/bumps, redness, skin tightness and ulcers.  Allergic/Immunologic: Negative for susceptible to infections.  Neurological: Negative for dizziness, numbness and headaches.  Hematological: Negative for swollen glands.  Psychiatric/Behavioral: Negative for depressed mood and sleep disturbance. The patient is not nervous/anxious.     PMFS History:  Patient Active Problem List   Diagnosis Date Noted  . Autoimmune disease ([place]) 10/20/2016  . High risk medication use 09/26/2016  . Positive cardiolipin antibodies 09/19/2016  . Raynaud's disease without gangrene 09/19/2016  . Primary osteoarthritis of both hands 09/19/2016  . Primary osteoarthritis of both feet 09/19/2016  . 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  . Family history of macular degeneration 02/03/2015  . FLATULENCE-GAS-BLOATING 11/29/2008  . HEMORRHOIDS-INTERNAL 11/01/2008  . HEMORRHOIDS-EXTERNAL  11/01/2008  . PERSONAL HX COLONIC POLYPS 11/01/2008  . RECTAL BLEEDING 01/04/2008  . ABDOMINAL PAIN-GENERALIZED 01/04/2008    Past Medical History:  Diagnosis Date  . 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 Fibroid   . Hand pain    left  hand-had 2 cortisone injections  . Hx of migraines     Family History  Problem Relation Age of Onset  . Clotting disorder Mother   . Macular degeneration Mother   . Prostate cancer Father   . Cervical cancer Maternal Grandmother   . Healthy Daughter    Past Surgical History:  Procedure Laterality Date  . ABDOMINAL HYSTERECTOMY    . BREAST SURGERY    . PELVIC LAPAROSCOPY     Social History   Social History Narrative  . Not on file     Objective: Vital Signs: BP 122/73 (BP Location: Left Arm, Patient Position: Sitting, Cuff Size: Normal)   Pulse (!) 58   Ht 5\' 6"  (1.676 m)   Wt 136 lb (61.7 kg)   LMP 10/11/2012   BMI 21.95 kg/m    Physical Exam  Constitutional: She is oriented to person, place, and time. She appears well-developed and well-nourished.  HENT:  Head: Normocephalic and atraumatic.  No oral or nasal ulcers noted. No parotid swelling.  Eyes: Conjunctivae and EOM are normal.  Neck: Normal range of motion.  Cardiovascular: Normal rate, regular rhythm, normal heart sounds and intact distal pulses.  Pulmonary/Chest: Effort normal and breath sounds normal.  Abdominal: Soft. Bowel sounds are normal.  Lymphadenopathy:    She has no cervical adenopathy.  Neurological: She is alert and oriented to person, place, and time.  Skin: Skin is warm and dry. Capillary refill takes less than 2 seconds.  No digital ulcerations noted. Erythematous rash noted on lower legs and dorsal aspect of left hand.  Psychiatric: She has a normal mood and affect. Her behavior is normal.  Nursing note and vitals reviewed.    Musculoskeletal Exam: C-spine, thoracic spine, lumbar spine good range of motion.  No midline spinal tenderness.  No SI joint tenderness.  Shoulder joints, elbow joints, wrist joints, MCPs, PIPs, DIPs good range of motion with no synovitis.  Hip joints, knee joints, ankle joints, MTPs, PIPs, DIPs good range of motion with no synovitis.  No warmth or effusion of  bilateral knees.  No knee crepitus. No trochanteric bursa tenderness.   CDAI Exam: No CDAI exam completed.    Investigation: No additional findings. CBC Latest Ref Rng & Units 03/28/2017 10/24/2016 07/17/2016  WBC 3.8 - 10.8 Thousand/uL 5.0 4.0 6.7  Hemoglobin 11.7 - 15.5 g/dL 13.0 12.8 12.6  Hematocrit 35.0 - 45.0 % 38.4 37.8 36.6  Platelets 140 - 400 Thousand/uL 257 239 261   CMP Latest Ref Rng & Units 03/28/2017 10/24/2016 07/17/2016  Glucose 65 - 99 mg/dL 67 87 141(H)  BUN 7 - 25 mg/dL 10 10 10   Creatinine 0.50 - 1.05 mg/dL 0.85 0.86 0.74  Sodium 135 - 146 mmol/L 138 140 141  Potassium 3.5 - 5.3 mmol/L 4.6 4.7 3.8  Chloride 98 - 110 mmol/L 104 107 101  CO2 20 - 32 mmol/L 26 25 24   Calcium 8.6 - 10.4 mg/dL 9.7 9.5 9.3  Total Protein 6.1 - 8.1 g/dL 6.7 6.5 6.2  Total Bilirubin 0.2 - 1.2 mg/dL 0.5 0.5 0.4  Alkaline Phos 33 - 130 U/L - 28(L) 30(L)  AST 10 - 35 U/L 19 20 18   ALT  6 - 29 U/L 18 19 18     Imaging: Mm Screening Breast W/implant Tomo Bilateral  Result Date: 08/19/2017 CLINICAL DATA:  Screening. EXAM: DIGITAL SCREENING BILATERAL MAMMOGRAM WITH IMPLANTS, CAD AND TOMO The patient has retropectoral implants. Standard and implant displaced views were performed. COMPARISON:  Previous exam(s). ACR Breast Density Category c: The breast tissue is heterogeneously dense, which may obscure small masses. FINDINGS: There are no findings suspicious for malignancy. Images were processed with CAD. IMPRESSION: No mammographic evidence of malignancy. A result letter of this screening mammogram will be mailed directly to the patient. RECOMMENDATION: Screening mammogram in one year. (Code:SM-B-01Y) BI-RADS CATEGORY  1:  Negative. Electronically Signed   By: Lovey Newcomer M.D.   On: 08/19/2017 12:00    Speciality Comments: No specialty comments available.    Procedures:  No procedures performed Allergies: Penicillins; Latex; and Adhesive [tape]      Assessment / Plan:     Visit Diagnoses:  Autoimmune disease ([place]) - History of arthralgias, [name], positive aCL IgM46, positive beta 2 100. She has not had any recent flares.  She takes Plaquenil 200 mg 1 tablet daily. A refill was sent to the pharmacy.  She continues to have photosensitivity.  She recently went on a trip to [place], and she developed a erythematous rash on the dorsal aspect bilateral hands and lower legs.  She was advised to follow up with her dermatologist to see if this rash is related to lupus. She continues to have Raynaud's.  Autoimmune labs were ordered today.  If her labs reveal a flare, we will consider changing her medication.- Plan: CBC with Differential/Platelet, COMPLETE METABOLIC PANEL WITH GFR, VITAMIN D 25 Hydroxy (Vit-D Deficiency, Fractures), Urinalysis, Routine w reflex microscopic, ANA, Anti-DNA antibody, double-stranded, Sedimentation rate, C3 and C4  High risk medication use - PLQ 1 tablet daily. Eye exam: normal June 2018. CBC and CMP were ordered today to monitor for drug toxicity.- Plan: CBC with Differential/Platelet, COMPLETE METABOLIC PANEL WITH GFR  Raynaud's disease without gangrene:   No signs of digital ulcerations or gangrene on exam today.  We discussed Norvasc and nitroglycerin paste.  She would like to hold off at this time.  She will continue to wear gloves to keep her core body temperature warm.  Antiphospholipid antibody positive: She is on aspirin 2 tablets daily (162 mg).  Primary osteoarthritis of both hands: She has occasional discomfort in her bilateral hands. She has no synovitis or tenderness on exam.  Primary osteoarthritis of both feet: She has mild PIP and DIP synovial thickening consistent with osteoarthritis.  She has occasional discomfort in her feet.  No synovitis on exam.   Other medical conditions are listed as follows:   Hx of migraines  History of colonic polyps  Palpitations    Orders: Orders Placed This Encounter  Procedures  . CBC with  Differential/Platelet  . COMPLETE METABOLIC PANEL WITH GFR  . VITAMIN D 25 Hydroxy (Vit-D Deficiency, Fractures)  . Urinalysis, Routine w reflex microscopic  . ANA  . Anti-DNA antibody, double-stranded  . Sedimentation rate  . C3 and C4   Meds ordered this encounter  Medications  . hydroxychloroquine (PLAQUENIL) 200 MG tablet    Sig: Take 1 tablet by mouth daily    Dispense:  135 tablet    Refill:  1    Face-to-face time spent with patient was 30 minutes. >50% of time was spent in counseling and coordination of care.  Follow-Up Instructions: Return in about 5 months (around  02/05/2018) for Autoimmune Disease, Osteoarthritis.   Ofilia Neas, PA-C   I examined and evaluated the patient with Hazel Sams PA. The plan of care was discussed as noted above.  Bo Merino, MD  Note - This record has been created using Editor, commissioning.  Chart creation errors have been sought, but may not always  have been located. Such creation errors do not reflect on  the standard of medical care.

## 2017-09-02 ENCOUNTER — Ambulatory Visit: Payer: 59 | Admitting: Physician Assistant

## 2017-09-05 ENCOUNTER — Encounter: Payer: Self-pay | Admitting: Physician Assistant

## 2017-09-05 ENCOUNTER — Ambulatory Visit: Payer: 59 | Admitting: Physician Assistant

## 2017-09-05 VITALS — BP 122/73 | HR 58 | Ht 66.0 in | Wt 136.0 lb

## 2017-09-05 DIAGNOSIS — Z8669 Personal history of other diseases of the nervous system and sense organs: Secondary | ICD-10-CM | POA: Diagnosis not present

## 2017-09-05 DIAGNOSIS — M19042 Primary osteoarthritis, left hand: Secondary | ICD-10-CM | POA: Diagnosis not present

## 2017-09-05 DIAGNOSIS — M19071 Primary osteoarthritis, right ankle and foot: Secondary | ICD-10-CM | POA: Diagnosis not present

## 2017-09-05 DIAGNOSIS — D8989 Other specified disorders involving the immune mechanism, not elsewhere classified: Secondary | ICD-10-CM

## 2017-09-05 DIAGNOSIS — M19072 Primary osteoarthritis, left ankle and foot: Secondary | ICD-10-CM | POA: Diagnosis not present

## 2017-09-05 DIAGNOSIS — R002 Palpitations: Secondary | ICD-10-CM

## 2017-09-05 DIAGNOSIS — M19041 Primary osteoarthritis, right hand: Secondary | ICD-10-CM

## 2017-09-05 DIAGNOSIS — Z86010 Personal history of colonic polyps: Secondary | ICD-10-CM

## 2017-09-05 DIAGNOSIS — R760 Raised antibody titer: Secondary | ICD-10-CM | POA: Diagnosis not present

## 2017-09-05 DIAGNOSIS — M359 Systemic involvement of connective tissue, unspecified: Secondary | ICD-10-CM

## 2017-09-05 DIAGNOSIS — I7300 Raynaud's syndrome without gangrene: Secondary | ICD-10-CM | POA: Diagnosis not present

## 2017-09-05 DIAGNOSIS — Z79899 Other long term (current) drug therapy: Secondary | ICD-10-CM

## 2017-09-05 MED ORDER — HYDROXYCHLOROQUINE SULFATE 200 MG PO TABS: ORAL_TABLET | ORAL | 1 refills | Status: DC

## 2017-09-07 LAB — ANA: ANA: POSITIVE — AB

## 2017-09-08 LAB — URINALYSIS, ROUTINE W REFLEX MICROSCOPIC
BILIRUBIN URINE: NEGATIVE
GLUCOSE, UA: NEGATIVE
HGB URINE DIPSTICK: NEGATIVE
Ketones, ur: NEGATIVE
Leukocytes, UA: NEGATIVE
Nitrite: NEGATIVE
PH: 6 (ref 5.0–8.0)
Protein, ur: NEGATIVE
Specific Gravity, Urine: 1.023 (ref 1.001–1.03)

## 2017-09-08 LAB — ANTI-NUCLEAR AB-TITER (ANA TITER): ANA Titer 1: 1:80 {titer} — ABNORMAL HIGH

## 2017-09-08 LAB — COMPLETE METABOLIC PANEL WITH GFR
AG Ratio: 2.1 (calc) (ref 1.0–2.5)
ALKALINE PHOSPHATASE (APISO): 32 U/L — AB (ref 33–130)
ALT: 17 U/L (ref 6–29)
AST: 17 U/L (ref 10–35)
Albumin: 4.5 g/dL (ref 3.6–5.1)
BILIRUBIN TOTAL: 0.5 mg/dL (ref 0.2–1.2)
BUN: 12 mg/dL (ref 7–25)
CHLORIDE: 105 mmol/L (ref 98–110)
CO2: 29 mmol/L (ref 20–32)
Calcium: 9.6 mg/dL (ref 8.6–10.4)
Creat: 0.93 mg/dL (ref 0.50–1.05)
GFR, Est African American: 80 mL/min/{1.73_m2} (ref >=60)
GFR, Est Non African American: 69 mL/min/{1.73_m2} (ref >=60)
GLOBULIN: 2.1 g/dL (ref 1.9–3.7)
GLUCOSE: 83 mg/dL (ref 65–99)
Potassium: 4.2 mmol/L (ref 3.5–5.3)
SODIUM: 140 mmol/L (ref 135–146)
Total Protein: 6.6 g/dL (ref 6.1–8.1)

## 2017-09-08 LAB — VITAMIN D 25 HYDROXY (VIT D DEFICIENCY, FRACTURES): VIT D 25 HYDROXY: 45 ng/mL (ref 30–100)

## 2017-09-08 LAB — CBC WITH DIFFERENTIAL/PLATELET
BASOS ABS: 29 {cells}/uL (ref 0–200)
Basophils Relative: 0.6 %
Eosinophils Absolute: 69 cells/uL (ref 15–500)
Eosinophils Relative: 1.4 %
HEMATOCRIT: 36.7 % (ref 35.0–45.0)
Hemoglobin: 12.6 g/dL (ref 11.7–15.5)
LYMPHS ABS: 1343 {cells}/uL (ref 850–3900)
MCH: 28.6 pg (ref 27.0–33.0)
MCHC: 34.3 g/dL (ref 32.0–36.0)
MCV: 83.4 fL (ref 80.0–100.0)
MPV: 11 fL (ref 7.5–12.5)
Monocytes Relative: 7.3 %
NEUTROS PCT: 63.3 %
Neutro Abs: 3102 cells/uL (ref 1500–7800)
Platelets: 273 10*3/uL (ref 140–400)
RBC: 4.4 10*6/uL (ref 3.80–5.10)
RDW: 12.6 % (ref 11.0–15.0)
Total Lymphocyte: 27.4 %
WBC mixed population: 358 cells/uL (ref 200–950)
WBC: 4.9 10*3/uL (ref 3.8–10.8)

## 2017-09-08 LAB — ANTI-DNA ANTIBODY, DOUBLE-STRANDED

## 2017-09-08 LAB — SEDIMENTATION RATE: SED RATE: 6 mm/h (ref 0–30)

## 2017-09-08 LAB — C3 AND C4
C3 Complement: 89 mg/dL (ref 83–193)
C4 Complement: 12 mg/dL — ABNORMAL LOW (ref 15–57)

## 2017-09-08 LAB — ANA IFA W/REFL: Anti Nuclear Antibody(ANA): POSITIVE — AB

## 2017-09-11 NOTE — Progress Notes (Signed)
Vitamin D is not in desirable range.  With her history of lupus, we would like her vitamin D between 50-60.  Please send in vitamin D 50,000 units once a week x3 months.  We will recheck in 3 months.    All other autoimmune labs are stable.

## 2017-09-22 ENCOUNTER — Telehealth: Payer: Self-pay | Admitting: Rheumatology

## 2017-09-22 DIAGNOSIS — E559 Vitamin D deficiency, unspecified: Secondary | ICD-10-CM

## 2017-09-22 MED ORDER — VITAMIN D (ERGOCALCIFEROL) 1.25 MG (50000 UNIT) PO CAPS: 50000.0000 [IU] | ORAL_CAPSULE | ORAL | 0 refills | Status: DC

## 2017-09-22 NOTE — Telephone Encounter (Signed)
Patient advised of lab results and prescription sent to the pharmacy.  

## 2017-09-22 NOTE — Telephone Encounter (Signed)
Patient called stating that she was returning your call.

## 2017-09-22 NOTE — Telephone Encounter (Signed)
-----   Message from Ofilia Neas, PA-C sent at 09/11/2017  5:22 PM EDT ----- Vitamin D is not in desirable range.  With her history of lupus, we would like her vitamin D between 50-60.  Please send in vitamin D 50,000 units once a week x3 months.  We will recheck in 3 months.    All other autoimmune labs are stable.

## 2017-10-20 ENCOUNTER — Encounter: Payer: Self-pay | Admitting: Obstetrics & Gynecology

## 2017-10-21 ENCOUNTER — Other Ambulatory Visit: Payer: Self-pay | Admitting: Rheumatology

## 2017-10-21 NOTE — Telephone Encounter (Signed)
Last Visit: 09/05/17 Next Visit" 02/06/18 Labs: 09/05/17 cbc/cmp wnl PLQ Eye exam :11/22/16 WNL   Okay to refill per Dr. Estanislado Pandy

## 2017-11-07 ENCOUNTER — Ambulatory Visit (INDEPENDENT_AMBULATORY_CARE_PROVIDER_SITE_OTHER): Payer: 59 | Admitting: Rheumatology

## 2017-11-07 ENCOUNTER — Encounter: Payer: Self-pay | Admitting: Physician Assistant

## 2017-11-07 VITALS — BP 106/65 | HR 62 | Resp 12 | Ht 66.0 in | Wt 142.0 lb

## 2017-11-07 DIAGNOSIS — M19071 Primary osteoarthritis, right ankle and foot: Secondary | ICD-10-CM

## 2017-11-07 DIAGNOSIS — M19072 Primary osteoarthritis, left ankle and foot: Secondary | ICD-10-CM

## 2017-11-07 DIAGNOSIS — Z79899 Other long term (current) drug therapy: Secondary | ICD-10-CM

## 2017-11-07 DIAGNOSIS — M19042 Primary osteoarthritis, left hand: Secondary | ICD-10-CM

## 2017-11-07 DIAGNOSIS — I7300 Raynaud's syndrome without gangrene: Secondary | ICD-10-CM | POA: Diagnosis not present

## 2017-11-07 DIAGNOSIS — D8989 Other specified disorders involving the immune mechanism, not elsewhere classified: Secondary | ICD-10-CM

## 2017-11-07 DIAGNOSIS — R760 Raised antibody titer: Secondary | ICD-10-CM | POA: Diagnosis not present

## 2017-11-07 DIAGNOSIS — Z86010 Personal history of colonic polyps: Secondary | ICD-10-CM | POA: Diagnosis not present

## 2017-11-07 DIAGNOSIS — M19041 Primary osteoarthritis, right hand: Secondary | ICD-10-CM

## 2017-11-07 DIAGNOSIS — M359 Systemic involvement of connective tissue, unspecified: Secondary | ICD-10-CM

## 2017-11-07 DIAGNOSIS — Z8669 Personal history of other diseases of the nervous system and sense organs: Secondary | ICD-10-CM

## 2017-11-07 NOTE — Progress Notes (Deleted)
Office Visit Note  Patient: Alexandra Garner             Date of Birth: 1961-04-24           MRN: [id number]             PCP: Patient, No Pcp Per Referring: No ref. provider found Visit Date: 11/07/2017 Occupation: @GUAROCC @    Subjective:  Other (blisters on left thumb )   History of Present Illness: Alexandra Garner is a 57 y.o. female ***   Activities of Daily Living:  Patient reports morning stiffness for *** {minute/hour:19697}.   Patient {ACTIONS;DENIES/REPORTS:21021675::"Denies"} nocturnal pain.  Difficulty dressing/grooming: {ACTIONS;DENIES/REPORTS:21021675::"Denies"} Difficulty climbing stairs: {ACTIONS;DENIES/REPORTS:21021675::"Denies"} Difficulty getting out of chair: {ACTIONS;DENIES/REPORTS:21021675::"Denies"} Difficulty using hands for taps, buttons, cutlery, and/or writing: {ACTIONS;DENIES/REPORTS:21021675::"Denies"}   No Rheumatology ROS completed.   PMFS History:  Patient Active Problem List   Diagnosis Date Noted  . History of migraine 11/07/2017  . Autoimmune disease ([place]) 10/20/2016  . High risk medication use 09/26/2016  . Positive cardiolipin antibodies 09/19/2016  . Raynaud's disease without gangrene 09/19/2016  . Primary osteoarthritis of both hands 09/19/2016  . Primary osteoarthritis of both feet 09/19/2016  . 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  . Family history of macular degeneration 02/03/2015  . FLATULENCE-GAS-BLOATING 11/29/2008  . HEMORRHOIDS-INTERNAL 11/01/2008  . HEMORRHOIDS-EXTERNAL 11/01/2008  . History of colonic polyps 11/01/2008  . RECTAL BLEEDING 01/04/2008  . ABDOMINAL PAIN-GENERALIZED 01/04/2008    Past Medical History:  Diagnosis Date  . 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 Fibroid   . Hand pain    left hand-had 2 cortisone injections  . Hx of migraines     Family History  Problem Relation Age of Onset  . Clotting disorder Mother   . Macular degeneration Mother   . Prostate cancer Father   .  Cervical cancer Maternal Grandmother   . Healthy Daughter    Past Surgical History:  Procedure Laterality Date  . ABDOMINAL HYSTERECTOMY    . BREAST SURGERY    . PELVIC LAPAROSCOPY     Social History   Social History Narrative  . Not on file     Objective: Vital Signs: BP 106/65 (BP Location: Left Arm, Patient Position: Sitting, Cuff Size: Normal)   Pulse 62   Resp 12   Ht 5' 6"  (1.676 m)   Wt 142 lb (64.4 kg)   LMP 10/11/2012   BMI 22.92 kg/m    Physical Exam   Musculoskeletal Exam: ***  CDAI Exam: No CDAI exam completed.    Investigation: No additional findings. CBC Latest Ref Rng & Units 09/05/2017 03/28/2017 10/24/2016  WBC 3.8 - 10.8 Thousand/uL 4.9 5.0 4.0  Hemoglobin 11.7 - 15.5 g/dL 12.6 13.0 12.8  Hematocrit 35.0 - 45.0 % 36.7 38.4 37.8  Platelets 140 - 400 Thousand/uL 273 257 239   CMP     Component Value Date/Time   NA 140 09/05/2017 0932   NA 141 07/17/2016 1434   K 4.2 09/05/2017 0932   CL 105 09/05/2017 0932   CO2 29 09/05/2017 0932   GLUCOSE 83 09/05/2017 0932   BUN 12 09/05/2017 0932   BUN 10 07/17/2016 1434   CREATININE 0.93 09/05/2017 0932   CALCIUM 9.6 09/05/2017 0932   PROT 6.6 09/05/2017 0932   PROT 6.2 07/17/2016 1434   ALBUMIN 4.1 10/24/2016 0927   ALBUMIN 4.3 07/17/2016 1434   AST 17 09/05/2017 0932   ALT 17 09/05/2017 0932   ALKPHOS 28 (  L) 10/24/2016 0927   BILITOT 0.5 09/05/2017 0932   BILITOT 0.4 07/17/2016 1434   GFRNONAA 69 09/05/2017 0932   GFRAA 80 09/05/2017 0932   September 05, 2017 UA negative, C4 low at 12, ANA 1: 80 nucleolar, dsDNA negative, ESR 6, vitamin D 45 Imaging: No results found.  Speciality Comments: No specialty comments available.    Procedures:  No procedures performed Allergies: Penicillins; Latex; and Adhesive [tape]   Assessment / Plan:     Visit Diagnoses: Autoimmune disease ([place]) - History of arthralgias, [name], photosensitivity, positive aCL IgM46, positive beta 2 100.  Raynaud's  disease without gangrene  Antiphospholipid antibody positive  High risk medication use - Plaquenil  Primary osteoarthritis of both hands  Primary osteoarthritis of both feet  History of migraine  History of colonic polyps    Orders: No orders of the defined types were placed in this encounter.  No orders of the defined types were placed in this encounter.   Face-to-face time spent with patient was *** minutes. 50% of time was spent in counseling and coordination of care.  Follow-Up Instructions: No follow-ups on file.   Bo Merino, MD  Note - This record has been created using Editor, commissioning.  Chart creation errors have been sought, but may not always  have been located. Such creation errors do not reflect on  the standard of medical care.

## 2017-11-07 NOTE — Progress Notes (Signed)
Office Visit Note  Patient: Alexandra Garner             Date of Birth: April 19, 1961           MRN: [id number]             PCP: Patient, No Pcp Per Referring: No ref. provider found Visit Date: 11/07/2017 Occupation: @GUAROCC @    Subjective:  Left thumb sore   History of Present Illness: Alexandra Garner is a 57 y.o. female with history of autoimmune disease, Raynaud's, and osteoarthritis.  Patient continues to take Plaquenil 200 mg 1 tablet by mouth daily.  She reports she continues to have symptoms of Raynaud's in her hands.  She denies any digital ulcerations.  She states that she has developed an area of skin dryness and cracking on her left thumb.  She reports that this area dryness comes and goes and she typically uses Aquaphor and wraps it in a gauze wrap.  He states that she tries to avoid using hands sanitizer or other skin products.  She states that she also developed some dryness on her bilateral heels.  Straight psoriasis or eczema.  She denies any Achilles tendinitis or plantar fasciitis.  She states that she has discomfort at times in bilateral knees and has been unable to run lately.  She reports that she continues to get sores in her nose but no mouth sores.  She states that she uses Vaseline on her nose sores.  She states that she continues to take 2 tablets of aspirin daily.   Activities of Daily Living:  Patient reports morning stiffness for 1  minute.   Patient Denies nocturnal pain.  Difficulty dressing/grooming: Denies Difficulty climbing stairs: Denies Difficulty getting out of chair: Denies Difficulty using hands for taps, buttons, cutlery, and/or writing: Denies   Review of Systems  Constitutional: Negative for fatigue.  HENT: Negative for mouth sores, mouth dryness and nose dryness.   Eyes: Negative for pain, visual disturbance and dryness.  Respiratory: Negative for cough, hemoptysis, shortness of breath and difficulty breathing.   Cardiovascular: Positive for  swelling in legs/feet. Negative for chest pain, palpitations and hypertension.  Gastrointestinal: Negative for blood in stool, constipation and diarrhea.  Endocrine: Negative for increased urination.  Genitourinary: Negative for painful urination.  Musculoskeletal: Positive for arthralgias and joint pain. Negative for joint swelling, myalgias, muscle weakness, morning stiffness, muscle tenderness and myalgias.  Skin: Positive for color change and rash. Negative for pallor, hair loss, nodules/bumps, skin tightness, ulcers and sensitivity to sunlight.  Allergic/Immunologic: Negative for susceptible to infections.  Neurological: Negative for dizziness, numbness, headaches and weakness.  Hematological: Negative for swollen glands.  Psychiatric/Behavioral: Negative for depressed mood and sleep disturbance. The patient is not nervous/anxious.     PMFS History:  Patient Active Problem List   Diagnosis Date Noted  . History of migraine 11/07/2017  . Autoimmune disease ([place]) 10/20/2016  . High risk medication use 09/26/2016  . Positive cardiolipin antibodies 09/19/2016  . Raynaud's disease without gangrene 09/19/2016  . Primary osteoarthritis of both hands 09/19/2016  . Primary osteoarthritis of both feet 09/19/2016  . 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  . Family history of macular degeneration 02/03/2015  . FLATULENCE-GAS-BLOATING 11/29/2008  . HEMORRHOIDS-INTERNAL 11/01/2008  . HEMORRHOIDS-EXTERNAL 11/01/2008  . History of colonic polyps 11/01/2008  . RECTAL BLEEDING 01/04/2008  . ABDOMINAL PAIN-GENERALIZED 01/04/2008    Past Medical History:  Diagnosis Date  . 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  Fibroid   . Hand pain    left hand-had 2 cortisone injections  . Hx of migraines     Family History  Problem Relation Age of Onset  . Clotting disorder Mother   . Macular degeneration Mother   . Prostate cancer Father   . Cervical cancer Maternal Grandmother     . Healthy Daughter    Past Surgical History:  Procedure Laterality Date  . ABDOMINAL HYSTERECTOMY    . BREAST SURGERY    . PELVIC LAPAROSCOPY     Social History   Social History Narrative  . Not on file     Objective: Vital Signs: BP 106/65 (BP Location: Left Arm, Patient Position: Sitting, Cuff Size: Normal)   Pulse 62   Resp 12   Ht 5\' 6"  (1.676 m)   Wt 142 lb (64.4 kg)   LMP 10/11/2012   BMI 22.92 kg/m    Physical Exam  Constitutional: She is oriented to person, place, and time. She appears well-developed and well-nourished.  HENT:  Head: Normocephalic and atraumatic.  Nasal ulcerations noted.  No oral ulcerations.  Eyes: Conjunctivae and EOM are normal.  Neck: Normal range of motion.  Cardiovascular: Normal rate, regular rhythm, normal heart sounds and intact distal pulses.  Pulmonary/Chest: Effort normal and breath sounds normal.  Abdominal: Soft. Bowel sounds are normal.  Lymphadenopathy:    She has no cervical adenopathy.  Neurological: She is alert and oriented to person, place, and time.  Skin: Skin is warm and dry. Capillary refill takes less than 2 seconds.  Left elbow erythema and skin dryness  Left thumb skin dryness and cracking  No digital ulcerations   Psychiatric: She has a normal mood and affect. Her behavior is normal.  Nursing note and vitals reviewed.    Musculoskeletal Exam: C-spine, thoracic spine, lumbar spine good range of motion.  No midline spinal tenderness.  No SI joint tenderness.  Shoulder joints, elbow joints, wrist joints, MCPs, PIPs DIPs good range of motion with no synovitis.  Hip joints, Knee joints, ankle joints, and PIPs, DIPs good range of motion with no synovitis.  No warmth or effusion of the joints.  No tenderness of trochanteric bursa.  No Achilles tendinitis or plantar fasciitis.  CDAI Exam: No CDAI exam completed.    Investigation: No additional findings. CBC Latest Ref Rng & Units 09/05/2017 03/28/2017 10/24/2016  WBC  3.8 - 10.8 Thousand/uL 4.9 5.0 4.0  Hemoglobin 11.7 - 15.5 g/dL 12.6 13.0 12.8  Hematocrit 35.0 - 45.0 % 36.7 38.4 37.8  Platelets 140 - 400 Thousand/uL 273 257 239   CMP Latest Ref Rng & Units 09/05/2017 03/28/2017 10/24/2016  Glucose 65 - 99 mg/dL 83 67 87  BUN 7 - 25 mg/dL 12 10 10   Creatinine 0.50 - 1.05 mg/dL 0.93 0.85 0.86  Sodium 135 - 146 mmol/L 140 138 140  Potassium 3.5 - 5.3 mmol/L 4.2 4.6 4.7  Chloride 98 - 110 mmol/L 105 104 107  CO2 20 - 32 mmol/L 29 26 25   Calcium 8.6 - 10.4 mg/dL 9.6 9.7 9.5  Total Protein 6.1 - 8.1 g/dL 6.6 6.7 6.5  Total Bilirubin 0.2 - 1.2 mg/dL 0.5 0.5 0.5  Alkaline Phos 33 - 130 U/L - - 28(L)  AST 10 - 35 U/L 17 19 20   ALT 6 - 29 U/L 17 18 19     Imaging: No results found.  Speciality Comments: No specialty comments available.    Procedures:  No procedures performed Allergies: Penicillins; Latex; and  Adhesive [tape]   Assessment / Plan:     Visit Diagnoses: Autoimmune disease ([place]) - History of arthralgias, [name], photosensitivity, positive aCL IgM46, positive beta 2 100:  She is clinically been doing well overall.  She continues to take Plaquenil 1 tablet daily.  She continues to have occasional nasal ulcerations but no oral ulcerations.  Nasal ulcerations resolved with application of Vaseline.  She continues to have symptoms of [name] in her fingers and toes.  She has no signs of digital ulcerations but she does have an area of skin dryness and cracking on her left thumb.  She was advised to follow-up with her dermatologist for evaluation.  She does not have a known history of psoriasis or eczema.  She can continue using Aquaphor and wrapping her thumb in gauze.  She has not had any sicca symptoms.  No cervical lymphadenopathy was palpated.  Her autoimmune labs were stable on 09/05/2017.  She will return in June to have her vitamin D level checked.  She will continue on her current treatment regimen.  Raynaud's disease without gangrene: She  continues to have symptoms of [name] in her hands.  She has no signs of digital ulcerations.  Antiphospholipid antibody positive: She takes aspirin 162 mg by mouth daily.  High risk medication use - Plaquenil.  She had CBC and CMP drawn in March.  She will have lab work at her next follow up appointment.    Primary osteoarthritis of both hands: She continues to have PIP and DIP synovial thickening consistent with osteoarthritis of bilateral hands.  Joint protection muscle strengthening were discussed.  Primary osteoarthritis of both feet: She has mild PIP and DIP synovial thickening consistent with ulcer arthritis of bilateral feet.  She is not experiencing any discomfort in her feet at this time.  We discussed the importance of wearing proper fitting shoes.  Other medical conditions are listed as follows:  History of migraine  History of colonic polyps    Orders: No orders of the defined types were placed in this encounter.  No orders of the defined types were placed in this encounter.   Face-to-face time spent with patient was 30 minutes. >50% of time was spent in counseling and coordination of care.  Follow-Up Instructions: Return in about 5 months (around 04/09/2018) for Autoimmune Disease, Raynaud's syndrome, Osteoarthritis.   Ofilia Neas, PA-C   I examined and evaluated the patient with Hazel Sams PA.  Patient had dry scales on her left thumb.  She also had some erythema on her left elbow and dry skin on my examination.  Dry scales on her feet.  I am uncertain if it is eczema or psoriasis.  I took some pictures from her on her cell phone.  I have also advised her to see her dermatologist.  The plan of care was discussed as noted above.  Bo Merino, MD  Note - This record has been created using Editor, commissioning.  Chart creation errors have been sought, but may not always  have been located. Such creation errors do not reflect on  the standard of medical care.

## 2017-11-14 ENCOUNTER — Encounter: Payer: Self-pay | Admitting: Family Medicine

## 2017-11-14 ENCOUNTER — Other Ambulatory Visit (HOSPITAL_COMMUNITY)
Admission: RE | Admit: 2017-11-14 | Discharge: 2017-11-14 | Disposition: A | Discharge status: Home / Self Care | Payer: 59 | Source: Ambulatory Visit | Attending: Family Medicine | Admitting: Family Medicine

## 2017-11-14 ENCOUNTER — Ambulatory Visit: Payer: 59 | Admitting: Family Medicine

## 2017-11-14 ENCOUNTER — Other Ambulatory Visit: Payer: Self-pay

## 2017-11-14 VITALS — BP 102/62 | HR 64 | Temp 98.0°F | Ht 66.25 in | Wt 138.6 lb

## 2017-11-14 DIAGNOSIS — B379 Candidiasis, unspecified: Secondary | ICD-10-CM | POA: Insufficient documentation

## 2017-11-14 DIAGNOSIS — I7300 Raynaud's syndrome without gangrene: Secondary | ICD-10-CM | POA: Diagnosis not present

## 2017-11-14 DIAGNOSIS — D6861 Antiphospholipid syndrome: Secondary | ICD-10-CM | POA: Insufficient documentation

## 2017-11-14 DIAGNOSIS — N898 Other specified noninflammatory disorders of vagina: Secondary | ICD-10-CM | POA: Diagnosis not present

## 2017-11-14 DIAGNOSIS — R202 Paresthesia of skin: Secondary | ICD-10-CM | POA: Diagnosis not present

## 2017-11-14 DIAGNOSIS — L301 Dyshidrosis [pompholyx]: Secondary | ICD-10-CM

## 2017-11-14 DIAGNOSIS — Z79890 Hormone replacement therapy (postmenopausal): Secondary | ICD-10-CM

## 2017-11-14 DIAGNOSIS — M546 Pain in thoracic spine: Secondary | ICD-10-CM

## 2017-11-14 MED ORDER — TRIAMCINOLONE ACETONIDE 0.1 % EX CREA: 1.0000 "application " | TOPICAL_CREAM | Freq: Two times a day (BID) | CUTANEOUS | 0 refills | Status: DC

## 2017-11-14 NOTE — Patient Instructions (Signed)
Please return in 1-6 months for your annual complete physical; please come fasting.   It was a pleasure meeting you today! Thank you for choosing [place] to meet your healthcare needs! I truly look forward to working with you. If you have any questions or concerns, please send me a message via Mychart or call the office at [phone].  I will release your lab results to you on your MyChart account with further instructions. Please reply with any questions.   Please use advil or alleve as needed for your back pain and let me know if it does not get better.  You can use ITCH-X - a topical gel to help treat the irritative itching on your back skin.

## 2017-11-14 NOTE — Progress Notes (Signed)
Subjective  CC:  Chief Complaint  Patient presents with  . Establish Care    Transfer from [facility], last Physical 3-4 years ago  . Back Pain    numbness & itchy in middle back x 7 days     HPI: Alexandra Garner is a 57 y.o. female who presents to [place] at [facility] today to establish care with me as a new patient.  I've personally reviewed recent office visit notes, hospital notes, associated labs and imaging reports and/or pertinent outside office records via chart review or CareEverywhere. Briefly, has raynauds and possible antiphospholipid syndrome being treated with plaquenil by Dr. [place]. Doing well. Main issues are pain in hands and color changes that is much better on meds. No h/o hypercoaguable state or sxs or problems. No autoimmune d/o in family history. Healthy lifestyle. 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  Has a teen female to female transgender son. All are doing well.   Last GYN CPE was 07/2017. mammo utd.  Saw rheum last month with lab work reviewed.   She has the following concerns or needs:  Itching in subscapular area on left and now with thoracic midline back pain over last week; worse with prolonged standing. No radicular sxs, neck pain or low back pain. Has recently returned from travel from [facility] - had done a lot of walking etc. No pain when lying down. Hasn't taken any meds for pain. Has mild oa in neck; oa in hands and feet but no known low back djd.   Vaginal d/c x 1 week; no ithcing. ? Odor. No pain. No STD risk.  Rash on thumb; improving. ? Eczema  HRT: doing well - okd by hematology. Treats mood and hot flushes due to menopause.   Assessment  1. Notalgia paresthetica   2. Raynaud's disease without gangrene   3. Antiphospholipid syndrome ([place])   4. Dyshidrotic eczema   5. Acute midline thoracic back pain   6. Hormone replacement therapy (HRT)   7. Vaginal discharge      Plan    C/w notalgia paresthetica: educated on dx and tx. Antihistamine cream recommended.   Back pain: like msk and not related to itching. Advil, stretchiing, rest and return for further eval if not improved.   Eczema: edcuation given. Prn steroid cream  rhem manageing autoimmune disorders. Stable.   Test for BV/yeast and treat if positive.   HRT- continue same dose now. Consider lowering dosage in future.   Follow up:  cpe at pt's convenience Orders Placed This Encounter  Procedures  . HM COLONOSCOPY   Meds ordered this encounter  Medications  . triamcinolone cream (KENALOG) 0.1 %    Sig: Apply 1 application topically 2 (two) times daily. For 2 weeks, then as needed    Dispense:  45 g    Refill:  0      We updated and reviewed the patient's past history in detail and it is documented below.  Patient Active Problem List   Diagnosis Date Noted  . Dyshidrotic eczema 11/14/2017  . Antiphospholipid syndrome ([place]) 11/14/2017  . 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 Hormone replacement therapy (HRT) 11/14/2017  . History of migraine 11/07/2017  . Autoimmune disease ([place]) 10/20/2016    History of arthralgias, [name], positive aCL IgM46, positive beta 2 100   . High risk medication use 09/26/2016    Plaquenil 200 mg 1 tablet a.m. and half tablet p.m.  PLQ  Eye Exam: 11/22/16 WNL    . Raynaud's disease without gangrene 09/19/2016  . Primary osteoarthritis of both feet 09/19/2016  . Family history of macular degeneration 02/03/2015    Grandmother and early findings in mother   . Primary localized osteoarthrosis of hand 12/23/2013  . History of colonic polyps 11/01/2008    Qualifier: Diagnosis of  By: Shane Crutch, Amy S     Health Maintenance  Topic Date Due  . HIV Screening  06/[birth year]/1977  . INFLUENZA VACCINE  01/22/2018  . MAMMOGRAM  08/19/2018  . COLONOSCOPY  03/24/2020  . TETANUS/TDAP  09/30/2021  . Hepatitis C Screening  Completed   Immunization History   Administered Date(s) Administered  . Tdap 10/01/2011   No outpatient medications have been marked as taking for the 11/14/17 encounter (Office Visit) with Leamon Arnt, MD.    Allergies: Patient is allergic to penicillins; latex; and adhesive [tape]. Past Medical History Patient  has a past medical history of Antiphospholipid antibody positive (03/29/2016), Autoimmune disease ([place]), Fibroid, Hand pain, and migraines. Past Surgical History Patient  has a past surgical history that includes Breast surgery; Abdominal hysterectomy; and Pelvic laparoscopy. Family History: Patient family history includes Cervical cancer in her maternal grandmother; Clotting disorder in her mother; Healthy in her daughter; Macular degeneration in her mother; Prostate cancer in her father. Social History:  Patient  reports that she has never smoked. She has never used smokeless tobacco. She reports that she drinks about 0.6 - 1.2 oz of alcohol per week. She reports that she does not use drugs.  Review of Systems: Constitutional: negative for fever or malaise Ophthalmic: negative for photophobia, double vision or loss of vision Cardiovascular: negative for chest pain, dyspnea on exertion, or new LE swelling Respiratory: negative for SOB or persistent cough Gastrointestinal: negative for abdominal pain, change in bowel habits or melena Genitourinary: negative for dysuria or gross hematuria Musculoskeletal: negative for new gait disturbance or muscular weakness Integumentary: negative for new or persistent rashes Neurological: negative for TIA or stroke symptoms Psychiatric: negative for SI or delusions Allergic/Immunologic: negative for hives  Patient Care Team    Relationship Specialty Notifications Start End  Leamon Arnt, MD PCP - General Family Medicine  11/14/17   [name], MD Consulting Physician Gynecology  11/14/17   Irene Shipper, MD Consulting Physician Gastroenterology  11/14/17    Bo Merino, MD Consulting Physician Rheumatology  11/14/17     Objective  Vitals: BP 102/62   Pulse 64   Temp 98 F (36.7 C)   Ht 5' 6.25" (1.683 m)   Wt 138 lb 9.6 oz (62.9 kg)   LMP 10/11/2012   BMI 22.20 kg/m  General:  Well developed, well nourished, no acute distress  Psych:  Alert and oriented,normal mood and affect HEENT:  Normocephalic, atraumatic, non-icteric sclera, supple neck without adenopathy, mass or thyromegaly Cardiovascular:  RRR without gallop, rub or murmur, nondisplaced PMI Respiratory:  Good breath sounds bilaterally, CTAB with normal respiratory effort Skin:  Warm,back left subscapular hyperpigmented oval 3x4cm area; left inner thumb with resolving flaking and redness MSK: spine is nontender. No mm spasm. Nl gait and movement, Neurologic:    Mental status is normal. Gross motor and sensory exams are normal. Normal gait   Commons side effects, risks, benefits, and alternatives for medications and treatment plan prescribed today were discussed, and the patient expressed understanding of the given instructions. Patient is instructed to call or message via MyChart if he/she has any questions  or concerns regarding our treatment plan. No barriers to understanding were identified. We discussed Red Flag symptoms and signs in detail. Patient expressed understanding regarding what to do in case of urgent or emergency type symptoms.   Medication list was reconciled, printed and provided to the patient in AVS. Patient instructions and summary information was reviewed with the patient as documented in the AVS. This note was prepared with assistance of Dragon voice recognition software. Occasional wrong-word or sound-a-like substitutions may have occurred due to the inherent limitations of voice recognition software

## 2017-11-18 LAB — CERVICOVAGINAL ANCILLARY ONLY
BACTERIAL VAGINITIS: NEGATIVE
CANDIDA VAGINITIS: POSITIVE — AB
TRICH (WINDOWPATH): NEGATIVE

## 2017-11-19 MED ORDER — FLUCONAZOLE 150 MG PO TABS: ORAL_TABLET | ORAL | 0 refills | Status: DC

## 2017-11-19 NOTE — Addendum Note (Signed)
Addended by: Billey Chang on: 11/19/2017 08:01 AM   Modules accepted: Orders

## 2017-12-13 ENCOUNTER — Other Ambulatory Visit: Payer: Self-pay | Admitting: Rheumatology

## 2017-12-15 NOTE — Telephone Encounter (Signed)
Patient advised she will need labs before she can refill.

## 2017-12-29 ENCOUNTER — Other Ambulatory Visit: Payer: Self-pay

## 2017-12-29 DIAGNOSIS — Z79899 Other long term (current) drug therapy: Secondary | ICD-10-CM | POA: Diagnosis not present

## 2017-12-29 DIAGNOSIS — E559 Vitamin D deficiency, unspecified: Secondary | ICD-10-CM | POA: Diagnosis not present

## 2017-12-30 LAB — COMPLETE METABOLIC PANEL WITH GFR
AG Ratio: 2 (calc) (ref 1.0–2.5)
ALT: 14 U/L (ref 6–29)
AST: 18 U/L (ref 10–35)
Albumin: 4.2 g/dL (ref 3.6–5.1)
Alkaline phosphatase (APISO): 32 U/L — ABNORMAL LOW (ref 33–130)
BUN: 13 mg/dL (ref 7–25)
CALCIUM: 9.2 mg/dL (ref 8.6–10.4)
CO2: 28 mmol/L (ref 20–32)
CREATININE: 0.78 mg/dL (ref 0.50–1.05)
Chloride: 106 mmol/L (ref 98–110)
GFR, EST NON AFRICAN AMERICAN: 86 mL/min/{1.73_m2} (ref >=60)
GFR, Est African American: 99 mL/min/{1.73_m2} (ref >=60)
GLOBULIN: 2.1 g/dL (ref 1.9–3.7)
Glucose, Bld: 91 mg/dL (ref 65–99)
Potassium: 4.2 mmol/L (ref 3.5–5.3)
Sodium: 140 mmol/L (ref 135–146)
Total Bilirubin: 0.5 mg/dL (ref 0.2–1.2)
Total Protein: 6.3 g/dL (ref 6.1–8.1)

## 2017-12-30 LAB — CBC WITH DIFFERENTIAL/PLATELET
Basophils Absolute: 22 cells/uL (ref 0–200)
Basophils Relative: 0.5 %
EOS PCT: 1.4 %
Eosinophils Absolute: 62 cells/uL (ref 15–500)
HCT: 35.7 % (ref 35.0–45.0)
Hemoglobin: 11.9 g/dL (ref 11.7–15.5)
Lymphs Abs: 1228 cells/uL (ref 850–3900)
MCH: 28.3 pg (ref 27.0–33.0)
MCHC: 33.3 g/dL (ref 32.0–36.0)
MCV: 85 fL (ref 80.0–100.0)
MONOS PCT: 8.5 %
MPV: 12.1 fL (ref 7.5–12.5)
NEUTROS PCT: 61.7 %
Neutro Abs: 2715 cells/uL (ref 1500–7800)
PLATELETS: 220 10*3/uL (ref 140–400)
RBC: 4.2 10*6/uL (ref 3.80–5.10)
RDW: 12.3 % (ref 11.0–15.0)
TOTAL LYMPHOCYTE: 27.9 %
WBC mixed population: 374 cells/uL (ref 200–950)
WBC: 4.4 10*3/uL (ref 3.8–10.8)

## 2017-12-30 LAB — VITAMIN D 25 HYDROXY (VIT D DEFICIENCY, FRACTURES): VIT D 25 HYDROXY: 41 ng/mL (ref 30–100)

## 2017-12-30 NOTE — Progress Notes (Signed)
Labs are stable.

## 2018-01-06 ENCOUNTER — Other Ambulatory Visit: Payer: Self-pay

## 2018-01-06 ENCOUNTER — Ambulatory Visit (INDEPENDENT_AMBULATORY_CARE_PROVIDER_SITE_OTHER): Payer: 59 | Admitting: Family Medicine

## 2018-01-06 ENCOUNTER — Other Ambulatory Visit (INDEPENDENT_AMBULATORY_CARE_PROVIDER_SITE_OTHER): Payer: 59

## 2018-01-06 ENCOUNTER — Encounter: Payer: Self-pay | Admitting: Family Medicine

## 2018-01-06 VITALS — BP 104/70 | HR 60 | Temp 97.7°F | Ht 66.25 in | Wt 136.0 lb

## 2018-01-06 DIAGNOSIS — R7989 Other specified abnormal findings of blood chemistry: Secondary | ICD-10-CM | POA: Diagnosis not present

## 2018-01-06 DIAGNOSIS — D6861 Antiphospholipid syndrome: Secondary | ICD-10-CM

## 2018-01-06 DIAGNOSIS — Z79890 Hormone replacement therapy (postmenopausal): Secondary | ICD-10-CM | POA: Diagnosis not present

## 2018-01-06 DIAGNOSIS — Z0000 Encounter for general adult medical examination without abnormal findings: Secondary | ICD-10-CM

## 2018-01-06 LAB — LIPID PANEL
CHOLESTEROL: 182 mg/dL (ref 0–200)
HDL: 73.3 mg/dL (ref >39.00)
LDL Cholesterol: 87 mg/dL (ref 0–99)
NONHDL: 108.97
Total CHOL/HDL Ratio: 2
Triglycerides: 112 mg/dL (ref 0.0–149.0)
VLDL: 22.4 mg/dL (ref 0.0–40.0)

## 2018-01-06 LAB — COMPREHENSIVE METABOLIC PANEL
ALBUMIN: 4.2 g/dL (ref 3.5–5.2)
ALK PHOS: 34 U/L — AB (ref 39–117)
ALT: 18 U/L (ref 0–35)
AST: 19 U/L (ref 0–37)
BUN: 15 mg/dL (ref 6–23)
CO2: 29 mEq/L (ref 19–32)
CREATININE: 0.82 mg/dL (ref 0.40–1.20)
Calcium: 9.3 mg/dL (ref 8.4–10.5)
Chloride: 105 mEq/L (ref 96–112)
GFR: 76.77 mL/min (ref >60.00)
GLUCOSE: 95 mg/dL (ref 70–99)
Potassium: 4.1 mEq/L (ref 3.5–5.1)
SODIUM: 140 meq/L (ref 135–145)
TOTAL PROTEIN: 6.4 g/dL (ref 6.0–8.3)
Total Bilirubin: 0.5 mg/dL (ref 0.2–1.2)

## 2018-01-06 LAB — CBC WITH DIFFERENTIAL/PLATELET
BASOS ABS: 0 10*3/uL (ref 0.0–0.1)
Basophils Relative: 0.5 % (ref 0.0–3.0)
EOS ABS: 0.1 10*3/uL (ref 0.0–0.7)
EOS PCT: 1.7 % (ref 0.0–5.0)
HCT: 36.4 % (ref 36.0–46.0)
HEMOGLOBIN: 12.5 g/dL (ref 12.0–15.0)
Lymphocytes Relative: 31.5 % (ref 12.0–46.0)
Lymphs Abs: 1.5 10*3/uL (ref 0.7–4.0)
MCHC: 34.4 g/dL (ref 30.0–36.0)
MCV: 84.8 fl (ref 78.0–100.0)
MONO ABS: 0.4 10*3/uL (ref 0.1–1.0)
Monocytes Relative: 7.1 % (ref 3.0–12.0)
Neutro Abs: 2.9 10*3/uL (ref 1.4–7.7)
Neutrophils Relative %: 59.2 % (ref 43.0–77.0)
Platelets: 234 10*3/uL (ref 150.0–400.0)
RBC: 4.29 Mil/uL (ref 3.87–5.11)
RDW: 12.6 % (ref 11.5–15.5)
WBC: 4.9 10*3/uL (ref 4.0–10.5)

## 2018-01-06 LAB — TSH: TSH: 5.37 u[IU]/mL — AB (ref 0.35–4.50)

## 2018-01-06 LAB — T3, FREE: T3, Free: 4.6 pg/mL — ABNORMAL HIGH (ref 2.3–4.2)

## 2018-01-06 LAB — T4, FREE: FREE T4: 0.89 ng/dL (ref 0.60–1.60)

## 2018-01-06 NOTE — Progress Notes (Signed)
Subjective  Chief Complaint  Patient presents with  . Annual Exam    doing well, complaints    HPI: Alexandra Garner is a 57 y.o. female who presents to [place] at [facility] today for a Female Wellness Visit.   Wellness Visit: annual visit with health maintenance review and exam without Pap   Doing well. No new concerns. Healthy lifestyle.  Assessment  1. Annual physical exam   2. Hormone replacement therapy (HRT)   3. Antiphospholipid syndrome [facility])      Plan  Female Wellness Visit:  Age appropriate Health Maintenance and Prevention measures were discussed with patient. Included topics are cancer screening recommendations, ways to keep healthy (see AVS) including dietary and exercise recommendations, regular eye and dental care, use of seat belts, and avoidance of moderate alcohol use and tobacco use.   BMI: discussed patient's BMI and encouraged positive lifestyle modifications to help get to or maintain a target BMI.  HM needs and immunizations were addressed and ordered. See below for orders. See HM and immunization section for updates.  Routine labs and screening tests ordered including cmp, cbc and lipids where appropriate.  Discussed recommendations regarding Vit D and calcium supplementation (see AVS)  Follow up: Return in about 1 year (around 01/07/2019) for complete physical.   Orders Placed This Encounter  Procedures  . CBC with Differential/Platelet  . Comprehensive metabolic panel  . Lipid panel  . HIV antibody  . TSH   No orders of the defined types were placed in this encounter.    Lifestyle: Body mass index is 21.79 kg/m. Wt Readings from Last 3 Encounters:  01/06/18 136 lb (61.7 kg)  11/14/17 138 lb 9.6 oz (62.9 kg)  11/07/17 142 lb (64.4 kg)     Patient Active Problem List   Diagnosis Date Noted  . Dyshidrotic eczema 11/14/2017  . Antiphospholipid syndrome ([place]) 11/14/2017  . 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 Hormone  replacement therapy (HRT) 11/14/2017  . History of migraine 11/07/2017  . Autoimmune disease ([place]) 10/20/2016    History of arthralgias, [name], positive aCL IgM46, positive beta 2 100   . High risk medication use 09/26/2016    Plaquenil 200 mg 1 tablet a.m. and half tablet p.m.  PLQ Eye Exam: 11/22/16 WNL    . Raynaud's disease without gangrene 09/19/2016  . Primary osteoarthritis of both feet 09/19/2016  . Family history of macular degeneration 02/03/2015    Grandmother and early findings in mother   . Primary localized osteoarthrosis of hand 12/23/2013  . History of colonic polyps 11/01/2008    Qualifier: Diagnosis of  By: Shane Crutch, Amy S     Health Maintenance  Topic Date Due  . HIV Screening  06/11/1977  . INFLUENZA VACCINE  01/22/2018  . MAMMOGRAM  08/19/2018  . COLONOSCOPY  03/24/2020  . TETANUS/TDAP  09/30/2021  . Hepatitis C Screening  Completed   Immunization History  Administered Date(s) Administered  . Tdap 10/01/2011   We updated and reviewed the patient's past history in detail and it is documented below. Allergies: Patient is allergic to penicillins; latex; and adhesive [tape]. Past Medical History Patient  has a past medical history of Antiphospholipid antibody positive (03/29/2016), Autoimmune disease ([place]), Fibroid, Hand pain, and migraines. Past Surgical History Patient  has a past surgical history that includes Breast surgery; Abdominal hysterectomy; and Pelvic laparoscopy. Family History: Patient family history includes Cervical cancer in her maternal grandmother; Clotting disorder in her mother; Healthy in her daughter; Macular  degeneration in her mother; Prostate cancer in her father. Social History:  Patient  reports that she has never smoked. She has never used smokeless tobacco. She reports that she drinks about 0.6 - 1.2 oz of alcohol per week. She reports that she does not use drugs.  Review of Systems: Constitutional: negative for  fever or malaise Ophthalmic: negative for photophobia, double vision or loss of vision Cardiovascular: negative for chest pain, dyspnea on exertion, or new LE swelling Respiratory: negative for SOB or persistent cough Gastrointestinal: negative for abdominal pain, change in bowel habits or melena Genitourinary: negative for dysuria or gross hematuria, no abnormal uterine bleeding or disharge Musculoskeletal: negative for new gait disturbance or muscular weakness Integumentary: negative for new or persistent rashes, no breast lumps Neurological: negative for TIA or stroke symptoms Psychiatric: negative for SI or delusions Allergic/Immunologic: negative for hives  Patient Care Team    Relationship Specialty Notifications Start End  Leamon Arnt, MD PCP - General Family Medicine  11/14/17   [name], MD Consulting Physician Gynecology  11/14/17   Irene Shipper, MD Consulting Physician Gastroenterology  11/14/17   Bo Merino, MD Consulting Physician Rheumatology  11/14/17     Objective  Vitals: BP 104/70   Pulse 60   Temp 97.7 F (36.5 C)   Ht 5' 6.25" (1.683 m)   Wt 136 lb (61.7 kg)   LMP 10/11/2012   SpO2 98%   BMI 21.79 kg/m  General:  Well developed, well nourished, no acute distress  Psych:  Alert and orientedx3,normal mood and affect HEENT:  Normocephalic, atraumatic, non-icteric sclera, PERRL, oropharynx is clear without mass or exudate, supple neck without adenopathy, mass or thyromegaly Cardiovascular:  Normal S1, S2, RRR without gallop, rub or murmur, nondisplaced PMI, trace pitting edema bilateral lower ext Respiratory:  Good breath sounds bilaterally, CTAB with normal respiratory effort Gastrointestinal: normal bowel sounds, soft, non-tender, no noted masses. No HSM MSK: no deformities, contusions. Joints are without erythema or swelling. Spine and CVA region are nontender Skin:  Warm, no rashes or suspicious lesions noted, hyperpigmentation on left  subscapular area Neurologic:    Mental status is normal. CN 2-11 are normal. Gross motor and sensory exams are normal. Normal gait. No tremor Breast Exam: nl gyn office 07/2017, not repeated   Commons side effects, risks, benefits, and alternatives for medications and treatment plan prescribed today were discussed, and the patient expressed understanding of the given instructions. Patient is instructed to call or message via MyChart if he/she has any questions or concerns regarding our treatment plan. No barriers to understanding were identified. We discussed Red Flag symptoms and signs in detail. Patient expressed understanding regarding what to do in case of urgent or emergency type symptoms.   Medication list was reconciled, printed and provided to the patient in AVS. Patient instructions and summary information was reviewed with the patient as documented in the AVS. This note was prepared with assistance of Dragon voice recognition software. Occasional wrong-word or sound-a-like substitutions may have occurred due to the inherent limitations of voice recognition software

## 2018-01-06 NOTE — Progress Notes (Signed)
Please add on t3, and free t4, dx: abnormal TSH Thanks, Dr. Jonni Sanger '

## 2018-01-06 NOTE — Patient Instructions (Signed)
Please return in 12 months for your annual complete physical; please come fasting.  I will release your lab results to you on your MyChart account with further instructions. Please reply with any questions.    If you have any questions or concerns, please don't hesitate to send me a message via MyChart or call the office at [phone]. Thank you for visiting with us today! It's our pleasure caring for you.  Please do these things to maintain good health!   Exercise at least 30-45 minutes a day,  4-5 days a week.   Eat a low-fat diet with lots of fruits and vegetables, up to 7-9 servings per day.  Drink plenty of water daily. Try to drink 8 8oz glasses per day.  Seatbelts can save your life. Always wear your seatbelt.  Place Smoke Detectors on every level of your home and check batteries every year.  Schedule an appointment with an eye doctor for an eye exam every 1-2 years  Safe sex - use condoms to protect yourself from STDs if you could be exposed to these types of infections. Use birth control if you do not want to become pregnant and are sexually active.  Avoid heavy alcohol use. If you drink, keep it to less than 2 drinks/day and not every day.  Health Care Power of Attorney.  Choose someone you trust that could speak for you if you became unable to speak for yourself.  Depression is common in our stressful world.If you're feeling down or losing interest in things you normally enjoy, please come in for a visit.  If anyone is threatening or hurting you, please get help. Physical or Emotional Violence is never OK.   

## 2018-01-07 ENCOUNTER — Other Ambulatory Visit: Payer: Self-pay | Admitting: Emergency Medicine

## 2018-01-07 DIAGNOSIS — Z79890 Hormone replacement therapy (postmenopausal): Secondary | ICD-10-CM

## 2018-01-07 LAB — HIV ANTIBODY (ROUTINE TESTING W REFLEX): HIV 1&2 Ab, 4th Generation: NONREACTIVE

## 2018-01-12 ENCOUNTER — Encounter: Payer: Self-pay | Admitting: Family Medicine

## 2018-01-12 MED ORDER — HYDROXYCHLOROQUINE SULFATE 200 MG PO TABS: ORAL_TABLET | ORAL | 0 refills | Status: DC

## 2018-01-12 MED ORDER — ESTROGENS CONJUGATED 0.625 MG PO TABS: 0.6250 mg | ORAL_TABLET | Freq: Every day | ORAL | 0 refills | Status: DC

## 2018-01-12 NOTE — Telephone Encounter (Signed)
Ok to send in medication?   Doloris Hall,  LPN

## 2018-01-12 NOTE — Telephone Encounter (Signed)
Please see her note and send in RX as requested to pharmacy in [place]

## 2018-01-23 NOTE — Progress Notes (Signed)
Office Visit Note  Patient: Alexandra Garner             Date of Birth: 31-May-1961           MRN: [id number]             PCP: Leamon Arnt, MD Referring: No ref. provider found Visit Date: 02/06/2018 Occupation: @GUAROCC @  Subjective:  Mid-thoracic back pain   History of Present Illness: Alexandra Garner is a 57 y.o. female with history of autoimmune disease and osteoarthritis.  Patient is on PLQ 200 mg 1 tablet daily.  She denies any recent autoimmune flares.  She denies any joint pain or joint swelling at this time.  She denies any sores in mouth or nose.  She denies any facial rashes or photosensitivity.  She denies any swollen lymph nodes.  She denies any shortness of breath but occasionally has palpitations.  She reports has not had a EKG as recommended in the past. She has no symptoms of Raynaud's in the summer.  She continues to take Aspirin 162 mg daily. She denies any sicca symptoms.   She reports that when she was here for her last visit she was having skin dryness and cracking of the right thumb, which has resolved but is now occurring on the left thumb.  She has been applying aquaphor and gauze daily.  She was evaluated by PCP who prescribed triamcinolone but she felt it made the skin breakdown worse.  She reports that 2-3 months ago she developed pain in the  mid-thoracic region on the right side.  She states daily it starts itching and then progresses to a throbbing pain.  She denies any rashes in this region.  She denies any history of shingles.  She states the region has diminished sensation.  She takes Advil PRN which provides pain relief.  She states she has been experiencing mild symptoms of carpal tunnel on the left side.  She denies any overuse activities or symptoms at night.    Activities of Daily Living:  Patient reports morning stiffness for 45 minutes.   Patient Reports nocturnal pain.  Difficulty dressing/grooming: Denies Difficulty climbing stairs: Denies Difficulty  getting out of chair: Denies Difficulty using hands for taps, buttons, cutlery, and/or writing: Reports  Review of Systems  Constitutional: Negative for fatigue.  HENT: Negative for mouth sores, mouth dryness and nose dryness.   Eyes: Negative for pain, visual disturbance and dryness.  Respiratory: Negative for cough, hemoptysis, shortness of breath and difficulty breathing.   Cardiovascular: Positive for swelling in legs/feet. Negative for chest pain, palpitations and hypertension.  Gastrointestinal: Negative for blood in stool, constipation and diarrhea.  Endocrine: Negative for increased urination.  Genitourinary: Negative for difficulty urinating and painful urination.  Musculoskeletal: Positive for arthralgias, joint pain and morning stiffness. Negative for joint swelling, myalgias, muscle weakness, muscle tenderness and myalgias.  Skin: Negative for color change, pallor, rash, hair loss, nodules/bumps, skin tightness, ulcers and sensitivity to sunlight.  Allergic/Immunologic: Negative for susceptible to infections.  Neurological: Negative for dizziness, numbness, headaches and weakness.  Hematological: Negative for bruising/bleeding tendency and swollen glands.  Psychiatric/Behavioral: Negative for depressed mood and sleep disturbance. The patient is not nervous/anxious.     PMFS History:  Patient Active Problem List   Diagnosis Date Noted  . Dyshidrotic eczema 11/14/2017  . Antiphospholipid syndrome ([place]) 11/14/2017  . 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 Hormone replacement therapy (HRT) 11/14/2017  . History of migraine 11/07/2017  . Autoimmune disease ([place])  10/20/2016  . High risk medication use 09/26/2016  . Raynaud's disease without gangrene 09/19/2016  . Primary osteoarthritis of both feet 09/19/2016  . Family history of macular degeneration 02/03/2015  . Primary localized osteoarthrosis of hand 12/23/2013  . History of colonic polyps 11/01/2008    Past Medical  History:  Diagnosis Date  . 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  . 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 Fibroid   . Hand pain    left hand-had 2 cortisone injections  . Hx of migraines     Family History  Problem Relation Age of Onset  . Clotting disorder Mother   . Macular degeneration Mother   . Prostate cancer Father   . Cervical cancer Maternal Grandmother   . Healthy Daughter    Past Surgical History:  Procedure Laterality Date  . ABDOMINAL HYSTERECTOMY    . BREAST SURGERY    . PELVIC LAPAROSCOPY     Social History   Social History Narrative  . Not on file    Objective: Vital Signs: BP 95/65 (BP Location: Left Arm, Patient Position: Sitting, Cuff Size: Normal)   Pulse 63   Resp 14   Ht 5\' 6"  (1.676 m)   Wt 136 lb (61.7 kg)   LMP 10/11/2012   BMI 21.95 kg/m    Physical Exam  Constitutional: She is oriented to person, place, and time. She appears well-developed and well-nourished.  HENT:  Head: Normocephalic and atraumatic.  No oral or nasal ulcerations.  No parotid swelling noted.   Eyes: Conjunctivae and EOM are normal.  Neck: Normal range of motion.  Cardiovascular: Normal rate, regular rhythm, normal heart sounds and intact distal pulses.  Pulmonary/Chest: Effort normal and breath sounds normal.  Abdominal: Soft. Bowel sounds are normal.  Lymphadenopathy:    She has no cervical adenopathy.  Neurological: She is alert and oriented to person, place, and time.  Skin: Skin is warm and dry. Capillary refill takes less than 2 seconds.  Skin breakdown, cracking, and erythema noted on palmar aspect of the left thumb.   Psychiatric: She has a normal mood and affect. Her behavior is normal.  Nursing note and vitals reviewed.    Musculoskeletal Exam: C-spine, thoracic spine, lumbar spine good range of motion.  No midline spinal tenderness.  No SI joint tenderness.  She has diminished sensation in the right mid thoracic region.  Dry skin  was noted.  Shoulder joints, elbow joints, wrist joints, MCPs and PIPs, DIPs good range of motion with no synovitis.  She is complete fist formation bilaterally.  She has mild PIP and DIP synovial thickening consistent with osteoarthritis of bilateral hands.  Hip joints, knee joints, ankle joints, MCPs, PIPs, DIPs good range of motion with no synovitis.  No warmth or effusion of bilateral knee joints.  No knee crepitus.  No tenderness swelling of ankle joints.  No tenderness of trochanteric bursa bilaterally.  CDAI Exam: No CDAI exam completed.   Investigation: No additional findings.  Imaging: No results found.  Recent Labs: Lab Results  Component Value Date   WBC 4.9 01/06/2018   HGB 12.5 01/06/2018   PLT 234.0 01/06/2018   NA 140 01/06/2018   K 4.1 01/06/2018   CL 105 01/06/2018   CO2 29 01/06/2018   GLUCOSE 95 01/06/2018   BUN 15 01/06/2018   CREATININE 0.82 01/06/2018   BILITOT 0.5 01/06/2018   ALKPHOS 34 (L) 01/06/2018   AST 19 01/06/2018   ALT 18 01/06/2018  PROT 6.4 01/06/2018   ALBUMIN 4.2 01/06/2018   CALCIUM 9.3 01/06/2018   GFRAA 99 12/29/2017    Speciality Comments: No specialty comments available.  Procedures:  No procedures performed Allergies: Penicillins; Latex; and Adhesive [tape]   Assessment / Plan:     Visit Diagnoses: Autoimmune disease ([place]) - History of arthralgias, [name], photosensitivity, positive aCL IgM46, positive beta 2 100: She has not had any recent signs or symptoms of an autoimmune flare.  She has no synovitis or joint pain on exam.  She has no oral or nasal ulcerations noted.  No malar rash or photosensitivity at this time.  She continues to have occasional palpitations, and she was advised to follow up with PCP for a EKG.  She denies any shortness of breath.  She does not have any sicca symptoms.  No parotid swelling noted.  She has very mild symptoms of Raynaud's in the summer.  No signs of gangrene noted.  She continues to have skin  breakdown, cracking, and erythema of the left thumb.  She has been applying aquaphor and gauze. She was advised to follow up with dermatology.  Autoimmune labs will be checked today.  plan: Urinalysis, Routine w reflex microscopic, C3 and C4, Anti-DNA antibody, double-stranded, Sedimentation rate  High risk medication use - PLQ 200 mg 1 tablet daily.  CBC and CMP were drawn on 01/06/2018.  A refill of Plaquenil was sent to the pharmacy today.  Raynaud's disease without gangrene: She has very mild symptoms of [place]olds during the summer.  No signs of gangrene were noted.  He has skin breakdown and erythema of the left thumb.  She is advised to follow-up with dermatology.  She has been applying Aquaphor and gauze wrap on a daily basis.  Antiphospholipid antibody positive -She continues to take ASA 162 mg po daily.  Vitamin D deficiency: Vitamin D was 41 on 12/29/2017.  Primary osteoarthritis of both hands: She has mild PIP and DIP synovial thickening consistent with osteoarthritis of bilateral hands.  Joint protection and muscle strengthening were discussed.  Primary osteoarthritis of both feet: She has mild osteoarthritic changes noted in bilateral feet.  She has no discomfort at this time.  Other medical conditions are listed as follows:  Other fatigue  Positive cardiolipin antibodies, positive b2 GP1  History of migraine  History of colonic polyps   Orders: Orders Placed This Encounter  Procedures  . Urinalysis, Routine w reflex microscopic  . C3 and C4  . Anti-DNA antibody, double-stranded  . Sedimentation rate   Meds ordered this encounter  Medications  . hydroxychloroquine (PLAQUENIL) 200 MG tablet    Sig: Take 1 tablet by mouth daily    Dispense:  90 tablet    Refill:  0    Face-to-face time spent with patient was 30 minutes. Greater than 50% of time was spent in counseling and coordination of care.  Follow-Up Instructions: Return in about 6 months (around 08/09/2018) for  Autoimmune Disease, Osteoarthritis.   Ofilia Neas, PA-C  Note - This record has been created using Dragon software.  Chart creation errors have been sought, but may not always  have been located. Such creation errors do not reflect on  the standard of medical care.

## 2018-02-04 ENCOUNTER — Other Ambulatory Visit (INDEPENDENT_AMBULATORY_CARE_PROVIDER_SITE_OTHER): Payer: 59

## 2018-02-04 DIAGNOSIS — Z79890 Hormone replacement therapy (postmenopausal): Secondary | ICD-10-CM

## 2018-02-04 LAB — T3, FREE: T3, Free: 3.8 pg/mL (ref 2.3–4.2)

## 2018-02-04 LAB — TSH: TSH: 3.24 u[IU]/mL (ref 0.35–4.50)

## 2018-02-04 LAB — T4, FREE: FREE T4: 0.89 ng/dL (ref 0.60–1.60)

## 2018-02-06 ENCOUNTER — Ambulatory Visit: Payer: 59 | Admitting: Physician Assistant

## 2018-02-06 ENCOUNTER — Encounter: Payer: Self-pay | Admitting: Physician Assistant

## 2018-02-06 VITALS — BP 95/65 | HR 63 | Resp 14 | Ht 66.0 in | Wt 136.0 lb

## 2018-02-06 DIAGNOSIS — I7300 Raynaud's syndrome without gangrene: Secondary | ICD-10-CM

## 2018-02-06 DIAGNOSIS — M359 Systemic involvement of connective tissue, unspecified: Secondary | ICD-10-CM

## 2018-02-06 DIAGNOSIS — R760 Raised antibody titer: Secondary | ICD-10-CM

## 2018-02-06 DIAGNOSIS — D8989 Other specified disorders involving the immune mechanism, not elsewhere classified: Secondary | ICD-10-CM | POA: Diagnosis not present

## 2018-02-06 DIAGNOSIS — Z860100 Personal history of colon polyps, unspecified: Secondary | ICD-10-CM

## 2018-02-06 DIAGNOSIS — M19072 Primary osteoarthritis, left ankle and foot: Secondary | ICD-10-CM

## 2018-02-06 DIAGNOSIS — Z79899 Other long term (current) drug therapy: Secondary | ICD-10-CM | POA: Diagnosis not present

## 2018-02-06 DIAGNOSIS — M19041 Primary osteoarthritis, right hand: Secondary | ICD-10-CM

## 2018-02-06 DIAGNOSIS — Z8669 Personal history of other diseases of the nervous system and sense organs: Secondary | ICD-10-CM

## 2018-02-06 DIAGNOSIS — E559 Vitamin D deficiency, unspecified: Secondary | ICD-10-CM

## 2018-02-06 DIAGNOSIS — M19042 Primary osteoarthritis, left hand: Secondary | ICD-10-CM

## 2018-02-06 DIAGNOSIS — M19071 Primary osteoarthritis, right ankle and foot: Secondary | ICD-10-CM

## 2018-02-06 DIAGNOSIS — R5383 Other fatigue: Secondary | ICD-10-CM

## 2018-02-06 MED ORDER — HYDROXYCHLOROQUINE SULFATE 200 MG PO TABS: ORAL_TABLET | ORAL | 0 refills | Status: DC

## 2018-02-09 LAB — C3 AND C4
C3 COMPLEMENT: 90 mg/dL (ref 83–193)
C4 COMPLEMENT: 14 mg/dL — AB (ref 15–57)

## 2018-02-09 LAB — URINALYSIS, ROUTINE W REFLEX MICROSCOPIC
Bilirubin Urine: NEGATIVE
Glucose, UA: NEGATIVE
Hgb urine dipstick: NEGATIVE
Ketones, ur: NEGATIVE
Leukocytes, UA: NEGATIVE
Nitrite: NEGATIVE
Protein, ur: NEGATIVE
Specific Gravity, Urine: 1.029 (ref 1.001–1.03)
pH: <=5 (ref 5.0–8.0)

## 2018-02-09 LAB — SEDIMENTATION RATE: Sed Rate: 6 mm/h (ref 0–30)

## 2018-02-09 LAB — ANTI-DNA ANTIBODY, DOUBLE-STRANDED: ds DNA Ab: <1 IU/mL

## 2018-02-09 NOTE — Progress Notes (Signed)
C4 stable. Sed rate WNL. Dsdna negative.  UA WNL.

## 2018-02-25 DIAGNOSIS — L308 Other specified dermatitis: Secondary | ICD-10-CM | POA: Diagnosis not present

## 2018-07-27 NOTE — Progress Notes (Signed)
Office Visit Note  Patient: Alexandra Garner             Date of Birth: 1961/02/21           MRN: [id number]             PCP: Leamon Arnt, MD Referring: Leamon Arnt, MD Visit Date: 08/10/2018 Occupation: _0 @  Subjective:  Pain in multiple joints    History of Present Illness: Alexandra Garner is a 58 y.o. female with history of autoimmune disease.  She is taking PLQ 200 mg 1 tablet po daily. She continues to take aspirin 162 mg po daily. She is having increased pain in both hands, both knees, and both feet.  She denies any joint swelling. She denies any change in activity. She is having bilateral trochanteric bursitis. She has been experiencing pain at night. She continues to have chronic fatigue.  She has noticed some hair thinning.  She reports the rash/dryness on both thumbs and heels has improved.  She uses steroid cream topically PRN. Her Raynaud's has well controlled with milder weather.  She denies any digital ulcerations.   Activities of Daily Living:  Patient reports morning stiffness for several hours.   Patient Reports nocturnal pain.  Difficulty dressing/grooming: Denies Difficulty climbing stairs: Reports Difficulty getting out of chair: Reports Difficulty using hands for taps, buttons, cutlery, and/or writing: Reports  Review of Systems  Constitutional: Positive for fatigue.  HENT: Negative for mouth sores, mouth dryness and nose dryness.   Eyes: Negative for pain, itching, visual disturbance and dryness.  Respiratory: Negative for cough, hemoptysis, shortness of breath, wheezing and difficulty breathing.   Cardiovascular: Negative for chest pain, palpitations, hypertension and swelling in legs/feet.  Gastrointestinal: Negative for abdominal pain, blood in stool, constipation and diarrhea.  Endocrine: Negative for increased urination.  Genitourinary: Negative for painful urination and nocturia.  Musculoskeletal: Positive for arthralgias, joint pain and morning  stiffness. Negative for joint swelling, myalgias, muscle weakness, muscle tenderness and myalgias.  Skin: Positive for redness. Negative for color change, pallor, rash, hair loss, nodules/bumps, skin tightness, ulcers and sensitivity to sunlight.  Allergic/Immunologic: Negative for susceptible to infections.  Neurological: Negative for dizziness, light-headedness, numbness, headaches and memory loss.  Hematological: Negative for swollen glands.  Psychiatric/Behavioral: Negative for depressed mood, confusion and sleep disturbance. The patient is not nervous/anxious.     PMFS History:  Patient Active Problem List   Diagnosis Date Noted  . Dyshidrotic eczema 11/14/2017  . Antiphospholipid syndrome ([place]) 11/14/2017  . 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 Hormone replacement therapy (HRT) 11/14/2017  . History of migraine 11/07/2017  . Autoimmune disease ([place]) 10/20/2016  . High risk medication use 09/26/2016  . Raynaud's disease without gangrene 09/19/2016  . Primary osteoarthritis of both feet 09/19/2016  . Family history of macular degeneration 02/03/2015  . Primary localized osteoarthrosis of hand 12/23/2013  . History of colonic polyps 11/01/2008    Past Medical History:  Diagnosis Date  . 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  . 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 Fibroid   . Hand pain    left hand-had 2 cortisone injections  . Hx of migraines     Family History  Problem Relation Age of Onset  . Clotting disorder Mother   . Macular degeneration Mother   . Prostate cancer Father   . Cervical cancer Maternal Grandmother   . Healthy Daughter    Past Surgical History:  Procedure Laterality Date  .  ABDOMINAL HYSTERECTOMY    . BREAST SURGERY    . PELVIC LAPAROSCOPY     Social History   Social History Narrative  . Not on file   Immunization History  Administered Date(s) Administered  . Tdap 10/01/2011     Objective: Vital Signs: BP 109/70 (BP  Location: Left Arm, Patient Position: Sitting, Cuff Size: Normal)   Pulse 63   Resp 13   Ht _0  (1.676 m)   Wt 140 lb 3.2 oz (63.6 kg)   LMP 10/11/2012   BMI 22.63 kg/m    Physical Exam Vitals signs and nursing note reviewed.  Constitutional:      Appearance: She is well-developed.  HENT:     Head: Normocephalic and atraumatic.     Comments: No parotid swelling.    Nose:     Comments: No nasal ulcerations    Mouth/Throat:     Comments: No oral ulcerations Eyes:     Conjunctiva/sclera: Conjunctivae normal.  Neck:     Musculoskeletal: Normal range of motion.  Cardiovascular:     Rate and Rhythm: Normal rate and regular rhythm.     Heart sounds: Normal heart sounds.  Pulmonary:     Effort: Pulmonary effort is normal.     Breath sounds: Normal breath sounds.  Abdominal:     General: Bowel sounds are normal.     Palpations: Abdomen is soft.  Lymphadenopathy:     Cervical: No cervical adenopathy.  Skin:    General: Skin is warm and dry.     Capillary Refill: Capillary refill takes less than 2 seconds.     Comments: No digital ulcerations or signs of gangrene.  Erythema and dryness present on palmar aspect of both thumbs.  No malar rash noted.  Neurological:     Mental Status: She is alert and oriented to person, place, and time.  Psychiatric:        Behavior: Behavior normal.      Musculoskeletal Exam: C-spine, thoracic spine, and lumbar spine good ROM.  No midline spinal tenderness.  No SI joint tenderness.  Shoulder joints, elbow joints, MCPs, PIPs, and DIPs good ROM with no synovitis.  Complete fist formation bilaterally.  Hip joints, knee joints, ankle joints, MTPs, PIPs, and DIPs good ROM with no synovitis.  No warmth or effusion of knee joints.  No tenderness or swelling of ankle joints.  Tenderness over trochanteric bursa bilaterally.   CDAI Exam: CDAI Score: Not documented Patient Global Assessment: Not documented; Provider Global Assessment: Not  documented Swollen: Not documented; Tender: Not documented Joint Exam   Not documented   There is currently no information documented on the homunculus. Go to the Rheumatology activity and complete the homunculus joint exam.  Investigation: No additional findings.  Imaging: Xr Hips Bilat W Or W/o Pelvis 3-4 Views  Result Date: 08/10/2018 No hip joint narrowing was noted.  No SI joint changes were noted. Impression: Unremarkable x-ray of bilateral hip joints.  Xr Knee 3 View Left  Result Date: 08/10/2018 Mild medial compartment narrowing and mild patellofemoral narrowing was noted.  No chondrocalcinosis was noted. Impression: These findings are consistent with mild osteoarthritis and mild chondromalacia patella.  Xr Knee 3 View Right  Result Date: 08/10/2018 Moderate medial compartment narrowing and mild patellofemoral narrowing was noted.  No chondrocalcinosis was noted. Impression: These findings are consistent with moderate osteoarthritis and mild chondromalacia patella.   Recent Labs: Lab Results  Component Value Date   WBC 4.9 01/06/2018   HGB 12.5  01/06/2018   PLT 234.0 01/06/2018   NA 140 01/06/2018   K 4.1 01/06/2018   CL 105 01/06/2018   CO2 29 01/06/2018   GLUCOSE 95 01/06/2018   BUN 15 01/06/2018   CREATININE 0.82 01/06/2018   BILITOT 0.5 01/06/2018   ALKPHOS 34 (L) 01/06/2018   AST 19 01/06/2018   ALT 18 01/06/2018   PROT 6.4 01/06/2018   ALBUMIN 4.2 01/06/2018   CALCIUM 9.3 01/06/2018   GFRAA 99 12/29/2017    Speciality Comments: Plaquenil eye exam: normal on 12/09/16 per [facility]  Procedures:  No procedures performed Allergies: Penicillins; Latex; and Adhesive [tape]     Assessment / Plan:     Visit Diagnoses: Autoimmune disease ([place]) - History of arthralgias, [name], photosensitivity, positive aCL IgM46, positive beta 2 100: She has not had any recent signs or symptoms of a flare.  She is clinically doing well on Plaquenil 200 mg  1 tablet po daily and Aspirin 162 mg po daily.  She has no synovitis on exam.  She has been having increased pain in both hands, both knee joints, and both feet.  She has very intermittent and mild symptoms of Raynaud's and no digital ulcerations.  Good capillary refill.  She has no sicca symptoms and no parotid swelling on exam.  She has no oral or nasal ulcerations.  No cervical lymphadenopathy or recent fevers.  She has chronic fatigue.  She continues to have intermittent palpitations.  She was advised to return to PCP for a repeat EKG.  She continues to have photosensitivity.  No malar rash noted on exam.  She has intermittent erythema and dryness on both thumbs, which she applies steroid cream to topically PRN.  We will check autoimmune labs today.  She will continue taking PLQ 200 mg 1 tablet po daily and ASA 162 mg po daily.  She was advised to notify us if she develops any new or worsening symptoms.  She will follow up in 5 months.- Plan: Urinalysis, Routine w reflex microscopic, Anti-DNA antibody, double-stranded, C3 and C4, Sedimentation rate  High risk medication use - PLQ 200 mg 1 tablet daily. Last Plaquenil eye exam on file from 12/09/2016. She was given a PLQ eye exam form today. Most recent CBC/CMP within normal limits except slightly low alk phos on 01/06/2018.  Due for CBC/CMP today and will monitor every 5 months.  Standing orders are in place.  - Plan: CBC with Differential/Platelet, COMPLETE METABOLIC PANEL WITH GFR  Raynaud's disease without gangrene: She has intermittent mild symptoms of Raynaud's. No digital ulcerations or signs of gangrene noted.  She was encouraged to keep her core body temperature warm and to wear glove/socks on a regular basis.    Antiphospholipid antibody positive - She takes Aspirin 162 mg po daily.   Vitamin D deficiency  Primary osteoarthritis of both hands: She has complete fist formation bilaterally.  No tenderness or synovitis on exam.  Joint protection and  muscle strengthening were discussed.   Primary osteoarthritis of both feet: She has been having increased pain in both feet.  She has no synovitis or tenderness.  We discussed the importance of wearing proper fitting shoes.   Chronic pain of both knees -She has been having worsening pain in both knee joints.  No warmth or effusion noted.  She has good ROM bilaterally.  X-rays of both knee joints were obtained today.  She was given a handout of knee joint exercises.  Plan: XR KNEE 3 VIEW RIGHT, XR  KNEE 3 VIEW LEFT  Pain of both hip joints: She has good ROM on exam with no discomfort.  She has bilateral trochanteric bursitis.  She requested XR of both hip joints.   Trochanteric bursitis of both hips: She has tenderness over bilateral trochanteric bursa.  She declined cortisone injections today.  She declined PT.  She was given a handout of exercises to perform.  Exercises were demonstrated.   Other fatigue: Chronic  Positive cardiolipin antibodies, positive b2 GP1: She takes aspirin 162 mg po daily.    Other medical conditions are listed as follows:   History of migraine  History of colonic polyps    Orders: Orders Placed This Encounter  Procedures  . XR KNEE 3 VIEW RIGHT  . XR KNEE 3 VIEW LEFT  . XR HIPS BILAT W OR W/O PELVIS 3-4 VIEWS  . CBC with Differential/Platelet  . COMPLETE METABOLIC PANEL WITH GFR  . Urinalysis, Routine w reflex microscopic  . Anti-DNA antibody, double-stranded  . C3 and C4  . Sedimentation rate   No orders of the defined types were placed in this encounter.   Face-to-face time spent with patient was 30 minutes. Greater than 50% of time was spent in counseling and coordination of care.  Follow-Up Instructions: Return in about 5 months (around 01/08/2019) for Autoimmune Disease, Raynaud's syndrome.    Hazel Sams PA-C  I examined and evaluated the patient with Hazel Sams PA.  Patient continues to have some photosensitivity.  Her Raynolds is not as  active.  She has been taking aspirin.  On my examination today she has tenderness over bilateral trochanteric bursa.  IT band exercises were demonstrated in the office.  Handout was given today.  She was also having discomfort in her hip joints and knee joints.  The x-rays were discussed which were unremarkable except for mild to moderate osteoarthritis in the knee joints.  Muscle strengthening exercises were discussed.  The plan of care was discussed as noted above.  Bo Merino, MD  Note - This record has been created using Editor, commissioning.  Chart creation errors have been sought, but may not always  have been located. Such creation errors do not reflect on  the standard of medical care.

## 2018-08-10 ENCOUNTER — Ambulatory Visit (INDEPENDENT_AMBULATORY_CARE_PROVIDER_SITE_OTHER): Payer: Self-pay

## 2018-08-10 ENCOUNTER — Encounter: Payer: Self-pay | Admitting: Physician Assistant

## 2018-08-10 ENCOUNTER — Ambulatory Visit (INDEPENDENT_AMBULATORY_CARE_PROVIDER_SITE_OTHER): Payer: 59

## 2018-08-10 ENCOUNTER — Ambulatory Visit: Payer: 59 | Admitting: Rheumatology

## 2018-08-10 VITALS — BP 109/70 | HR 63 | Resp 13 | Ht 66.0 in | Wt 140.2 lb

## 2018-08-10 DIAGNOSIS — M7061 Trochanteric bursitis, right hip: Secondary | ICD-10-CM

## 2018-08-10 DIAGNOSIS — M25551 Pain in right hip: Secondary | ICD-10-CM | POA: Diagnosis not present

## 2018-08-10 DIAGNOSIS — M19072 Primary osteoarthritis, left ankle and foot: Secondary | ICD-10-CM

## 2018-08-10 DIAGNOSIS — M7062 Trochanteric bursitis, left hip: Secondary | ICD-10-CM

## 2018-08-10 DIAGNOSIS — G8929 Other chronic pain: Secondary | ICD-10-CM | POA: Diagnosis not present

## 2018-08-10 DIAGNOSIS — M19041 Primary osteoarthritis, right hand: Secondary | ICD-10-CM

## 2018-08-10 DIAGNOSIS — R760 Raised antibody titer: Secondary | ICD-10-CM

## 2018-08-10 DIAGNOSIS — M25561 Pain in right knee: Secondary | ICD-10-CM

## 2018-08-10 DIAGNOSIS — M25552 Pain in left hip: Secondary | ICD-10-CM | POA: Diagnosis not present

## 2018-08-10 DIAGNOSIS — I7300 Raynaud's syndrome without gangrene: Secondary | ICD-10-CM

## 2018-08-10 DIAGNOSIS — M19042 Primary osteoarthritis, left hand: Secondary | ICD-10-CM

## 2018-08-10 DIAGNOSIS — E559 Vitamin D deficiency, unspecified: Secondary | ICD-10-CM

## 2018-08-10 DIAGNOSIS — R5383 Other fatigue: Secondary | ICD-10-CM

## 2018-08-10 DIAGNOSIS — Z860100 Personal history of colon polyps, unspecified: Secondary | ICD-10-CM

## 2018-08-10 DIAGNOSIS — M25562 Pain in left knee: Secondary | ICD-10-CM

## 2018-08-10 DIAGNOSIS — M359 Systemic involvement of connective tissue, unspecified: Secondary | ICD-10-CM

## 2018-08-10 DIAGNOSIS — Z79899 Other long term (current) drug therapy: Secondary | ICD-10-CM

## 2018-08-10 DIAGNOSIS — Z8669 Personal history of other diseases of the nervous system and sense organs: Secondary | ICD-10-CM

## 2018-08-10 DIAGNOSIS — M19071 Primary osteoarthritis, right ankle and foot: Secondary | ICD-10-CM

## 2018-08-10 NOTE — Patient Instructions (Signed)
Iliotibial Band Syndrome Rehab Ask your health care provider which exercises are safe for you. Do exercises exactly as told by your health care provider and adjust them as directed. It is normal to feel mild stretching, pulling, tightness, or discomfort as you do these exercises, but you should stop right away if you feel sudden pain or your pain gets worse.Do not begin these exercises until told by your health care provider. Stretching and range of motion exercises These exercises warm up your muscles and joints and improve the movement and flexibility of your hip and pelvis. Exercise A: Quadriceps, prone  1. Lie on your abdomen on a firm surface, such as a bed or padded floor. 2. Bend your left / right knee and hold your ankle. If you cannot reach your ankle or pant leg, loop a belt around your foot and grab the belt instead. 3. Gently pull your heel toward your buttocks. Your knee should not slide out to the side. You should feel a stretch in the front of your thigh and knee. 4. Hold this position for __________ seconds. Repeat __________ times. Complete this stretch __________ times a day. Exercise B: Iliotibial band  1. Lie on your side with your left / right leg in the top position. 2. Bend both of your knees and grab your left / right ankle. Stretch out your bottom arm to help you balance. 3. Slowly bring your top knee back so your thigh goes behind your trunk. 4. Slowly lower your top leg toward the floor until you feel a gentle stretch on the outside of your left / right hip and thigh. If you do not feel a stretch and your knee will not fall farther, place the heel of your other foot on top of your knee and pull your knee down toward the floor with your foot. 5. Hold this position for __________ seconds. Repeat __________ times. Complete this stretch __________ times a day. Strengthening exercises These exercises build strength and endurance in your hip and pelvis. Endurance is the  ability to use your muscles for a long time, even after they get tired. Exercise C: Straight leg raises (hip abductors)  1. Lie on your side with your left / right leg in the top position. Lie so your head, shoulder, knee, and hip line up. You may bend your bottom knee to help you balance. 2. Roll your hips slightly forward so your hips are stacked directly over each other and your left / right knee is facing forward. 3. Tense the muscles in your outer thigh and lift your top leg 4-6 inches (10-15 cm). 4. Hold this position for __________ seconds. 5. Slowly return to the starting position. Let your muscles relax completely before doing another repetition. Repeat __________ times. Complete this exercise __________ times a day. Exercise D: Straight leg raises (hip extensors) 1. Lie on your abdomen on your bed or a firm surface. You can put a pillow under your hips if that is more comfortable. 2. Bend your left / right knee so your foot is straight up in the air. 3. Squeeze your buttock muscles and lift your left / right thigh off the bed. Do not let your back arch. 4. Tense this muscle as hard as you can without increasing any knee pain. 5. Hold this position for __________ seconds. 6. Slowly lower your leg to the starting position and allow it to relax completely. Repeat __________ times. Complete this exercise __________ times a day. Exercise E: Hip hike  1. Stand sideways on a bottom step. Stand on your left / right leg with your other foot unsupported next to the step. You can hold onto the railing or wall if needed for balance. 2. Keep your knees straight and your torso square. Then, lift your left / right hip up toward the ceiling. 3. Slowly let your left / right hip lower toward the floor, past the starting position. Your foot should get closer to the floor. Do not lean or bend your knees. Repeat __________ times. Complete this exercise __________ times a day. This information is not  intended to replace advice given to you by your health care provider. Make sure you discuss any questions you have with your health care provider. Document Released: 06/10/2005 Document Revised: 02/13/2016 Document Reviewed: 05/12/2015 Elsevier Interactive Patient Education  2019 [employer] Trochanteric Bursitis Rehab Ask your health care provider which exercises are safe for you. Do exercises exactly as told by your health care provider and adjust them as directed. It is normal to feel mild stretching, pulling, tightness, or discomfort as you do these exercises, but you should stop right away if you feel sudden pain or your pain gets worse.Do not begin these exercises until told by your health care provider. Stretching exercises These exercises warm up your muscles and joints and improve the movement and flexibility of your hip. These exercises also help to relieve pain and stiffness. Exercise A: Iliotibial band stretch  5. Lie on your side with your left / right leg in the top position. 6. Bend your left / right knee and grab your ankle. 7. Slowly bring your knee back so your thigh is behind your body. 8. Slowly lower your knee toward the floor until you feel a gentle stretch on the outside of your left / right thigh. If you do not feel a stretch and your knee will not fall farther, place the heel of your other foot on top of your outer knee and pull your thigh down farther. 9. Hold this position for __________ seconds. 10. Slowly return to the starting position. Repeat __________ times. Complete this exercise __________ times a day. Strengthening exercises These exercises build strength and endurance in your hip and pelvis. Endurance is the ability to use your muscles for a long time, even after they get tired. Exercise B: Bridge (hip extensors)  6. Lie on your back on a firm surface with your knees bent and your feet flat on the floor. 7. Tighten your buttocks muscles and lift your  buttocks off the floor until your trunk is level with your thighs. You should feel the muscles working in your buttocks and the back of your thighs. If this exercise is too easy, try doing it with your arms crossed over your chest. 8. Hold this position for __________ seconds. 9. Slowly return to the starting position. 10. Let your muscles relax completely between repetitions. Repeat __________ times. Complete this exercise __________ times a day. Exercise C: Squats (knee extensors and  quadriceps) 1. Stand in front of a table, with your feet and knees pointing straight ahead. You may rest your hands on the table for balance but not for support. 2. Slowly bend your knees and lower your hips like you are going to sit in a chair. ? Keep your weight over your heels, not over your toes. ? Keep your lower legs upright so they are parallel with the table legs. ? Do not let your hips go lower than your knees. ?   Do not bend lower than told by your health care provider. ? If your hip pain increases, do not bend as low. 3. Hold this position for __________ seconds. 4. Slowly push with your legs to return to standing. Do not use your hands to pull yourself to standing. Repeat __________ times. Complete this exercise __________ times a day. Exercise D: Hip hike 7. Stand sideways on a bottom step. Stand on your left / right leg with your other foot unsupported next to the step. You can hold onto the railing or wall if needed for balance. 8. Keeping your knees straight and your torso square, lift your left / right hip up toward the ceiling. 9. Hold this position for __________ seconds. 10. Slowly let your left / right hip lower toward the floor, past the starting position. Your foot should get closer to the floor. Do not lean or bend your knees. Repeat __________ times. Complete this exercise __________ times a day. Exercise E: Single leg stand 4. Stand near a counter or door frame that you can hold onto  for balance as needed. It is helpful to stand in front of a mirror for this exercise so you can watch your hip. 5. Squeeze your left / right buttock muscles then lift up your other foot. Do not let your left / right hip push out to the side. 6. Hold this position for __________ seconds. Repeat __________ times. Complete this exercise __________ times a day. This information is not intended to replace advice given to you by your health care provider. Make sure you discuss any questions you have with your health care provider. Document Released: 07/18/2004 Document Revised: 02/15/2016 Document Reviewed: 05/26/2015 Elsevier Interactive Patient Education  2019 [employer]  

## 2018-08-11 LAB — C3 AND C4
C3 Complement: 82 mg/dL — ABNORMAL LOW (ref 83–193)
C4 Complement: 11 mg/dL — ABNORMAL LOW (ref 15–57)

## 2018-08-11 LAB — URINALYSIS, ROUTINE W REFLEX MICROSCOPIC
BILIRUBIN URINE: NEGATIVE
Bacteria, UA: NONE SEEN /HPF
GLUCOSE, UA: NEGATIVE
HGB URINE DIPSTICK: NEGATIVE
Hyaline Cast: NONE SEEN /LPF
NITRITE: NEGATIVE
Specific Gravity, Urine: 1.03 (ref 1.001–1.03)
WBC UA: NONE SEEN /HPF (ref 0–5)
pH: 5.5 (ref 5.0–8.0)

## 2018-08-11 LAB — ANTI-DNA ANTIBODY, DOUBLE-STRANDED: ds DNA Ab: 1 IU/mL

## 2018-08-11 LAB — COMPLETE METABOLIC PANEL WITH GFR
AG RATIO: 2 (calc) (ref 1.0–2.5)
ALBUMIN MSPROF: 4.5 g/dL (ref 3.6–5.1)
ALT: 16 U/L (ref 6–29)
AST: 20 U/L (ref 10–35)
Alkaline phosphatase (APISO): 38 U/L (ref 37–153)
BILIRUBIN TOTAL: 0.4 mg/dL (ref 0.2–1.2)
BUN: 11 mg/dL (ref 7–25)
CO2: 30 mmol/L (ref 20–32)
CREATININE: 0.77 mg/dL (ref 0.50–1.05)
Calcium: 9.3 mg/dL (ref 8.6–10.4)
Chloride: 106 mmol/L (ref 98–110)
GFR, EST AFRICAN AMERICAN: 100 mL/min/{1.73_m2} (ref >=60)
GFR, EST NON AFRICAN AMERICAN: 86 mL/min/{1.73_m2} (ref >=60)
GLOBULIN: 2.2 g/dL (ref 1.9–3.7)
Glucose, Bld: 69 mg/dL (ref 65–99)
Potassium: 4.2 mmol/L (ref 3.5–5.3)
Sodium: 140 mmol/L (ref 135–146)
Total Protein: 6.7 g/dL (ref 6.1–8.1)

## 2018-08-11 LAB — CBC WITH DIFFERENTIAL/PLATELET
Absolute Monocytes: 334 cells/uL (ref 200–950)
BASOS PCT: 0.7 %
Basophils Absolute: 31 cells/uL (ref 0–200)
EOS ABS: 101 {cells}/uL (ref 15–500)
EOS PCT: 2.3 %
HCT: 38.2 % (ref 35.0–45.0)
HEMOGLOBIN: 13.1 g/dL (ref 11.7–15.5)
Lymphs Abs: 1201 cells/uL (ref 850–3900)
MCH: 29.1 pg (ref 27.0–33.0)
MCHC: 34.3 g/dL (ref 32.0–36.0)
MCV: 84.9 fL (ref 80.0–100.0)
MONOS PCT: 7.6 %
MPV: 11.6 fL (ref 7.5–12.5)
NEUTROS ABS: 2732 {cells}/uL (ref 1500–7800)
Neutrophils Relative %: 62.1 %
Platelets: 244 10*3/uL (ref 140–400)
RBC: 4.5 10*6/uL (ref 3.80–5.10)
RDW: 12.6 % (ref 11.0–15.0)
Total Lymphocyte: 27.3 %
WBC: 4.4 10*3/uL (ref 3.8–10.8)

## 2018-08-11 LAB — SEDIMENTATION RATE: Sed Rate: 9 mm/h (ref 0–30)

## 2018-08-11 NOTE — Progress Notes (Signed)
Complements are low.  Please advise patient to increase dose of PLQ to 200 mg BID M-F only. Please advise pt to return in 1 month to recheck complements. Sed rate WNL. DsDNA 1. CBC and CMP WNL. UA revealed trace protein.  Please repeat UA and obtain urine protein creatinine ratio in 1 month.  Labs were reviewed with Dr. Estanislado Pandy.

## 2018-08-12 ENCOUNTER — Other Ambulatory Visit: Payer: Self-pay

## 2018-08-12 ENCOUNTER — Ambulatory Visit: Payer: 59 | Admitting: Physician Assistant

## 2018-08-12 DIAGNOSIS — M359 Systemic involvement of connective tissue, unspecified: Secondary | ICD-10-CM

## 2018-08-13 ENCOUNTER — Encounter: Payer: Self-pay | Admitting: Family Medicine

## 2018-08-13 ENCOUNTER — Other Ambulatory Visit (HOSPITAL_COMMUNITY)
Admission: RE | Admit: 2018-08-13 | Discharge: 2018-08-13 | Disposition: A | Discharge status: Home / Self Care | Payer: 59 | Source: Ambulatory Visit | Attending: Family Medicine | Admitting: Family Medicine

## 2018-08-13 ENCOUNTER — Ambulatory Visit: Payer: 59 | Admitting: Family Medicine

## 2018-08-13 ENCOUNTER — Other Ambulatory Visit: Payer: Self-pay

## 2018-08-13 VITALS — BP 106/72 | HR 62 | Temp 97.8°F | Resp 16 | Ht 62.25 in | Wt 141.2 lb

## 2018-08-13 DIAGNOSIS — B373 Candidiasis of vulva and vagina: Secondary | ICD-10-CM | POA: Diagnosis present

## 2018-08-13 DIAGNOSIS — B3731 Acute candidiasis of vulva and vagina: Secondary | ICD-10-CM

## 2018-08-13 MED ORDER — FLUCONAZOLE 150 MG PO TABS: ORAL_TABLET | ORAL | 0 refills | Status: DC

## 2018-08-13 NOTE — Progress Notes (Signed)
Subjective  CC:  Chief Complaint  Patient presents with  . Vaginal Discharge    Started 2 days ago.. No Odor and noi urinary burning.    HPI: Alexandra Garner is a 58 y.o. female who presents to the office today to address the problems listed above in the chief complaint.  Patient presents for evaluation of an abnormal vaginal discharge: she describes  whitedischarge with mild itching and vaginal soreness without vaginal sores/lesions or odor. Sexually transmitted infection risk: Very low risk of STD exposure. The patient denies history of sexually transmitted disease.. She declines serology testing and HIV testing today. She denies urinary sxs.  I reviewed the patients updated PMH, FH, and SocHx.    Patient Active Problem List   Diagnosis Date Noted  . Dyshidrotic eczema 11/14/2017  . Antiphospholipid syndrome ([place]) 11/14/2017  . 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 Hormone replacement therapy (HRT) 11/14/2017  . History of migraine 11/07/2017  . Autoimmune disease ([place]) 10/20/2016  . High risk medication use 09/26/2016  . Raynaud's disease without gangrene 09/19/2016  . Primary osteoarthritis of both feet 09/19/2016  . Family history of macular degeneration 02/03/2015  . Primary localized osteoarthrosis of hand 12/23/2013  . History of colonic polyps 11/01/2008   Current Meds  Medication Sig  . aspirin 81 MG tablet Take 162 mg by mouth daily.   Marland Kitchen estrogens, conjugated, (PREMARIN) 0.625 MG tablet Take 1 tablet (0.625 mg total) by mouth daily.  . hydroxychloroquine (PLAQUENIL) 200 MG tablet Take 1 tablet by mouth daily    Allergies: Patient is allergic to penicillins; latex; and adhesive [tape]. Family History: Patient family history includes Cervical cancer in her maternal grandmother; Clotting disorder in her mother; Healthy in her daughter; Macular degeneration in her mother; Prostate cancer in her father. Social History:  Patient  reports that she has never smoked. She has  never used smokeless tobacco. She reports current alcohol use of about 1.0 - 2.0 standard drinks of alcohol per week. She reports that she does not use drugs.  Review of Systems: Constitutional: Negative for fever malaise or anorexia Cardiovascular: negative for chest pain Respiratory: negative for SOB or persistent cough Gastrointestinal: negative for abdominal pain  Objective  Vitals: BP 106/72   Pulse 62   Temp 97.8 F (36.6 C) (Oral)   Resp 16   Ht 5' 2.25" (1.581 m)   Wt 141 lb 3.2 oz (64 kg)   LMP 10/11/2012   SpO2 98%   BMI 25.62 kg/m  General: no acute distress , A&Ox3 Assessment  1. Yeast vaginitis      Plan   Yeast vag:  Await swab and treat. Supportive care  Follow up: Return if symptoms worsen or fail to improve.    Commons side effects, risks, benefits, and alternatives for medications and treatment plan prescribed today were discussed, and the patient expressed understanding of the given instructions. Patient is instructed to call or message via MyChart if he/she has any questions or concerns regarding our treatment plan. No barriers to understanding were identified. We discussed Red Flag symptoms and signs in detail. Patient expressed understanding regarding what to do in case of urgent or emergency type symptoms.   Medication list was reconciled, printed and provided to the patient in AVS. Patient instructions and summary information was reviewed with the patient as documented in the AVS. This note was prepared with assistance of Dragon voice recognition software. Occasional wrong-word or sound-a-like substitutions may have occurred due to the inherent limitations of  voice recognition software  No orders of the defined types were placed in this encounter.  Meds ordered this encounter  Medications  . fluconazole (DIFLUCAN) 150 MG tablet    Sig: Take one tablet today; may repeat in 3 days if symptoms persist    Dispense:  2 tablet    Refill:  0

## 2018-08-13 NOTE — Patient Instructions (Signed)
Please return in Jule 2020 for your annual complete physical; please come fasting.   If you have any questions or concerns, please don't hesitate to send me a message via MyChart or call the office at [phone]. Thank you for visiting with Alexandra Garner today! It's our pleasure caring for you.   Vaginal Yeast infection, Adult  Vaginal yeast infection is a condition that causes vaginal discharge as well as soreness, swelling, and redness (inflammation) of the vagina. This is a common condition. Some women get this infection frequently. What are the causes? This condition is caused by a change in the normal balance of the yeast (candida) and bacteria that live in the vagina. This change causes an overgrowth of yeast, which causes the inflammation. What increases the risk? The condition is more likely to develop in women who:  Take antibiotic medicines.  Have diabetes.  Take birth control pills.  Are pregnant.  Douche often.  Have a weak body defense system (immune system).  Have been taking steroid medicines for a long time.  Frequently wear tight clothing. What are the signs or symptoms? Symptoms of this condition include:  White, thick, creamy vaginal discharge.  Swelling, itching, redness, and irritation of the vagina. The lips of the vagina (vulva) may be affected as well.  Pain or a burning feeling while urinating.  Pain during sex. How is this diagnosed? This condition is diagnosed based on:  Your medical history.  A physical exam.  A pelvic exam. Your health care provider will examine a sample of your vaginal discharge under a microscope. Your health care provider may send this sample for testing to confirm the diagnosis. How is this treated? This condition is treated with medicine. Medicines may be over-the-counter or prescription. You may be told to use one or more of the following:  Medicine that is taken by mouth (orally).  Medicine that is applied as a cream  (topically).  Medicine that is inserted directly into the vagina (suppository). Follow these instructions at home:  Lifestyle  Do not have sex until your health care provider approves. Tell your sex partner that you have a yeast infection. That person should go to his or her health care provider and ask if they should also be treated.  Do not wear tight clothes, such as pantyhose or tight pants.  Wear breathable cotton underwear. General instructions  Take or apply over-the-counter and prescription medicines only as told by your health care provider.  Eat more yogurt. This may help to keep your yeast infection from returning.  Do not use tampons until your health care provider approves.  Try taking a sitz bath to help with discomfort. This is a warm water bath that is taken while you are sitting down. The water should only come up to your hips and should cover your buttocks. Do this 3-4 times per day or as told by your health care provider.  Do not douche.  If you have diabetes, keep your blood sugar levels under control.  Keep all follow-up visits as told by your health care provider. This is important. Contact a health care provider if:  You have a fever.  Your symptoms go away and then return.  Your symptoms do not get better with treatment.  Your symptoms get worse.  You have new symptoms.  You develop blisters in or around your vagina.  You have blood coming from your vagina and it is not your menstrual period.  You develop pain in your abdomen. Summary  Vaginal yeast infection is a condition that causes discharge as well as soreness, swelling, and redness (inflammation) of the vagina.  This condition is treated with medicine. Medicines may be over-the-counter or prescription.  Take or apply over-the-counter and prescription medicines only as told by your health care provider.  Do not douche. Do not have sex or use tampons until your health care provider  approves.  Contact a health care provider if your symptoms do not get better with treatment or your symptoms go away and then return. This information is not intended to replace advice given to you by your health care provider. Make sure you discuss any questions you have with your health care provider. Document Released: 03/20/2005 Document Revised: 10/27/2017 Document Reviewed: 10/27/2017 Elsevier Interactive Patient Education  2019 [employer].

## 2018-08-14 LAB — CERVICOVAGINAL ANCILLARY ONLY
Bacterial vaginitis: NEGATIVE
CANDIDA VAGINITIS: POSITIVE — AB

## 2018-08-24 ENCOUNTER — Encounter: Payer: Self-pay | Admitting: Cardiology

## 2018-08-24 ENCOUNTER — Ambulatory Visit: Payer: 59 | Admitting: Cardiology

## 2018-08-24 VITALS — BP 117/72 | HR 68 | Ht 66.0 in | Wt 139.1 lb

## 2018-08-24 DIAGNOSIS — I499 Cardiac arrhythmia, unspecified: Secondary | ICD-10-CM | POA: Insufficient documentation

## 2018-08-24 DIAGNOSIS — I7300 Raynaud's syndrome without gangrene: Secondary | ICD-10-CM | POA: Diagnosis not present

## 2018-08-24 DIAGNOSIS — R002 Palpitations: Secondary | ICD-10-CM | POA: Insufficient documentation

## 2018-08-24 DIAGNOSIS — R0609 Other forms of dyspnea: Secondary | ICD-10-CM | POA: Diagnosis not present

## 2018-08-24 DIAGNOSIS — M329 Systemic lupus erythematosus, unspecified: Secondary | ICD-10-CM | POA: Diagnosis not present

## 2018-08-24 DIAGNOSIS — D6859 Other primary thrombophilia: Secondary | ICD-10-CM

## 2018-08-24 NOTE — Progress Notes (Signed)
Subjective:   @Patient  ID@: Alexandra Garner, female    DOB: 1960/12/12, 57 y.o.   MRN: [id number]  Patient referred by Billey Chang L for irregular heart beat and palpitations.  Chief Complaint  Patient presents with  . Irregular Heart Beat  . Palpitations    took advil pm body ache  . New Patient (Initial Visit)    Reports that she recently moved into a new house and over the last 2-3 months she wakes up in the middle of the night with vibratory sensation in her chest and skipped beats. Symptoms start every night. She also mentions that she has not been sleeping well. Started Advil PM approximately 6 months ago and did stop this medication and symptoms have improved. She does continue to notice occasional episodes of irregular heart beat.   Reports 2 episodes of dizziness or possible vertigo over the last 6 months. States she was unable to drive to the gym. Did not notice palpitations during this.   Has occasional hot flashes. She has completed menopause. She does exercise 4-5 days a week and over the last few months has decreased the intensity of her workouts due to joint pain. She does feel that she has decreased exercise capacity from having to cut back due to joint pain.  She is followed by Dr. Estanislado Pandy for lupus and is on Plaquenil. Joint pain is stable. She has [name] disorder and also has familial hypercoagulable state. No hypertension, hyperlipidemia, or thyroid disorders. No former tobacco use.       Past Medical History:  Diagnosis Date  . 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  . Autoimmune disease ([place])    in the Lupus family  . Fibroid   . Hand pain    left hand-had 2 cortisone injections  . Hx of migraines   . Irregular heart rate 08/24/2018    Past Surgical History:  Procedure Laterality Date  . ABDOMINAL HYSTERECTOMY    . BREAST SURGERY    . PELVIC LAPAROSCOPY      Family History  Problem Relation Age of Onset  . Clotting disorder Mother     . Prostate cancer Father   . Cervical cancer Maternal Grandmother   . Healthy Daughter     Social History   Socioeconomic History  . Marital status: Married    Spouse name: Not on file  . Number of children: 1  . Years of education: Not on file  . Highest education level: Not on file  Occupational History  . Not on file  Social Needs  . Financial resource strain: Not on file  . Food insecurity:    Worry: Not on file    Inability: Not on file  . Transportation needs:    Medical: Not on file    Non-medical: Not on file  Tobacco Use  . Smoking status: Never Smoker  . Smokeless tobacco: Never Used  Substance and Sexual Activity  . Alcohol use: Yes    Alcohol/week: 1.0 - 2.0 standard drinks    Types: 1 - 2 Standard drinks or equivalent per week  . Drug use: No  . Sexual activity: Yes    Partners: Male    Birth control/protection: None, Surgical    Comment: TAH  Lifestyle  . Physical activity:    Days per week: Not on file    Minutes per session: Not on file  . Stress: Not on file  Relationships  . Social connections:    Talks on phone:  Not on file    Gets together: Not on file    Attends religious service: Not on file    Active member of club or organization: Not on file    Attends meetings of clubs or organizations: Not on file    Relationship status: Not on file  . Intimate partner violence:    Fear of current or ex partner: Not on file    Emotionally abused: Not on file    Physically abused: Not on file    Forced sexual activity: Not on file  Other Topics Concern  . Not on file  Social History Narrative  . Not on file    Current Outpatient Medications on File Prior to Visit  Medication Sig Dispense Refill  . aspirin 81 MG tablet Take 162 mg by mouth daily.     Marland Kitchen estrogens, conjugated, (PREMARIN) 0.625 MG tablet Take 1 tablet (0.625 mg total) by mouth daily. 2 tablet 0  . hydroxychloroquine (PLAQUENIL) 200 MG tablet Take 1 tablet by mouth daily (Patient  taking differently: 200 mg. Take 2  tablet by mouth daily Mon-Fri None Sun, Sat) 90 tablet 0  . fluconazole (DIFLUCAN) 150 MG tablet Take one tablet today; may repeat in 3 days if symptoms persist (Patient not taking: Reported on 08/24/2018) 2 tablet 0   No current facility-administered medications on file prior to visit.      CARDIOVASCULAR STUDIES  Doppler US Lower Extremities 10/16/2011: Indication: Calf pain, ankle swelling. Summary: No evidence of DVT.  No evidence of venous insufficiency in the lower extremities.  EKG 08/24/2018: Normal sinus rhythm at 61 bpm, normal axis, no evidence of ischemia. Normal EKG  Review of Systems  Constitution: Negative for decreased appetite, malaise/fatigue, weight gain and weight loss.  Eyes: Negative for visual disturbance.  Cardiovascular: Positive for dyspnea on exertion and palpitations. Negative for chest pain, claudication, leg swelling, orthopnea and syncope.  Respiratory: Negative for hemoptysis and wheezing.   Endocrine: Negative for cold intolerance and heat intolerance.  Hematologic/Lymphatic: Does not bruise/bleed easily.       Hyeprcoagulable  Skin: Negative for nail changes.  Musculoskeletal: Positive for joint pain. Negative for muscle weakness and myalgias.  Gastrointestinal: Negative for abdominal pain, change in bowel habit, nausea and vomiting.  Neurological: Negative for difficulty with concentration, dizziness, focal weakness and headaches.  Psychiatric/Behavioral: Negative for altered mental status and suicidal ideas.  All other systems reviewed and are negative.      Objective:   Physical Exam  Constitutional: She is oriented to person, place, and time. Vital signs are normal. She appears well-developed and well-nourished.  HENT:  Head: Normocephalic and atraumatic.  Neck: Normal range of motion.  Cardiovascular: Normal rate, regular rhythm, normal heart sounds and intact distal pulses.  Pulmonary/Chest: Effort normal  and breath sounds normal. No accessory muscle usage. No respiratory distress.  Abdominal: Soft. Bowel sounds are normal.  Musculoskeletal: Normal range of motion.  Neurological: She is alert and oriented to person, place, and time.  Skin: Skin is warm and dry.  Vitals reviewed.         Assessment & Recommendations:   1. Palpitations     2. Raynaud's disease without gangrene   3. Systemic lupus erythematosus, unspecified SLE type, unspecified organ involvement status ([place])   4. Dyspnea on exertion   5. Hypercoagulable state ([place])  Plan: Patient is here for evaluation of palpitations.  Patient has history of lupus, Raynolds, hypercoagulable state that appears to be familial.  Her symptoms of  palpitations for the last few days have improved since stopping Advil PM, but do feel that atrial fibrillation should be excluded.  I will place her on 2-week event monitor for further evaluation.  Given her connective tissue disorder, pulmonary hypertension should also be evaluated.  We will schedule for echocardiogram to exclude any structural abnormalities.  No changes were made to her medications today.  Physical exam is unremarkable.  No evidence of ischemic limb.  No history of hypertension or hyperlipidemia.  We will hold off on stress testing at this point and she is without symptoms of chest pain.  I will see her back after the test for further recommendations and reevaluation.  Thank you for referring this patient. Please do not hesitate to contact me for any questions.    *I have discussed this case with Dr. Virgina Jock and he personally examined the patient and participated in formulating the plan.*    Jeri Lager, [facility], [place] Office: [phone] Fax: [phone]

## 2018-08-27 ENCOUNTER — Ambulatory Visit: Payer: 59 | Admitting: Cardiology

## 2018-08-31 ENCOUNTER — Ambulatory Visit: Payer: 59 | Admitting: Obstetrics & Gynecology

## 2018-08-31 ENCOUNTER — Encounter: Payer: Self-pay | Admitting: Obstetrics & Gynecology

## 2018-08-31 ENCOUNTER — Other Ambulatory Visit: Payer: Self-pay

## 2018-08-31 VITALS — BP 112/80 | HR 68 | Resp 16 | Ht 65.5 in | Wt 139.0 lb

## 2018-08-31 DIAGNOSIS — Z01419 Encounter for gynecological examination (general) (routine) without abnormal findings: Secondary | ICD-10-CM

## 2018-08-31 DIAGNOSIS — Z1211 Encounter for screening for malignant neoplasm of colon: Secondary | ICD-10-CM

## 2018-08-31 MED ORDER — ESTROGENS CONJUGATED 0.625 MG PO TABS: 0.6250 mg | ORAL_TABLET | Freq: Every day | ORAL | 4 refills | Status: DC

## 2018-08-31 NOTE — Progress Notes (Signed)
58 y.o. G1P1 Married White or Caucasian female here for annual exam.  Doing well.  Denies vaginal bleeding.  Having increased joint pain issues over the last few months.  Is being seen every five months.  Saw Dr. Estanislado Pandy in February.    Moved homes this summer.  Sold in a week and needed to move in 3 weeks.  Wanted to decrease yardwork and maintenance.  This was a little stressful.  Had to remodel new home before moving in so had ot live in an apartment.  Glad all of this is done.   Has experienced some palpitations in the last couple of months.  Has seen cardiologist and is going to wear a Holter monitor and have an echocardiogram.  Will have the echo on 3/16 and then have the holter monitor on for two weeks.    Patient's last menstrual period was 10/11/2012.          Sexually active: Yes.    The current method of family planning is status post hysterectomy.    Exercising: Yes.    cardio, weights Smoker:  no  Health Maintenance: Pap:  2012 Neg  History of abnormal Pap:  no MMG:  08/19/17 BIRADS1:neg.  Aware this is due.   Colonoscopy:  2009 f/u 10 years.  Aware this is due as well.   BMD:   never TDaP:  2013 Pneumonia vaccine(s):  2016 Shingrix:   No Hep C testing: 03/29/16 Neg  Screening Labs: PCP   reports that she has never smoked. She has never used smokeless tobacco. She reports current alcohol use of about 1.0 - 2.0 standard drinks of alcohol per week. She reports that she does not use drugs.  Past Medical History:  Diagnosis Date  . 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  . Autoimmune disease ([place])    in the Lupus family  . Fibroid   . Hand pain    left hand-had 2 cortisone injections  . Hx of migraines   . Irregular heart rate 08/24/2018    Past Surgical History:  Procedure Laterality Date  . ABDOMINAL HYSTERECTOMY    . BREAST SURGERY    . PELVIC LAPAROSCOPY      Current Outpatient Medications  Medication Sig Dispense Refill  . aspirin 81 MG tablet Take 162  mg by mouth daily.     Marland Kitchen estrogens, conjugated, (PREMARIN) 0.625 MG tablet Take 1 tablet (0.625 mg total) by mouth daily. 2 tablet 0  . hydroxychloroquine (PLAQUENIL) 200 MG tablet Take 1 tablet by mouth daily (Patient taking differently: 200 mg. Take 2  tablet by mouth daily Mon-Fri None Sun, Sat) 90 tablet 0   No current facility-administered medications for this visit.     Family History  Problem Relation Age of Onset  . Clotting disorder Mother   . Prostate cancer Father   . Cervical cancer Maternal Grandmother   . Healthy Daughter     Review of Systems  Musculoskeletal: Positive for myalgias.  All other systems reviewed and are negative.   Exam:   BP 112/80 (BP Location: Right Arm, Patient Position: Sitting, Cuff Size: Normal)   Pulse 68   Resp 16   Ht 5' 5.5" (1.664 m)   Wt 139 lb (63 kg)   LMP 10/11/2012   BMI 22.78 kg/m   Height: 5' 5.5" (166.4 cm)  Ht Readings from Last 3 Encounters:  08/31/18 5' 5.5" (1.664 m)  08/24/18 5\' 6"  (1.676 m)  08/13/18 5' 2.25" (1.581 m)    General  appearance: alert, cooperative and appears stated age Head: Normocephalic, without obvious abnormality, atraumatic Neck: no adenopathy, supple, symmetrical, trachea midline and thyroid normal to inspection and palpation Lungs: clear to auscultation bilaterally Breasts: normal appearance, no masses or tenderness Heart: regular rate and rhythm Abdomen: soft, non-tender; bowel sounds normal; no masses,  no organomegaly Extremities: extremities normal, atraumatic, no cyanosis or edema Skin: Skin color, texture, turgor normal. No rashes or lesions Lymph nodes: Cervical, supraclavicular, and axillary nodes normal. No abnormal inguinal nodes palpated Neurologic: Grossly normal   Pelvic: External genitalia:  no lesions              Urethra:  normal appearing urethra with no masses, tenderness or lesions              Bartholins and Skenes: normal                 Vagina: normal appearing vagina  with normal color and discharge, no lesions              Cervix: absent              Pap taken: No. Bimanual Exam:  Uterus:  uterus absent              Adnexa: normal adnexa and no mass, fullness, tenderness               Rectovaginal: Confirms               Anus:  normal sphincter tone, no lesions  Chaperone was present for exam.  A:  Well Woman with normal exam Antiphospholipid antibody positive state Autoimmune disease, diagnosis not specific, followed by Dr. Estanislado Pandy H/O TAH 2005 (has ovaries) Recent palpitations, having evaluation On HRT and saw Dr. Marin Olp prior to being continued on this with +APLP state (06/26/16).  He felt this was ok to continue.  P:   Mammogram guidelines reviewed.  Doing yearly MMG pap smear not indicated Colonoscopy is due.  Pt aware.  Referral has been placed. Lab work has been Referral to GI for screening colonoscopy.  (Pathology showed adenomatous polyp.  She is aware she really needs to do this.  Cologuard not appropriated.) RF for Premarin 0.625ng daily.  #90/4RF Reviewed Shingrix vaccination again Return annually or prn

## 2018-09-07 ENCOUNTER — Other Ambulatory Visit: Payer: Self-pay

## 2018-09-07 ENCOUNTER — Ambulatory Visit: Payer: 59

## 2018-09-07 DIAGNOSIS — R0609 Other forms of dyspnea: Secondary | ICD-10-CM | POA: Diagnosis not present

## 2018-09-07 DIAGNOSIS — R002 Palpitations: Secondary | ICD-10-CM | POA: Diagnosis not present

## 2018-09-08 ENCOUNTER — Encounter: Payer: Self-pay | Admitting: Rheumatology

## 2018-09-17 ENCOUNTER — Telehealth: Payer: Self-pay | Admitting: Cardiology

## 2018-09-17 NOTE — Telephone Encounter (Signed)
Echocardiogram results were discussed with the patient. Normal LVEF. No significant valvular abnormality. She was reassured. Currently wearing a heart monitor. Will follow up with virtual visit and further discuss on 04/06.

## 2018-09-20 DIAGNOSIS — R002 Palpitations: Secondary | ICD-10-CM

## 2018-09-24 ENCOUNTER — Encounter: Payer: Self-pay | Admitting: Rheumatology

## 2018-09-24 DIAGNOSIS — M359 Systemic involvement of connective tissue, unspecified: Secondary | ICD-10-CM

## 2018-09-25 MED ORDER — HYDROXYCHLOROQUINE SULFATE 200 MG PO TABS: ORAL_TABLET | ORAL | 0 refills | Status: DC

## 2018-09-25 NOTE — Telephone Encounter (Signed)
I returned patient's call.  She is uncertain if the hair loss is coming from Plaquenil or her autoimmune disease.  We discussed decreasing Plaquenil to 1 a day.  She was in agreement.  She wants to wait on her lab work until the COVID-19 pandemic settles down.  She will have labs in 1 to 2 months.  It is okay to refill her prescription for Plaquenil.

## 2018-09-25 NOTE — Telephone Encounter (Signed)
Last Visit: 08/10/18 Next visit: 01/15/19 Labs: 08/10/18 WNL Plaquenil eye exam: normal on 12/09/16  Okay to refill per Dr. Estanislado Pandy

## 2018-09-27 NOTE — Progress Notes (Signed)
Subjective:   Patient: Alexandra Garner, female    DOB: 1961-04-01, 58 y.o.   MRN: [id number]   Chief Complaint  Patient presents with  . Palpitations  . Results   This visit type was conducted due to national recommendations for restrictions regarding the COVID-19 Pandemic (e.g. social distancing).  This format is felt to be most appropriate for this patient at this time.  All issues noted in this document were discussed and addressed.  No physical exam was performed (except for noted visual exam findings with Telehealth visits).  The patient has consented to conduct a Telehealth visit and understands insurance will be billed.   I discussed the limitations of evaluation and management by telemedicine and the availability of in person appointments. The patient expressed understanding and agreed to proceed.  Virtual Visit via Video Note is as below  I connected with Alexandra Garner, on 09/28/2018 at 1400 by a video enabled telemedicine application and verified that I am speaking with the correct person using two identifiers.     I have discussed with her regarding the safety during COVID Pandemic and steps and precautions including social distancing with the patient.    HPI: Alexandra Garner is a pleasant 58 year old female with lupus, Raynaud's disorder, familial hypercoagulable state, recently evaluated by [place] for palpitations and lightheadedness.   She underwent echocardiogram on 09/07/2018 that was essentially normal. 14 day event monitor revealed symptomatic transmission correlated with sinus rhythm/bradycardia with rare PVC. She is now here to discuss results.  Patient had reported complaints of occasional flip-flopping sensation and also vibratory sensation in her chest in the middle of the night. She has since determined that the vibratory sensation could be related to elevating her bed on a metal frame and possibly be conducted from her refrigerator on the upper level. Since taking her  mattress off the metal frame, she has not noticed the vibratory sensation.   No other complaints today. She does exercise 4-5 days a week and over the last few months has decreased the intensity of her workouts due to joint pain. She does feel that she has decreased exercise capacity from having to cut back due to joint pain.  She is followed by Dr. Estanislado Pandy for lupus and is on Plaquenil. Joint pain is stable. She has [name] disorder and also has familial hypercoagulable state. No hypertension, hyperlipidemia, or thyroid disorders. No former tobacco use.     Past Medical History:  Diagnosis Date  . 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  . 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 Coagulation defect ([place])   . Fibroid   . Hand pain    left hand-had 2 cortisone injections  . Hx of migraines   . Irregular heart rate 08/24/2018  . Lupus ([place])   . RA (rheumatoid arthritis) ([place])     Past Surgical History:  Procedure Laterality Date  . ABDOMINAL HYSTERECTOMY    . BREAST SURGERY    . PELVIC LAPAROSCOPY      Family History  Problem Relation Age of Onset  . Clotting disorder Mother   . Prostate cancer Father   . Cervical cancer Maternal Grandmother   . Healthy Daughter     Social History   Socioeconomic History  . Marital status: Married    Spouse name: Not on file  . Number of children: 1  . Years of education: Not on file  . Highest education level: Not on file  Occupational History  . Not on file  Social Needs  . Financial resource strain: Not on file  . Food insecurity:    Worry: Not on file    Inability: Not on file  . Transportation needs:    Medical: Not on file    Non-medical: Not on file  Tobacco Use  . Smoking status: Never Smoker  . Smokeless tobacco: Never Used  Substance and Sexual Activity  . Alcohol use: Yes    Alcohol/week: 1.0 - 2.0 standard drinks    Types: 1 - 2 Standard drinks or equivalent per week    Comment: occ  . Drug  use: No  . Sexual activity: Yes    Partners: Male    Birth control/protection: None, Surgical    Comment: TAH  Lifestyle  . Physical activity:    Days per week: Not on file    Minutes per session: Not on file  . Stress: Not on file  Relationships  . Social connections:    Talks on phone: Not on file    Gets together: Not on file    Attends religious service: Not on file    Active member of club or organization: Not on file    Attends meetings of clubs or organizations: Not on file    Relationship status: Not on file  . Intimate partner violence:    Fear of current or ex partner: Not on file    Emotionally abused: Not on file    Physically abused: Not on file    Forced sexual activity: Not on file  Other Topics Concern  . Not on file  Social History Narrative  . Not on file    Current Outpatient Medications on File Prior to Visit  Medication Sig Dispense Refill  . aspirin 81 MG tablet Take 81 mg by mouth 2 (two) times daily.     Marland Kitchen estrogens, conjugated, (PREMARIN) 0.625 MG tablet Take 1 tablet (0.625 mg total) by mouth daily. 90 tablet 4  . hydroxychloroquine (PLAQUENIL) 200 MG tablet Take 1 tablet by mouth BID daily Mon-Fri None Sun, Sat 120 tablet 0   No current facility-administered medications on file prior to visit.      CARDIOVASCULAR STUDIES  14 day event monitor 03/16-03/29/2020: NSR. Symptomatic transmissions correlated with sinus rhythm/bradycardia with rare PVC. No A fib was noted.   Echo 09/07/2018: Left ventricle cavity is normal in size. Normal global wall motion. Normal diastolic filling pattern. Calculated EF 56%. Structurally normal mitral valve with trace regurgitation. Structurally normal tricuspid valve with trace regurgitation.  Doppler US Lower Extremities 10/16/2011: Indication: Calf pain, ankle swelling. Summary: No evidence of DVT.  No evidence of venous insufficiency in the lower extremities.  EKG 08/24/2018: Normal sinus rhythm at 61 bpm,  normal axis, no evidence of ischemia. Normal EKG  Review of Systems  Constitution: Negative for decreased appetite, malaise/fatigue, weight gain and weight loss.  Eyes: Negative for visual disturbance.  Cardiovascular: Positive for dyspnea on exertion and palpitations. Negative for chest pain, claudication, leg swelling, orthopnea and syncope.  Respiratory: Negative for hemoptysis and wheezing.   Endocrine: Negative for cold intolerance and heat intolerance.  Hematologic/Lymphatic: Does not bruise/bleed easily.       Hyeprcoagulable  Skin: Negative for nail changes.  Musculoskeletal: Positive for joint pain. Negative for muscle weakness and myalgias.  Gastrointestinal: Negative for abdominal pain, change in bowel habit, nausea and vomiting.  Neurological: Negative for difficulty with concentration, dizziness, focal weakness and headaches.  Psychiatric/Behavioral: Negative for  altered mental status and suicidal ideas.  All other systems reviewed and are negative.      Objective:   Physical Exam  Constitutional: She is oriented to person, place, and time. She appears well-developed and well-nourished. No distress.  Neck: Normal range of motion. No thyromegaly present.  Pulmonary/Chest: Effort normal. No respiratory distress.  Neurological: She is alert and oriented to person, place, and time.  Psychiatric: She has a normal mood and affect. Her behavior is normal.  Vitals reviewed.       Assessment & Recommendations:   1. Palpitations I have discussed event monitor results with the patient. Her vibratory sensation in her chest likely related to her refrigerator and mattress being on metal frame. She did have rare PVC that is likely her sensation of flip flopping. Etiology for PVC's was discussed. I have reassured her. No A fib was noted. Unless symptoms worsen, no further evaluation needed.  2. Dyspnea on exertion Suspect related to decreasing her exercise due to joint pain. Has  mild symptoms. Echocardiogram was discussed and she was reassured. Stress testing was not performed as she is minimally symptomatic and no symptoms of chest pain, although if symptoms worsen, would have low threshold for this given her autoimmune disorder.  3. Systemic lupus erythematosus, unspecified SLE type, unspecified organ involvement status ([place]) Managed by Dr. Estanislado Pandy and is stable per patient.    Plan: Do not feel that patient needs further cardiac workup at this point. Encouraged her to contact for any worsening problems or for any new concerns and I will be happy to see her at that time.     Jeri Lager, FNP-C [facility], [place] Office: [phone] Fax: [phone]

## 2018-09-28 ENCOUNTER — Other Ambulatory Visit: Payer: Self-pay

## 2018-09-28 ENCOUNTER — Encounter: Payer: Self-pay | Admitting: Cardiology

## 2018-09-28 ENCOUNTER — Ambulatory Visit (INDEPENDENT_AMBULATORY_CARE_PROVIDER_SITE_OTHER): Payer: 59 | Admitting: Cardiology

## 2018-09-28 DIAGNOSIS — R002 Palpitations: Secondary | ICD-10-CM

## 2018-09-28 DIAGNOSIS — R0609 Other forms of dyspnea: Secondary | ICD-10-CM | POA: Diagnosis not present

## 2018-09-28 DIAGNOSIS — M329 Systemic lupus erythematosus, unspecified: Secondary | ICD-10-CM

## 2018-10-01 ENCOUNTER — Encounter: Payer: Self-pay | Admitting: Cardiology

## 2018-10-01 NOTE — Progress Notes (Signed)
Reviewed report/notes and updated pt's chart/history/PL and/or HM accordingly. 

## 2018-10-15 ENCOUNTER — Encounter: Payer: Self-pay | Admitting: Rheumatology

## 2018-10-15 ENCOUNTER — Other Ambulatory Visit: Payer: Self-pay | Admitting: *Deleted

## 2018-10-15 DIAGNOSIS — M359 Systemic involvement of connective tissue, unspecified: Secondary | ICD-10-CM | POA: Diagnosis not present

## 2018-10-15 NOTE — Addendum Note (Signed)
Addended by: Carole Binning on: 10/15/2018 09:34 AM   Modules accepted: Orders

## 2018-10-16 LAB — URINALYSIS, ROUTINE W REFLEX MICROSCOPIC
Bilirubin, UA: NEGATIVE
Glucose, UA: NEGATIVE
Leukocytes,UA: NEGATIVE
Nitrite, UA: NEGATIVE
Protein,UA: NEGATIVE
RBC, UA: NEGATIVE
Specific Gravity, UA: 1.029 (ref 1.005–1.030)
Urobilinogen, Ur: 1 mg/dL (ref 0.2–1.0)
pH, UA: 5 (ref 5.0–7.5)

## 2018-10-16 LAB — C3 AND C4
Complement C3, Serum: 94 mg/dL (ref 82–167)
Complement C4, Serum: 11 mg/dL — ABNORMAL LOW (ref 14–44)

## 2018-10-16 NOTE — Telephone Encounter (Signed)
Patient [facility] requesting call regarding labs.

## 2018-10-16 NOTE — Progress Notes (Signed)
UA is normal. Complements are better. No change in the tt advised.

## 2018-10-16 NOTE — Telephone Encounter (Signed)
Patient advised to continue her current dose of PLQ as it is helping her disease process. Patient verbalized understanding.

## 2018-10-16 NOTE — Telephone Encounter (Signed)
Returned call to patient. Patient states she has noticed some ringing in her ears. She is not sure if this may be due to the increase dose of PLQ. Patient is taking PLQ BID M-F. Patient states she is also taking two baby ASA daily.   Per Dr. Estanislado Pandy encourage patient to continue medication at current dose as it is helping her disease process. If she wants to decrease advise patient she can take 2 tablets one day and 1 tablet the next day and alternate.   Attempted to contact the patient and left message for patient to call the office.

## 2018-11-04 ENCOUNTER — Other Ambulatory Visit: Payer: Self-pay | Admitting: Physician Assistant

## 2018-11-04 DIAGNOSIS — M359 Systemic involvement of connective tissue, unspecified: Secondary | ICD-10-CM

## 2018-11-04 NOTE — Telephone Encounter (Signed)
Last Visit: 08/10/18 Next visit: 01/15/19 Labs: 08/10/18 WNL Plaquenil eye exam: normal on 12/09/16   Patient states she will have her eye doctor send her last visit to [place]. Patient is not sure that they did the field of vision at her last visit. Patient states she will schedule to have that done if they did not.  Okay to refill 30 day supply per Dr. Estanislado Pandy

## 2018-11-13 DIAGNOSIS — Z79899 Other long term (current) drug therapy: Secondary | ICD-10-CM | POA: Diagnosis not present

## 2018-11-23 ENCOUNTER — Encounter: Payer: Self-pay | Admitting: Internal Medicine

## 2018-12-07 ENCOUNTER — Other Ambulatory Visit: Payer: Self-pay

## 2018-12-07 ENCOUNTER — Ambulatory Visit: Payer: 59 | Admitting: *Deleted

## 2018-12-07 VITALS — Ht 66.5 in | Wt 135.0 lb

## 2018-12-07 DIAGNOSIS — Z86010 Personal history of colonic polyps: Secondary | ICD-10-CM

## 2018-12-07 MED ORDER — NA SULFATE-K SULFATE-MG SULF 17.5-3.13-1.6 GM/177ML PO SOLN: ORAL | 0 refills | Status: DC

## 2018-12-07 NOTE — Progress Notes (Signed)
Patient's pre-visit was done today over the phone with the patient due to COVID-19 pandemic. Name,DOB and address verified. Insurance verified. Packet of Prep instructions mailed to patient including copy of a consent form and pre-procedure patient acknowledgement form and Suprep coupon-pt is aware. Patient understands to call us back with any questions or concerns.   Patient denies any allergies to eggs or soy. Patient denies any problems with anesthesia/sedation. Patient denies any oxygen use at home. Patient denies taking any diet/weight loss medications or blood thinners. emmi declined by the pt.  Pt is aware that care partner will wait in the car during parking lot; if they feel like they will be too hot to wait in the car; they may wait in the lobby.  We want them to wear a mask (we do not have any that we can provide them), practice social distancing, and we will check their temperatures when they get here.  I did remind patient that their care partner needs to stay in the parking lot the entire time. Pt will wear mask into building

## 2018-12-09 ENCOUNTER — Encounter: Payer: Self-pay | Admitting: Internal Medicine

## 2018-12-14 ENCOUNTER — Telehealth: Payer: Self-pay | Admitting: Pharmacist

## 2018-12-14 ENCOUNTER — Encounter: Payer: Self-pay | Admitting: Rheumatology

## 2018-12-14 DIAGNOSIS — M359 Systemic involvement of connective tissue, unspecified: Secondary | ICD-10-CM

## 2018-12-14 MED ORDER — HYDROXYCHLOROQUINE SULFATE 200 MG PO TABS: 200.0000 mg | ORAL_TABLET | Freq: Every day | ORAL | 2 refills | Status: DC

## 2018-12-14 NOTE — Telephone Encounter (Signed)
Returned patient call. She endorses hair loss,diarrhea, ringing in the ears, and brain "shake" that keep her up at night with the increased dose of Plaquenil. The side effects have become unmanageable and decrease her quality of life.  Informed patient that ringing in the ears and diarrhea can be associated with Plaquenil but not typically hair loss.  Plaquenil can actually help with hair loss.  Instructed patient to decrease Plaquenil dose back to the 1 tablet daily which se tolerated in the past.    She has a follow up scheduled for 7/24.  Instructed patient to keep follow up appointment and to return sooner if she experiences a flare or side effects do not improve.  We will do autoimmune lab work at next appointment and discuss adding another medication to help manage her condition. Patient verbalized understanding.    All questions encouraged and answered.  Instructed patient to call with any further questions or concerns.  Mariella Saa, PharmD, [facility] Rheumatology Clinical Pharmacist  12/14/2018 10:51 AM

## 2018-12-14 NOTE — Telephone Encounter (Signed)
Thank you for informing me.  We will further address at her upcoming follow up visit.

## 2018-12-17 ENCOUNTER — Telehealth: Payer: Self-pay | Admitting: Internal Medicine

## 2018-12-17 NOTE — Telephone Encounter (Signed)
Spoke with patient regarding screening questions Covid-19 Screening Questions:  Do you now or have you had a fever in the last 14 days? no  Do you have any respiratory symptoms of shortness of breath or cough now or in the last 14 days? no  Do you have any family members or close contacts with diagnosed or suspected Covid-19 in the past 14 days? no  Have you been tested for Covid-19 and found to be positive? no  Pt made aware of that care partner may wait in the car or come up to the lobby during the procedure but will need to provide their own mask.

## 2018-12-18 ENCOUNTER — Ambulatory Visit (AMBULATORY_SURGERY_CENTER): Payer: 59 | Admitting: Internal Medicine

## 2018-12-18 ENCOUNTER — Encounter: Payer: Self-pay | Admitting: Internal Medicine

## 2018-12-18 ENCOUNTER — Other Ambulatory Visit: Payer: Self-pay

## 2018-12-18 VITALS — BP 108/60 | HR 53 | Temp 97.4°F | Resp 13 | Ht 66.5 in | Wt 135.0 lb

## 2018-12-18 DIAGNOSIS — Z86010 Personal history of colonic polyps: Secondary | ICD-10-CM

## 2018-12-18 DIAGNOSIS — K599 Functional intestinal disorder, unspecified: Secondary | ICD-10-CM

## 2018-12-18 DIAGNOSIS — R197 Diarrhea, unspecified: Secondary | ICD-10-CM | POA: Diagnosis not present

## 2018-12-18 MED ORDER — SODIUM CHLORIDE 0.9 % IV SOLN: 500.0000 mL | INTRAVENOUS | Status: DC

## 2018-12-18 NOTE — Patient Instructions (Signed)
YOU HAD AN ENDOSCOPIC PROCEDURE TODAY AT THE [facility]:   Refer to the procedure report that was given to you for any specific questions about what was found during the examination.  If the procedure report does not answer your questions, please call your gastroenterologist to clarify.  If you requested that your care partner not be given the details of your procedure findings, then the procedure report has been included in a sealed envelope for you to review at your convenience later.  YOU SHOULD EXPECT: Some feelings of bloating in the abdomen. Passage of more gas than usual.  Walking can help get rid of the air that was put into your GI tract during the procedure and reduce the bloating. If you had a lower endoscopy (such as a colonoscopy or flexible sigmoidoscopy) you may notice spotting of blood in your stool or on the toilet paper. If you underwent a bowel prep for your procedure, you may not have a normal bowel movement for a few days.  Please Note:  You might notice some irritation and congestion in your nose or some drainage.  This is from the oxygen used during your procedure.  There is no need for concern and it should clear up in a day or so.  SYMPTOMS TO REPORT IMMEDIATELY:   Following lower endoscopy (colonoscopy or flexible sigmoidoscopy):  Excessive amounts of blood in the stool  Significant tenderness or worsening of abdominal pains  Swelling of the abdomen that is new, acute  Fever of 100F or higher  For urgent or emergent issues, a gastroenterologist can be reached at any hour by calling [phone].   DIET:  We do recommend a small meal at first, but then you may proceed to your regular diet.  Drink plenty of fluids but you should avoid alcoholic beverages for 24 hours.  ACTIVITY:  You should plan to take it easy for the rest of today and you should NOT DRIVE or use heavy machinery until tomorrow (because of the sedation medicines used during the test).     FOLLOW UP: Our staff will call the number listed on your records 48-72 hours following your procedure to check on you and address any questions or concerns that you may have regarding the information given to you following your procedure. If we do not reach you, we will leave a message.  We will attempt to reach you two times.  During this call, we will ask if you have developed any symptoms of COVID 19. If you develop any symptoms (ie: fever, flu-like symptoms, shortness of breath, cough etc.) before then, please call [phone].  If you test positive for Covid 19 in the 2 weeks post procedure, please call and report this information to us.    If any biopsies were taken you will be contacted by phone or by letter within the next 1-3 weeks.  Please call us at [phone] if you have not heard about the biopsies in 3 weeks.    SIGNATURES/CONFIDENTIALITY: You and/or your care partner have signed paperwork which will be entered into your electronic medical record.  These signatures attest to the fact that that the information above on your After Visit Summary has been reviewed and is understood.  Full responsibility of the confidentiality of this discharge information lies with you and/or your care-partner. 

## 2018-12-18 NOTE — Op Note (Signed)
[place] Patient Name: Alexandra Garner Procedure Date: 12/18/2018 11:42 AM MRN: [id number] Endoscopist: Docia Chuck. Henrene Pastor , MD Age: 58 Referring MD:  Date of Birth: [birth year] Gender: Female Account #: [id number] Procedure:                Colonoscopy with biopsies Indications:              High risk colon cancer surveillance: Personal                            history of non-advanced adenoma. Index examination                            October 2009 incidental intermittent diarrhea for                            several months coincident with increased dose of                            hydroxychloroquine Medicines:                Monitored Anesthesia Care Procedure:                Pre-Anesthesia Assessment:                           - Prior to the procedure, a History and Physical                            was performed, and patient medications and                            allergies were reviewed. The patient's tolerance of                            previous anesthesia was also reviewed. The risks                            and benefits of the procedure and the sedation                            options and risks were discussed with the patient.                            All questions were answered, and informed consent                            was obtained. Prior Anticoagulants: The patient has                            taken no previous anticoagulant or antiplatelet                            agents. ASA Grade Assessment: II - A patient with  mild systemic disease. After reviewing the risks                            and benefits, the patient was deemed in                            satisfactory condition to undergo the procedure.                           After obtaining informed consent, the colonoscope                            was passed under direct vision. Throughout the                            procedure, the patient's blood  pressure, pulse, and                            oxygen saturations were monitored continuously. The                            Colonoscope was introduced through the anus and                            advanced to the the cecum, identified by                            appendiceal orifice and ileocecal valve. The                            ileocecal valve, appendiceal orifice, and rectum                            were photographed. The quality of the bowel                            preparation was excellent. The colonoscopy was                            performed without difficulty. The patient tolerated                            the procedure well. The bowel preparation used was                            SUPREP via split dose instruction. Scope In: 11:51:26 AM Scope Out: 12:09:47 PM Scope Withdrawal Time: 0 hours 13 minutes 47 seconds  Total Procedure Duration: 0 hours 18 minutes 21 seconds  Findings:                 The entire examined colon appeared normal on direct                            and retroflexion views. Biopsies for histology were  taken with a cold forceps from the entire colon for                            evaluation of microscopic colitis. Complications:            No immediate complications. Estimated blood loss:                            None. Estimated Blood Loss:     Estimated blood loss: none. Impression:               - The entire examined colon is normal on direct and                            retroflexion views. Recommendation:           - Repeat colonoscopy in 10 years for surveillance.                           - Patient has a contact number available for                            emergencies. The signs and symptoms of potential                            delayed complications were discussed with the                            patient. Return to normal activities tomorrow.                            Written discharge  instructions were provided to the                            patient.                           - Resume previous diet.                           - Continue present medications.                           - Await pathology results. Dr. Henrene Pastor will schedule                            letter with the results. Docia Chuck. Henrene Pastor, MD 12/18/2018 12:19:51 PM This report has been signed electronically.

## 2018-12-18 NOTE — Progress Notes (Signed)
Temp by CW and vitals by JB

## 2018-12-18 NOTE — Progress Notes (Signed)
PT taken to PACU. Monitors in place. VSS. Report given to RN. 

## 2018-12-18 NOTE — Progress Notes (Signed)
Called to room to assist during endoscopic procedure.  Patient ID and intended procedure confirmed with present staff. Received instructions for my participation in the procedure from the performing physician.  

## 2018-12-22 ENCOUNTER — Telehealth: Payer: Self-pay | Admitting: *Deleted

## 2018-12-22 ENCOUNTER — Encounter: Payer: Self-pay | Admitting: Internal Medicine

## 2018-12-22 NOTE — Telephone Encounter (Signed)
  Follow up Call-  Call back number 12/18/2018  Post procedure Call Back phone  # [phone]  Permission to leave phone message Yes  Some recent data might be hidden     Patient questions:  Do you have a fever, pain , or abdominal swelling? No. Pain Score  0 *  Have you tolerated food without any problems? Yes.    Have you been able to return to your normal activities? Yes.    Do you have any questions about your discharge instructions: Diet   No. Medications  No. Follow up visit  No.  Do you have questions or concerns about your Care? No.  Actions: * If pain score is 4 or above: No action needed, pain <4.   1. Have you developed a fever since your procedure? NO  2.   Have you had an respiratory symptoms (SOB or cough) since your procedure? NO  3.   Have you tested positive for COVID 19 since your procedure NO  4.   Have you had any family members/close contacts diagnosed with the COVID 19 since your procedure?  NO   If yes to any of these questions please route to Joylene John, RN and Alphonsa Gin, RN.

## 2018-12-31 ENCOUNTER — Encounter: Payer: Self-pay | Admitting: Family Medicine

## 2019-01-04 NOTE — Progress Notes (Signed)
Office Visit Note  Patient: Alexandra Garner             Date of Birth: [birth year]           MRN: [id number]             PCP: Leamon Arnt, MD Referring: Leamon Arnt, MD Visit Date: 01/18/2019 Occupation: @GUAROCC @  Subjective:  Medication monitoring   History of Present Illness: Alexandra Garner is a 58 y.o. female with history of autoimmune disease, Raynaud's, and osteoarthritis.  She is taking Plaquenil 200 mg 1 tablet po daily and Aspirin 81 mg po daily.  She has noticed improvement since reducing the dose of Plaquenil.  She states that she does continue to have tinnitus in bilateral ears and will be following up with her ENT for further evaluation.  She states that the tinnitus did not improve after reducing the dose of Plaquenil so she thinks is unrelated.  She states her hair loss has been unchanged.  She is not had any recent rashes.  She states that she has been wearing sunscreen as well as hats if she plans on being outside.  She states that she is been trying to avoid the sunlight.  She continues have intermittent symptoms of Raynaud's but her symptoms have improved during the summer.  She continues to have dryness and cracking of the distal tip of the left thumb.  She often wears a bandage when she has an open sore.  She has a chronic nasal ulceration but denies any new sores in her mouth or nose.  She denies any sicca symptoms.  She denies any palpitations or shortness of breath.  She states that she has been having some worsening fatigue recently but relates it to insomnia.  She denies any fevers or swollen lymph nodes.  She has not had any recent infections.  She continues have intermittent pain in bilateral hands but denies any joint swelling.  She is trochanter bursitis bilaterally.  She experiences nocturnal pain and has to switch sides she sleeps on at night.      Activities of Daily Living:  Patient reports morning stiffness for 5  minutes.   Patient Reports nocturnal pain.   Difficulty dressing/grooming: Denies Difficulty climbing stairs: Denies Difficulty getting out of chair: Denies Difficulty using hands for taps, buttons, cutlery, and/or writing: Denies  Review of Systems  Constitutional: Positive for fatigue.  HENT: Positive for nose dryness. Negative for mouth sores and mouth dryness.   Eyes: Negative for pain, visual disturbance and dryness.  Respiratory: Negative for cough, hemoptysis, shortness of breath and difficulty breathing.   Cardiovascular: Negative for chest pain, palpitations, hypertension and swelling in legs/feet.  Gastrointestinal: Negative for blood in stool, constipation and diarrhea.  Endocrine: Negative for increased urination.  Genitourinary: Negative for painful urination.  Musculoskeletal: Positive for arthralgias and joint pain. Negative for joint swelling, myalgias, muscle weakness, morning stiffness, muscle tenderness and myalgias.  Skin: Positive for color change. Negative for pallor, rash, hair loss, nodules/bumps, skin tightness, ulcers and sensitivity to sunlight.  Allergic/Immunologic: Negative for susceptible to infections.  Neurological: Negative for dizziness, numbness, headaches and weakness.  Hematological: Negative for swollen glands.  Psychiatric/Behavioral: Negative for depressed mood and sleep disturbance. The patient is not nervous/anxious.     PMFS History:  Patient Active Problem List   Diagnosis Date Noted  . Palpitations 08/24/2018  . Dyshidrotic eczema 11/14/2017  . Antiphospholipid syndrome ([place]) 11/14/2017  . 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 Hormone  replacement therapy (HRT) 11/14/2017  . History of migraine 11/07/2017  . Autoimmune disease ([place]) 10/20/2016  . High risk medication use 09/26/2016  . Raynaud's disease without gangrene 09/19/2016  . Primary osteoarthritis of both feet 09/19/2016  . Family history of macular degeneration 02/03/2015  . Primary localized osteoarthrosis of hand  12/23/2013  . History of colonic polyps 11/01/2008    Past Medical History:  Diagnosis Date  . 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  . 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 Coagulation defect ([place])    hypercoagulation-[name]   . Fibroid   . Hand pain    left hand-had 2 cortisone injections  . Hx of migraines   . Irregular heart rate 08/24/2018  . Lupus ([place])   . Post-operative nausea and vomiting   . RA (rheumatoid arthritis) ([facility])     Family History  Problem Relation Age of Onset  . Clotting disorder Mother   . Prostate cancer Father   . Colon polyps Father   . Cervical cancer Maternal Grandmother   . Healthy Daughter   . Colon cancer Neg Hx   . Esophageal cancer Neg Hx   . Rectal cancer Neg Hx   . Stomach cancer Neg Hx    Past Surgical History:  Procedure Laterality Date  . ABDOMINAL HYSTERECTOMY    . BREAST SURGERY    . COLONOSCOPY  03/28/2008  . PELVIC LAPAROSCOPY     Social History   Social History Narrative  . Not on file   Immunization History  Administered Date(s) Administered  . Tdap 10/01/2011     Objective: Vital Signs: BP 125/70 (BP Location: Left Arm, Patient Position: Sitting, Cuff Size: Small)   Pulse (!) 59   Resp 12   Ht 5\' 6"  (1.676 m)   Wt 140 lb 12.8 oz (63.9 kg)   LMP 10/11/2012   BMI 22.73 kg/m    Physical Exam Vitals signs and nursing note reviewed.  Constitutional:      Appearance: She is well-developed.  HENT:     Head: Normocephalic and atraumatic.  Eyes:     Conjunctiva/sclera: Conjunctivae normal.  Neck:     Musculoskeletal: Normal range of motion.  Cardiovascular:     Rate and Rhythm: Normal rate and regular rhythm.     Heart sounds: Normal heart sounds.  Pulmonary:     Effort: Pulmonary effort is normal.     Breath sounds: Normal breath sounds.  Abdominal:     General: Bowel sounds are normal.     Palpations: Abdomen is soft.  Lymphadenopathy:     Cervical: No cervical adenopathy.  Skin:     General: Skin is warm and dry.     Capillary Refill: Capillary refill takes less than 2 seconds.     Comments: Skin dryness and cracking of the fingertip of the left thumb noted  Neurological:     Mental Status: She is alert and oriented to person, place, and time.  Psychiatric:        Behavior: Behavior normal.      Musculoskeletal Exam: C-spine, thoracic spine, lumbar spine good range of motion.  No midline spinal tenderness.  No SI joint tenderness.  Shoulder joints, elbow joints, wrist joints, MCPs, PIPs, DIPs good range of motion with no synovitis.  DIP synovial thickening consistent with osteoarthritis. Hip joints have good range of motion with no discomfort.  Knee joints, ankle joints, MTPs, PIPs and DIPs good range of motion no synovitis.  No warmth  or effusion bilateral knee joints.  No tenderness or swelling of ankle joints.  She has tenderness over bilateral trochanteric bursa.  CDAI Exam: CDAI Score: - Patient Global: -; Provider Global: - Swollen: -; Tender: - Joint Exam   No joint exam has been documented for this visit   There is currently no information documented on the homunculus. Go to the Rheumatology activity and complete the homunculus joint exam.  Investigation: No additional findings.  Imaging: No results found.  Recent Labs: Lab Results  Component Value Date   WBC 4.4 08/10/2018   HGB 13.1 08/10/2018   PLT 244 08/10/2018   NA 140 08/10/2018   K 4.2 08/10/2018   CL 106 08/10/2018   CO2 30 08/10/2018   GLUCOSE 69 08/10/2018   BUN 11 08/10/2018   CREATININE 0.77 08/10/2018   BILITOT 0.4 08/10/2018   ALKPHOS 34 (L) 01/06/2018   AST 20 08/10/2018   ALT 16 08/10/2018   PROT 6.7 08/10/2018   ALBUMIN 4.2 01/06/2018   CALCIUM 9.3 08/10/2018   GFRAA 100 08/10/2018    Speciality Comments: Plaquenil eye exam: normal on 11/13/18 per [facility]  Procedures:  No procedures performed Allergies: Penicillins, Adhesive [tape], and Latex    Assessment / Plan:     Visit Diagnoses: Autoimmune disease ([place]) - History of arthralgias, [name], photosensitivity, positive aCL IgM46, positive beta 2 100: She has not had any signs or symptoms of a flare recently.  She is clinically doing well on Plaquenil 200 mg 1 tablet by mouth daily.  She reduced the dose of Plaquenil 1 month ago due to the concern for tinnitus and diarrhea.  Her symptoms of tinnitus have not improved and are unrelated to Plaquenil dosing.  She will be following up with her ENT for further evaluation.  She will continue taking Plaquenil as prescribed.  A refill was sent to the pharmacy today.  She has no synovitis on exam.  She continues have intermittent pain in both hands but no tenderness or inflammation was noted today.  She has not had any recent rashes.  She has been wearing sunscreen on a regular basis as well as a hat and trying to avoid the prime hours of sunlight.  She continues to have 1 chronic nasal ulceration but is not had any new sores in her mouth or nose.  She denies any sicca symptoms.  Her symptoms of Raynaud's have improved during the summer.  She continues to have skin dryness and cracking on the fingertip of the left thumb which she applies Aquaphor and a bandage to as needed.  She continues to take aspirin 162 mg by mouth daily.  She has not had any shortness of breath or palpitations.  She has been having worsening fatigue related to insomnia.  She denies any fevers or swollen lymph nodes recently.  She will continue on the current treatment regimen.  We will check autoimmune lab work today.  She was advised to notify us if develops any new or worsening symptoms- Plan: Urinalysis, Routine w reflex microscopic, COMPLETE METABOLIC PANEL WITH GFR, CBC with Differential/Platelet, Anti-DNA antibody, double-stranded, C3 and C4, Sedimentation rate, hydroxychloroquine (PLAQUENIL) 200 MG tablet  High risk medication use - PLQ 200 mg 1 tablet by mouth daily.  eye exam:  11/13/2018.  CBC and CMP were drawn today to monitor for drug toxicity.- Plan: COMPLETE METABOLIC PANEL WITH GFR, CBC with Differential/Platelet  Raynaud's disease without gangrene - She has intermittent symptoms of Raynaud's.  Her symptoms have  improved during the summer.  She continues to have dry skin and cracking on the fingertip of the left thumb.  We discussed the importance of wearing gloves and keeping her core body temperature warm.  She will continue taking aspirin 162 mg daily.  She was advised to notify us if she develops any signs of gangrene.  Primary osteoarthritis of both hands -She has DIP synovial thickening consistent with osteoarthritis of both hands.  She has no tenderness or synovitis.  Joint protection and muscle strengthening were discussed.   Primary osteoarthritis of both feet -She has mild osteoarthritic changes in both feet.  She has no joint swelling at this time.  She wears proper fitting shoes.   Vitamin D deficiency: Vitamin D was 41 on 12/29/17  Antiphospholipid antibody positive - She takes aspirin 162 mg po daily.    Trochanteric bursitis of both hips -She has tenderness over bilateral trochanter bursa.  She experiences nocturnal pain intermittently.  She was encouraged to perform stretching exercises on a regular basis.  History of colonic polyps -She had a colonoscopy performed on 12/18/2018, which normal.  Repeat colonoscopy in 10 years.   Positive cardiolipin antibodies, positive b2 GP1 - She takes aspirin 162 mg po daily.   Other fatigue - She has been experiencing worsening fatigue related to insomnia.  She has been having difficulty sleeping at night as well as staying asleep.  She continues to workout on a regular basis and remains very active.  History of migraine   Orders: Orders Placed This Encounter  Procedures  . Urinalysis, Routine w reflex microscopic  . COMPLETE METABOLIC PANEL WITH GFR  . CBC with Differential/Platelet  . Anti-DNA antibody,  double-stranded  . C3 and C4  . Sedimentation rate   Meds ordered this encounter  Medications  . hydroxychloroquine (PLAQUENIL) 200 MG tablet    Sig: Take 1 tablet (200 mg total) by mouth daily.    Dispense:  90 tablet    Refill:  0      Follow-Up Instructions: Return in about 5 months (around 06/20/2019) for Autoimmune Disease.   Ofilia Neas, PA-C  Note - This record has been created using Dragon software.  Chart creation errors have been sought, but may not always  have been located. Such creation errors do not reflect on  the standard of medical care.

## 2019-01-07 ENCOUNTER — Other Ambulatory Visit: Payer: Self-pay

## 2019-01-07 ENCOUNTER — Ambulatory Visit: Payer: 59 | Admitting: Family Medicine

## 2019-01-07 ENCOUNTER — Encounter: Payer: Self-pay | Admitting: Family Medicine

## 2019-01-07 VITALS — BP 118/74 | HR 66 | Temp 98.0°F | Resp 14 | Ht 65.5 in | Wt 136.2 lb

## 2019-01-07 DIAGNOSIS — L659 Nonscarring hair loss, unspecified: Secondary | ICD-10-CM

## 2019-01-07 DIAGNOSIS — H9313 Tinnitus, bilateral: Secondary | ICD-10-CM

## 2019-01-07 LAB — TSH: TSH: 2.78 u[IU]/mL (ref 0.35–4.50)

## 2019-01-07 LAB — T4, FREE: Free T4: 0.69 ng/dL (ref 0.60–1.60)

## 2019-01-07 NOTE — Patient Instructions (Addendum)
Please return in 12 months for your annual complete physical; please come fasting.  I have placed a referral to ENT for further evaluation of your tinnitus.   If you have any questions or concerns, please don't hesitate to send me a message via MyChart or call the office at [phone]. Thank you for visiting with [place] today! It's our pleasure caring for you.   Tinnitus Tinnitus refers to hearing a sound when there is no actual source for that sound. This is often described as ringing in the ears. However, people with this condition may hear a variety of noises, in one ear or in both ears. The sounds of tinnitus can be soft, loud, or somewhere in between. Tinnitus can last for a few seconds or can be constant for days. It may go away without treatment and come back at various times. When tinnitus is constant or happens often, it can lead to other problems, such as trouble sleeping and trouble concentrating. Almost everyone experiences tinnitus at some point. Tinnitus that is long-lasting (chronic) or comes back often (recurs) may require medical attention. What are the causes? The cause of tinnitus is often not known. In some cases, it can result from other problems or conditions, including:  Exposure to loud noises from machinery, music, or other sources.  Hearing loss.  Ear or sinus infections.  Earwax buildup.  An object (foreign body) stuck in the ear.  Taking certain medicines.  Drinking alcohol or caffeine.  High blood pressure.  Heart diseases.  Anemia.  Allergies.  Meniere's disease.  Thyroid problems.  Tumors.  A weak, bulging blood vessel (aneurysm) near the ear.  Depression or other mood disorders. What are the signs or symptoms? The main symptom of tinnitus is hearing a sound when there is no source for that sound. It may sound like:  Buzzing.  Roaring.  Ringing.  Blowing air, like the sound heard when you listen to a seashell.  Hissing.   Whistling.  Sizzling.  Humming.  Running water.  A musical note.  Tapping. Symptoms may affect only one ear (unilateral) or both ears (bilateral). How is this diagnosed? Tinnitus is diagnosed based on your symptoms, your medical history, and a physical exam. Your health care provider may do a thorough hearing test (audiologic exam) if your tinnitus:  Is unilateral.  Causes hearing difficulties.  Lasts 6 months or longer. You may work with a health care provider who specializes in hearing disorders (audiologist). You may be asked questions about your symptoms and how they affect your daily life. You may have other tests done, such as:  CT scan.  MRI.  An imaging test of how blood flows through your blood vessels (angiogram). How is this treated? Treating an underlying medical condition can sometimes make tinnitus go away. If your tinnitus continues, other treatments may include:  Medicines, such as antidepressants or sleeping aids.  Sound generators to mask the tinnitus. These include: ? Tabletop sound machines that play relaxing sounds to help you fall asleep. ? Wearable devices that fit in your ear and play sounds or music. ? Acoustic neural stimulation. This involves using headphones to listen to music that contains an auditory signal. Over time, listening to this signal may change some pathways in your brain and make you less sensitive to tinnitus. This treatment is used for very severe cases when no other treatment is working.  Therapy and counseling to help you manage the stress of living with tinnitus.  Using hearing aids or cochlear  implants if your tinnitus is related to hearing loss. Hearing aids are worn in the outer ear. Cochlear implants are surgically placed in the inner ear. Follow these instructions at home: Managing symptoms      When possible, avoid being in loud places and being exposed to loud sounds.  Wear hearing protection, such as earplugs, when  you are exposed to loud noises.  Use a white noise machine, a humidifier, or other devices to mask the sound of tinnitus.  Practice techniques for reducing stress, such as meditation, yoga, or deep breathing. Work with your health care provider if you need help with managing stress.  Sleep with your head slightly raised. This may reduce the impact of tinnitus. General instructions  Do not use stimulants, such as nicotine, alcohol, or caffeine. Talk with your health care provider about other stimulants to avoid. Stimulants are substances that can make you feel alert and attentive by increasing certain activities in the body (such as heart rate and blood pressure). These substances may make tinnitus worse.  Take over-the-counter and prescription medicines only as told by your health care provider.  Try to get plenty of sleep each night.  Keep all follow-up visits as told by your health care provider. This is important. Contact a health care provider if:  Your tinnitus continues for 3 weeks or longer without stopping.  Your symptoms get worse or do not get better with home care.  You develop tinnitus after a head injury.  You have tinnitus along with any of the following: ? Dizziness. ? Loss of balance. ? Nausea and vomiting. Summary  Tinnitus refers to hearing a sound when there is no actual source for that sound. This is often described as ringing in the ears.  Symptoms may affect only one ear (unilateral) or both ears (bilateral).  Use a white noise machine, a humidifier, or other devices to mask the sound of tinnitus.  Do not use stimulants, such as nicotine, alcohol, or caffeine. Talk with your health care provider about other stimulants to avoid. These substances may make tinnitus worse. This information is not intended to replace advice given to you by your health care provider. Make sure you discuss any questions you have with your health care provider. Document Released:  06/10/2005 Document Revised: 05/23/2017 Document Reviewed: 03/20/2017 Elsevier Patient Education  2020 [employer].

## 2019-01-07 NOTE — Progress Notes (Signed)
Subjective  CC:  Chief Complaint  Patient presents with  . Tinnitus    Bilateral, started about 5-6 months ago and getting worse. Constant ringing worse at night.. Reports headaches and takes Tylenol with relief. Denies dizzness and nausea.    HPI: Alexandra Garner is a 58 y.o. female who presents to the office today to address the problems listed above in the chief complaint.  58 year old female with antiphospholipid syndrome, autoimmune disease on Plaquenil who presents for bilateral constant high-pitched tinnitus x6 to 7 months but worsening.  She denies hearing loss, vertigo or balance disturbance.  No history of barotrauma to ears.  She denies long history of loud noise exposure in her lifetime.  She thought it was related to Plaquenil and she and her rheumatologist have adjusted dosages without significant improvements.  She only otherwise takes aspirin.  Recently she also had been worked up for palpitations and vibratory sensation in her chest with cardiology.  That work-up was negative.  She denies stressors.  But she does note that she has had some diffuse mild hair thickening over the last several months.  No patchy hair loss or scalp lesions.  Last year, she had one mildly abnormal TSH with normal follow-up.  Health maintenance is currently up-to-date: Had GYN exam in March.  All screening is up-to-date. Assessment  1. Tinnitus of both ears   2. Hair thinning      Plan   Tinnitus, Bilateral: Reassuring exam and history.  Normal hearing screen.  Refer to ENT for further evaluation and recommendations.  Hair thinning with history of abnormal TSH, recheck today.  Health maintenance up-to-date  Follow up: Return in about 1 year (around 01/07/2020) for complete physical.  Visit date not found  Orders Placed This Encounter  Procedures  . TSH  . T3  . T4, free  . Ambulatory referral to ENT   No orders of the defined types were placed in this encounter.     I reviewed the  patients updated PMH, FH, and SocHx.    Patient Active Problem List   Diagnosis Date Noted  . Antiphospholipid syndrome ([place]) 11/14/2017    Priority: High  . Hormone replacement therapy (HRT) 11/14/2017    Priority: High  . Autoimmune disease ([place]) 10/20/2016    Priority: High  . High risk medication use 09/26/2016    Priority: High  . Palpitations 08/24/2018    Priority: Medium  . History of migraine 11/07/2017    Priority: Medium  . History of colonic polyps 11/01/2008    Priority: Medium  . Dyshidrotic eczema 11/14/2017    Priority: Low  . 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 Raynaud's disease without gangrene 09/19/2016    Priority: Low  . Primary osteoarthritis of both feet 09/19/2016    Priority: Low  . Family history of macular degeneration 02/03/2015    Priority: Low  . Primary localized osteoarthrosis of hand 12/23/2013    Priority: Low   Current Meds  Medication Sig  . aspirin 81 MG tablet Take 81 mg by mouth 2 (two) times daily.   Marland Kitchen estrogens, conjugated, (PREMARIN) 0.625 MG tablet Take 1 tablet (0.625 mg total) by mouth daily.  . hydroxychloroquine (PLAQUENIL) 200 MG tablet Take 1 tablet (200 mg total) by mouth daily.  . Multiple Vitamins-Minerals (PRESERVISION AREDS PO) Take by mouth.    Allergies: Patient is allergic to penicillins; adhesive [tape]; and latex. Family History: Patient family history includes Cervical cancer in her maternal  grandmother; Clotting disorder in her mother; Colon polyps in her father; Healthy in her daughter; Prostate cancer in her father. Social History:  Patient  reports that she has never smoked. She has never used smokeless tobacco. She reports current alcohol use of about 2.0 - 3.0 standard drinks of alcohol per week. She reports that she does not use drugs.  Review of Systems: Constitutional: Negative for fever malaise or anorexia Cardiovascular: negative for chest pain Respiratory: negative for SOB or  persistent cough Gastrointestinal: negative for abdominal pain  Objective  Vitals: BP 118/74   Pulse 66   Temp 98 F (36.7 C) (Oral)   Resp 14   Ht 5' 5.5" (1.664 m)   Wt 136 lb 3.2 oz (61.8 kg)   LMP 10/11/2012   SpO2 98%   BMI 22.32 kg/m  General: no acute distress , A&Ox3 HEENT: PEERL, conjunctiva normal, Oropharynx moist,neck is supple, bilateral TMs normal.  No cervical lymphadenopathy Cardiovascular:  RRR without murmur or gallop.  Respiratory:  Good breath sounds bilaterally, CTAB with normal respiratory effort Skin:  Warm, no rashes  Normal hearing screen   Commons side effects, risks, benefits, and alternatives for medications and treatment plan prescribed today were discussed, and the patient expressed understanding of the given instructions. Patient is instructed to call or message via MyChart if he/she has any questions or concerns regarding our treatment plan. No barriers to understanding were identified. We discussed Red Flag symptoms and signs in detail. Patient expressed understanding regarding what to do in case of urgent or emergency type symptoms.   Medication list was reconciled, printed and provided to the patient in AVS. Patient instructions and summary information was reviewed with the patient as documented in the AVS. This note was prepared with assistance of Dragon voice recognition software. Occasional wrong-word or sound-a-like substitutions may have occurred due to the inherent limitations of voice recognition software

## 2019-01-08 LAB — T3: T3, Total: 115 ng/dL (ref 76–181)

## 2019-01-15 ENCOUNTER — Ambulatory Visit: Payer: Self-pay | Admitting: Physician Assistant

## 2019-01-18 ENCOUNTER — Other Ambulatory Visit: Payer: Self-pay

## 2019-01-18 ENCOUNTER — Encounter: Payer: Self-pay | Admitting: Physician Assistant

## 2019-01-18 ENCOUNTER — Ambulatory Visit: Payer: 59 | Admitting: Physician Assistant

## 2019-01-18 VITALS — BP 125/70 | HR 59 | Resp 12 | Ht 66.0 in | Wt 140.8 lb

## 2019-01-18 DIAGNOSIS — R5383 Other fatigue: Secondary | ICD-10-CM

## 2019-01-18 DIAGNOSIS — M19041 Primary osteoarthritis, right hand: Secondary | ICD-10-CM

## 2019-01-18 DIAGNOSIS — Z8669 Personal history of other diseases of the nervous system and sense organs: Secondary | ICD-10-CM

## 2019-01-18 DIAGNOSIS — R760 Raised antibody titer: Secondary | ICD-10-CM

## 2019-01-18 DIAGNOSIS — Z79899 Other long term (current) drug therapy: Secondary | ICD-10-CM | POA: Diagnosis not present

## 2019-01-18 DIAGNOSIS — M7062 Trochanteric bursitis, left hip: Secondary | ICD-10-CM

## 2019-01-18 DIAGNOSIS — Z860100 Personal history of colon polyps, unspecified: Secondary | ICD-10-CM

## 2019-01-18 DIAGNOSIS — M359 Systemic involvement of connective tissue, unspecified: Secondary | ICD-10-CM | POA: Diagnosis not present

## 2019-01-18 DIAGNOSIS — M19071 Primary osteoarthritis, right ankle and foot: Secondary | ICD-10-CM

## 2019-01-18 DIAGNOSIS — M19072 Primary osteoarthritis, left ankle and foot: Secondary | ICD-10-CM

## 2019-01-18 DIAGNOSIS — M7061 Trochanteric bursitis, right hip: Secondary | ICD-10-CM

## 2019-01-18 DIAGNOSIS — I7300 Raynaud's syndrome without gangrene: Secondary | ICD-10-CM

## 2019-01-18 DIAGNOSIS — M19042 Primary osteoarthritis, left hand: Secondary | ICD-10-CM

## 2019-01-18 DIAGNOSIS — E559 Vitamin D deficiency, unspecified: Secondary | ICD-10-CM

## 2019-01-18 MED ORDER — HYDROXYCHLOROQUINE SULFATE 200 MG PO TABS: 200.0000 mg | ORAL_TABLET | Freq: Every day | ORAL | 0 refills | Status: DC

## 2019-01-19 NOTE — Progress Notes (Signed)
CBC WNL.  Alk phos is low.  Please check phosphorus level. Sed rate WNL.  UA normal. DsDNA is 1-no sign of a flare.

## 2019-01-20 LAB — CBC WITH DIFFERENTIAL/PLATELET
Absolute Monocytes: 340 cells/uL (ref 200–950)
Basophils Absolute: 41 cells/uL (ref 0–200)
Basophils Relative: 0.6 %
Eosinophils Absolute: 48 cells/uL (ref 15–500)
Eosinophils Relative: 0.7 %
HCT: 36.9 % (ref 35.0–45.0)
Hemoglobin: 12.5 g/dL (ref 11.7–15.5)
Lymphs Abs: 1367 cells/uL (ref 850–3900)
MCH: 29.2 pg (ref 27.0–33.0)
MCHC: 33.9 g/dL (ref 32.0–36.0)
MCV: 86.2 fL (ref 80.0–100.0)
MPV: 12 fL (ref 7.5–12.5)
Monocytes Relative: 5 %
Neutro Abs: 5005 cells/uL (ref 1500–7800)
Neutrophils Relative %: 73.6 %
Platelets: 258 10*3/uL (ref 140–400)
RBC: 4.28 10*6/uL (ref 3.80–5.10)
RDW: 12.4 % (ref 11.0–15.0)
Total Lymphocyte: 20.1 %
WBC: 6.8 10*3/uL (ref 3.8–10.8)

## 2019-01-20 LAB — PHOSPHORUS: Phosphorus: 3.2 mg/dL (ref 2.5–4.5)

## 2019-01-20 LAB — COMPLETE METABOLIC PANEL WITH GFR
AG Ratio: 1.6 (calc) (ref 1.0–2.5)
ALT: 14 U/L (ref 6–29)
AST: 20 U/L (ref 10–35)
Albumin: 4.2 g/dL (ref 3.6–5.1)
Alkaline phosphatase (APISO): 33 U/L — ABNORMAL LOW (ref 37–153)
BUN: 11 mg/dL (ref 7–25)
CO2: 29 mmol/L (ref 20–32)
Calcium: 9.8 mg/dL (ref 8.6–10.4)
Chloride: 104 mmol/L (ref 98–110)
Creat: 0.78 mg/dL (ref 0.50–1.05)
GFR, Est African American: 98 mL/min/{1.73_m2} (ref >=60)
GFR, Est Non African American: 85 mL/min/{1.73_m2} (ref >=60)
Globulin: 2.6 g/dL (calc) (ref 1.9–3.7)
Glucose, Bld: 100 mg/dL — ABNORMAL HIGH (ref 65–99)
Potassium: 4.3 mmol/L (ref 3.5–5.3)
Sodium: 138 mmol/L (ref 135–146)
Total Bilirubin: 0.5 mg/dL (ref 0.2–1.2)
Total Protein: 6.8 g/dL (ref 6.1–8.1)

## 2019-01-20 LAB — URINALYSIS, ROUTINE W REFLEX MICROSCOPIC
Bilirubin Urine: NEGATIVE
Glucose, UA: NEGATIVE
Hgb urine dipstick: NEGATIVE
Ketones, ur: NEGATIVE
Leukocytes,Ua: NEGATIVE
Nitrite: NEGATIVE
Protein, ur: NEGATIVE
Specific Gravity, Urine: 1.005 (ref 1.001–1.03)
pH: 6.5 (ref 5.0–8.0)

## 2019-01-20 LAB — ANTI-DNA ANTIBODY, DOUBLE-STRANDED: ds DNA Ab: 1 IU/mL

## 2019-01-20 LAB — SEDIMENTATION RATE: Sed Rate: 9 mm/h (ref 0–30)

## 2019-01-20 LAB — C3 AND C4
C3 Complement: 85 mg/dL (ref 83–193)
C4 Complement: 11 mg/dL — ABNORMAL LOW (ref 15–57)

## 2019-01-20 LAB — TEST AUTHORIZATION

## 2019-01-20 NOTE — Progress Notes (Signed)
Phosphorus is WNL. C4 mildly low but stable.  C3 WNL.  We will continue to monitor.  Please advise patient to notify us if she develops any new or worsening symptoms.

## 2019-03-18 ENCOUNTER — Encounter: Payer: Self-pay | Admitting: Family Medicine

## 2019-03-22 ENCOUNTER — Other Ambulatory Visit: Payer: Self-pay | Admitting: *Deleted

## 2019-03-22 DIAGNOSIS — Z1231 Encounter for screening mammogram for malignant neoplasm of breast: Secondary | ICD-10-CM

## 2019-04-15 ENCOUNTER — Other Ambulatory Visit: Payer: Self-pay

## 2019-04-15 ENCOUNTER — Ambulatory Visit (INDEPENDENT_AMBULATORY_CARE_PROVIDER_SITE_OTHER): Payer: 59

## 2019-04-15 DIAGNOSIS — Z23 Encounter for immunization: Secondary | ICD-10-CM | POA: Diagnosis not present

## 2019-04-15 NOTE — Progress Notes (Signed)
Patient in office for first shingles vaccine. VIS given. Patient informed will need to return in 2-6 months for second injection. Immunization tolerated no further questions at this time.

## 2019-06-03 NOTE — Progress Notes (Signed)
Virtual Visit via Video Note  I connected with Alexandra Garner on 06/07/19 at 10:30 AM EST by a video enabled telemedicine application and verified that I am speaking with the correct person using two identifiers.  Location: Patient: Home Provider: Clinic  This service was conducted via virtual visit.  Both audio and visual tools were used.  The patient was located at home. I was located in my office.  Consent was obtained prior to the virtual visit and is aware of possible charges through their insurance for this visit.  The patient is an established patient.  Dr. Estanislado Pandy, MD conducted the virtual visit and Hazel Sams, PA-C acted as scribe during the service.  Office staff helped with scheduling follow up visits after the service was conducted.   I discussed the limitations of evaluation and management by telemedicine and the availability of in person appointments. The patient expressed understanding and agreed to proceed.  CC: Lower back pain  History of Present Illness: Patient is a 58 year old female with a past medical history of autoimmune disease, DDD, and osteoarthritis.  She is taking plaquenil 200 mg 1 tablet by mouth daily.  She has not had any signs or symptoms of a flare recently.  She has intermittent symptoms of raynaud's with cooler weather temperatures.  She is taking aspirin 81 mg 2 tablets daily.  She has intermittent symptoms of trochanteric bursitis.  She has been experiencing increased neck and lower back pain. She had a MRI of the C-spine and lumbar spine in November 2020.   Review of Systems  Constitutional: Positive for malaise/fatigue. Negative for fever.  Eyes: Negative for photophobia, pain, discharge and redness.  Respiratory: Negative for cough, shortness of breath and wheezing.   Cardiovascular: Positive for palpitations. Negative for chest pain.  Gastrointestinal: Negative for blood in stool, constipation and diarrhea.  Genitourinary: Negative for dysuria.   Musculoskeletal: Positive for back pain and neck pain. Negative for joint pain and myalgias.  Skin: Negative for rash.       +Raynaud's  Neurological: Negative for dizziness and headaches.  Psychiatric/Behavioral: Negative for depression. The patient has insomnia. The patient is not nervous/anxious.       Observations/Objective: Physical Exam  Constitutional: She is oriented to person, place, and time and well-developed, well-nourished, and in no distress.  HENT:  Head: Normocephalic and atraumatic.  Eyes: Conjunctivae are normal.  Pulmonary/Chest: Effort normal.  Neurological: She is alert and oriented to person, place, and time.  Psychiatric: Mood, memory, affect and judgment normal.   Patient reports morning stiffness for 15 minutes.   Patient denies nocturnal pain.  Difficulty dressing/grooming: Denies Difficulty climbing stairs: Reports Difficulty getting out of chair: Denies Difficulty using hands for taps, buttons, cutlery, and/or writing: Denies    Record review May 12, 2019 MRI lumbar spine-L5-S1 disc bulge and mild to moderate neural foraminal narrowing.  L4-L5 posterior disc bulge facet joint arthropathy and moderate right neuroforaminal narrowing.  MRI cervical spine multilevel DDD and spondylosis with moderate neuroforaminal narrowing   Assessment and Plan: Visit Diagnoses: Autoimmune disease ([place]) - History of arthralgias, [name], photosensitivity, positive aCL IgM46, positive beta 2 100: She has not had any signs or symptoms of a flare recently.  She is clinically doing well on Plaquenil 200 mg 1 tablet by mouth daily.  She has not had any recent rashes or photosensitivity.  She has intermittent symptoms of Raynaud's but no digital ulcerations or signs of gangrene noted. She continues to take aspirin 162 mg  po daily. She has not had any oral or nasal ulcerations.  No sicca symptoms.  She continues to have intermittent palpations, but she states the holter  monitor did not reveal any abnormal findings in March 2020.  She has been having increased neck and lower back pain but has not had any joint inflammation recently.  She will continue taking plaquenil as prescribed.  We will check autoimmune lab work.  Orders will be placed today.  She was advised to notify us if she develops any new or worsening symptoms.  She will follow up in 4 months.   High risk medication use - PLQ 200 mg 1 tablet by mouth daily.  orders for CBC and CMP will be placed today. eye exam: 11/13/2018.    Raynaud's disease without gangrene - She has intermittent symptoms of Raynaud's with cooler weather changes.  She is taking ASA 162 mg po daily.    Primary osteoarthritis of both hands -She has intermittent pain in both feet.  No joint swelling.  Joint protection and muscle strengthening were discussed.   Primary osteoarthritis of both feet -She has no pain in her feet at this time.  No joint swelling.  Vitamin D deficiency: Vitamin D was 41 on 12/29/17.  We will check vitamin D today.  Antiphospholipid antibody positive - She takes aspirin 162 mg po daily. We will recheck antibodies today.    Trochanteric bursitis of both hips -She has intermittent symptoms of trochanteric bursitis bilaterally.  She walks and stretches for exercise on a regular basis.  DDD (degenerative disc disease), cervical: MRI cervical spine multilevel DDD and spondylosis with moderate neuroforaminal narrowing.  She has been experiencing intermittent neck pain and stiffness.    DDD (degenerative disc disease), lumbar: Record review May 12, 2019 MRI lumbar spine-L5-S1 disc bulge and mild to moderate neural foraminal narrowing.  L4-L5 posterior disc bulge facet joint arthropathy and moderate right neuroforaminal narrowing.    History of colonic polyps -She had a colonoscopy performed on 12/18/2018, which normal.  Repeat colonoscopy in 10 years.   Positive cardiolipin antibodies, positive b2 GP1 -  She takes aspirin 162 mg po daily.   Other fatigue - She has chronic fatigue which has been stable.   History of migraine   Follow Up Instructions: She will follow up in 4 months.    I discussed the assessment and treatment plan with the patient. The patient was provided an opportunity to ask questions and all were answered. The patient agreed with the plan and demonstrated an understanding of the instructions.   The patient was advised to call back or seek an in-person evaluation if the symptoms worsen or if the condition fails to improve as anticipated.  I provided 25 minutes of non-face-to-face time during this encounter.   Bo Merino, MD    Scribed by-   Hazel Sams, PA-C

## 2019-06-04 ENCOUNTER — Ambulatory Visit: Payer: 59

## 2019-06-07 ENCOUNTER — Encounter: Payer: Self-pay | Admitting: Rheumatology

## 2019-06-07 ENCOUNTER — Telehealth (INDEPENDENT_AMBULATORY_CARE_PROVIDER_SITE_OTHER): Payer: 59 | Admitting: Rheumatology

## 2019-06-07 ENCOUNTER — Other Ambulatory Visit: Payer: Self-pay

## 2019-06-07 DIAGNOSIS — R760 Raised antibody titer: Secondary | ICD-10-CM

## 2019-06-07 DIAGNOSIS — M19072 Primary osteoarthritis, left ankle and foot: Secondary | ICD-10-CM

## 2019-06-07 DIAGNOSIS — M359 Systemic involvement of connective tissue, unspecified: Secondary | ICD-10-CM

## 2019-06-07 DIAGNOSIS — M7061 Trochanteric bursitis, right hip: Secondary | ICD-10-CM

## 2019-06-07 DIAGNOSIS — M19041 Primary osteoarthritis, right hand: Secondary | ICD-10-CM | POA: Diagnosis not present

## 2019-06-07 DIAGNOSIS — M5136 Other intervertebral disc degeneration, lumbar region: Secondary | ICD-10-CM

## 2019-06-07 DIAGNOSIS — R5383 Other fatigue: Secondary | ICD-10-CM

## 2019-06-07 DIAGNOSIS — Z8669 Personal history of other diseases of the nervous system and sense organs: Secondary | ICD-10-CM

## 2019-06-07 DIAGNOSIS — M5030 Other cervical disc degeneration, unspecified cervical region: Secondary | ICD-10-CM

## 2019-06-07 DIAGNOSIS — M19042 Primary osteoarthritis, left hand: Secondary | ICD-10-CM

## 2019-06-07 DIAGNOSIS — I7300 Raynaud's syndrome without gangrene: Secondary | ICD-10-CM | POA: Diagnosis not present

## 2019-06-07 DIAGNOSIS — Z86010 Personal history of colonic polyps: Secondary | ICD-10-CM

## 2019-06-07 DIAGNOSIS — E559 Vitamin D deficiency, unspecified: Secondary | ICD-10-CM

## 2019-06-07 DIAGNOSIS — Z79899 Other long term (current) drug therapy: Secondary | ICD-10-CM

## 2019-06-07 DIAGNOSIS — M7062 Trochanteric bursitis, left hip: Secondary | ICD-10-CM

## 2019-06-07 DIAGNOSIS — M19071 Primary osteoarthritis, right ankle and foot: Secondary | ICD-10-CM

## 2019-06-08 ENCOUNTER — Encounter: Payer: Self-pay | Admitting: Rheumatology

## 2019-06-08 DIAGNOSIS — Z79899 Other long term (current) drug therapy: Secondary | ICD-10-CM

## 2019-06-08 DIAGNOSIS — E559 Vitamin D deficiency, unspecified: Secondary | ICD-10-CM

## 2019-06-08 DIAGNOSIS — M359 Systemic involvement of connective tissue, unspecified: Secondary | ICD-10-CM

## 2019-06-08 DIAGNOSIS — R760 Raised antibody titer: Secondary | ICD-10-CM

## 2019-06-08 DIAGNOSIS — R5383 Other fatigue: Secondary | ICD-10-CM

## 2019-06-08 NOTE — Addendum Note (Signed)
Addended by: Earnestine Mealing on: 06/08/2019 08:15 AM   Modules accepted: Orders

## 2019-06-08 NOTE — Addendum Note (Signed)
Addended by: Earnestine Mealing on: 06/08/2019 08:14 AM   Modules accepted: Orders

## 2019-06-09 ENCOUNTER — Encounter: Payer: Self-pay | Admitting: Family Medicine

## 2019-06-10 NOTE — Telephone Encounter (Signed)
Labs are stable.

## 2019-06-15 ENCOUNTER — Ambulatory Visit: Payer: 59

## 2019-06-15 ENCOUNTER — Telehealth: Payer: Self-pay

## 2019-06-15 NOTE — Telephone Encounter (Signed)
Patient called requesting lab results. I advised patient and she verbalized understanding. Patient questioned if she should be taking prescription strength vitamin D. I discussed with Dr. Estanislado Pandy and she recommended the patient take 2,000 units of vitamin D daily. Patient verbalized understanding.

## 2019-06-16 ENCOUNTER — Encounter: Payer: Self-pay | Admitting: Certified Nurse Midwife

## 2019-06-16 ENCOUNTER — Telehealth: Payer: Self-pay | Admitting: Obstetrics & Gynecology

## 2019-06-16 ENCOUNTER — Ambulatory Visit: Payer: 59 | Admitting: Certified Nurse Midwife

## 2019-06-16 ENCOUNTER — Other Ambulatory Visit: Payer: Self-pay

## 2019-06-16 VITALS — BP 112/72 | HR 68 | Temp 97.7°F | Resp 16 | Wt 138.0 lb

## 2019-06-16 DIAGNOSIS — N898 Other specified noninflammatory disorders of vagina: Secondary | ICD-10-CM

## 2019-06-16 DIAGNOSIS — N390 Urinary tract infection, site not specified: Secondary | ICD-10-CM | POA: Diagnosis not present

## 2019-06-16 LAB — POCT URINALYSIS DIPSTICK
Bilirubin, UA: NEGATIVE
Blood, UA: NEGATIVE
Glucose, UA: NEGATIVE
Ketones, UA: NEGATIVE
Nitrite, UA: NEGATIVE
Protein, UA: NEGATIVE
Urobilinogen, UA: NEGATIVE E.U./dL — AB
pH, UA: 5 (ref 5.0–8.0)

## 2019-06-16 NOTE — Patient Instructions (Signed)
Urinary Tract Infection, Adult A urinary tract infection (UTI) is an infection of any part of the urinary tract. The urinary tract includes:  The kidneys.  The ureters.  The bladder.  The urethra. These organs make, store, and get rid of pee (urine) in the body. What are the causes? This is caused by germs (bacteria) in your genital area. These germs grow and cause swelling (inflammation) of your urinary tract. What increases the risk? You are more likely to develop this condition if:  You have a small, thin tube (catheter) to drain pee.  You cannot control when you pee or poop (incontinence).  You are female, and: ? You use these methods to prevent pregnancy: ? A medicine that kills sperm (spermicide). ? A device that blocks sperm (diaphragm). ? You have low levels of a female hormone (estrogen). ? You are pregnant.  You have genes that add to your risk.  You are sexually active.  You take antibiotic medicines.  You have trouble peeing because of: ? A prostate that is bigger than normal, if you are female. ? A blockage in the part of your body that drains pee from the bladder (urethra). ? A kidney stone. ? A nerve condition that affects your bladder (neurogenic bladder). ? Not getting enough to drink. ? Not peeing often enough.  You have other conditions, such as: ? Diabetes. ? A weak disease-fighting system (immune system). ? Sickle cell disease. ? Gout. ? Injury of the spine. What are the signs or symptoms? Symptoms of this condition include:  Needing to pee right away (urgently).  Peeing often.  Peeing small amounts often.  Pain or burning when peeing.  Blood in the pee.  Pee that smells bad or not like normal.  Trouble peeing.  Pee that is cloudy.  Fluid coming from the vagina, if you are female.  Pain in the belly or lower back. Other symptoms include:  Throwing up (vomiting).  No urge to eat.  Feeling mixed up (confused).  Being tired  and grouchy (irritable).  A fever.  Watery poop (diarrhea). How is this treated? This condition may be treated with:  Antibiotic medicine.  Other medicines.  Drinking enough water. Follow these instructions at home:  Medicines  Take over-the-counter and prescription medicines only as told by your doctor.  If you were prescribed an antibiotic medicine, take it as told by your doctor. Do not stop taking it even if you start to feel better. General instructions  Make sure you: ? Pee until your bladder is empty. ? Do not hold pee for a long time. ? Empty your bladder after sex. ? Wipe from front to back after pooping if you are a female. Use each tissue one time when you wipe.  Drink enough fluid to keep your pee pale yellow.  Keep all follow-up visits as told by your doctor. This is important. Contact a doctor if:  You do not get better after 1-2 days.  Your symptoms go away and then come back. Get help right away if:  You have very bad back pain.  You have very bad pain in your lower belly.  You have a fever.  You are sick to your stomach (nauseous).  You are throwing up. Summary  A urinary tract infection (UTI) is an infection of any part of the urinary tract.  This condition is caused by germs in your genital area.  There are many risk factors for a UTI. These include having a small, thin   tube to drain pee and not being able to control when you pee or poop.  Treatment includes antibiotic medicines for germs.  Drink enough fluid to keep your pee pale yellow. This information is not intended to replace advice given to you by your health care provider. Make sure you discuss any questions you have with your health care provider. Document Released: 11/27/2007 Document Revised: 05/28/2018 Document Reviewed: 12/18/2017 Elsevier Patient Education  2020 [place]. Vaginal Yeast infection, Adult  Vaginal yeast infection is a condition that causes vaginal  discharge as well as soreness, swelling, and redness (inflammation) of the vagina. This is a common condition. Some women get this infection frequently. What are the causes? This condition is caused by a change in the normal balance of the yeast (candida) and bacteria that live in the vagina. This change causes an overgrowth of yeast, which causes the inflammation. What increases the risk? The condition is more likely to develop in women who:  Take antibiotic medicines.  Have diabetes.  Take birth control pills.  Are pregnant.  Douche often.  Have a weak body defense system (immune system).  Have been taking steroid medicines for a long time.  Frequently wear tight clothing. What are the signs or symptoms? Symptoms of this condition include:  White, thick, creamy vaginal discharge.  Swelling, itching, redness, and irritation of the vagina. The lips of the vagina (vulva) may be affected as well.  Pain or a burning feeling while urinating.  Pain during sex. How is this diagnosed? This condition is diagnosed based on:  Your medical history.  A physical exam.  A pelvic exam. Your health care provider will examine a sample of your vaginal discharge under a microscope. Your health care provider may send this sample for testing to confirm the diagnosis. How is this treated? This condition is treated with medicine. Medicines may be over-the-counter or prescription. You may be told to use one or more of the following:  Medicine that is taken by mouth (orally).  Medicine that is applied as a cream (topically).  Medicine that is inserted directly into the vagina (suppository). Follow these instructions at home:  Lifestyle  Do not have sex until your health care provider approves. Tell your sex partner that you have a yeast infection. That person should go to his or her health care provider and ask if they should also be treated.  Do not wear tight clothes, such as pantyhose  or tight pants.  Wear breathable cotton underwear. General instructions  Take or apply over-the-counter and prescription medicines only as told by your health care provider.  Eat more yogurt. This may help to keep your yeast infection from returning.  Do not use tampons until your health care provider approves.  Try taking a sitz bath to help with discomfort. This is a warm water bath that is taken while you are sitting down. The water should only come up to your hips and should cover your buttocks. Do this 3-4 times per day or as told by your health care provider.  Do not douche.  If you have diabetes, keep your blood sugar levels under control.  Keep all follow-up visits as told by your health care provider. This is important. Contact a health care provider if:  You have a fever.  Your symptoms go away and then return.  Your symptoms do not get better with treatment.  Your symptoms get worse.  You have new symptoms.  You develop blisters in or around your  vagina.  You have blood coming from your vagina and it is not your menstrual period.  You develop pain in your abdomen. Summary  Vaginal yeast infection is a condition that causes discharge as well as soreness, swelling, and redness (inflammation) of the vagina.  This condition is treated with medicine. Medicines may be over-the-counter or prescription.  Take or apply over-the-counter and prescription medicines only as told by your health care provider.  Do not douche. Do not have sex or use tampons until your health care provider approves.  Contact a health care provider if your symptoms do not get better with treatment or your symptoms go away and then return. This information is not intended to replace advice given to you by your health care provider. Make sure you discuss any questions you have with your health care provider. Document Released: 03/20/2005 Document Revised: 10/27/2017 Document Reviewed:  10/27/2017 Elsevier Patient Education  [place].

## 2019-06-16 NOTE — Telephone Encounter (Signed)
Patient states blood work and urinalysis done at rheumatologist last week and urinalysis showed uti. Results in mychart. Patient states she was told to call us for prescription.

## 2019-06-16 NOTE — Telephone Encounter (Signed)
Spoke with patient. Patient states she was seen by Rheumatology last week and had a positive UA, f/u with GYN for tx. Patient reports bloating and lower abdominal discomfort and vaginal discharge for the past 1 -1.5wks. Patient is requesting Rx to tx UTI.   Advised patient per review of [place] results in [place] dated 06/08/19, trace leucocytes, no urine culture that indicates UTI. Advised OV needed for further evaluation. Patient is requesting OV today due to the upcoming holiday, is agreeable to see any available provider. [id number] prescreen negative, precautions reviewed.   OV scheduled for today at 2:30pm with Melvia Heaps, CNM.  Routing to provider for final review. Patient is agreeable to disposition. Will close encounter.

## 2019-06-16 NOTE — Progress Notes (Signed)
58 y.o. Married Caucasian female G1P1 here with complaint of vaginal symptoms of itching, burning, and increase discharge. Describes discharge as white and thicker than normal. Onset of symptoms 7-14 days ago. Denies new personal products or vaginal dryness.  Urinary symptoms occasional frequency.  Review of Systems  Constitutional: Negative.   HENT: Negative.   Eyes: Negative.   Respiratory: Negative.   Cardiovascular: Negative.   Gastrointestinal: Negative.   Genitourinary: Positive for frequency.       Slight frequency Increase vaginal discharge no odor  Musculoskeletal: Negative.   Skin: Negative.   Neurological: Negative.   Endo/Heme/Allergies: Negative.   Psychiatric/Behavioral: Negative.     O:Healthy female WDWN Affect: normal, orientation x 3  poct urine-wbc 1+ Exam: Skin: warm and dry CVAT: bilateral negative Abdomen:soft, non tender, negative suprapubic   Inguinal Lymph nodes: no enlargement or tenderness Pelvic exam: External genital: normal female BUS: negative Vagina: white non odorous discharge noted.   Affirm taken Cervix: absent Uterus:absent Adnexa: no masses or fullness noted  A:Normal pelvic exam R/O vaginal infection R/O UTI   P:Discussed findings of white discharge and suspect yeast etiology. Discussed Aveeno or baking soda sitz bath for comfort. Avoid moist clothes or pads for extended period of time. Questions addressed. Lab: Affirm  Discussed warning signs of UTI and need to advise if occurs. Increase water intake and decrease soda intake. Lab: urine micro, culture

## 2019-06-17 ENCOUNTER — Other Ambulatory Visit: Payer: Self-pay | Admitting: Certified Nurse Midwife

## 2019-06-17 ENCOUNTER — Ambulatory Visit (INDEPENDENT_AMBULATORY_CARE_PROVIDER_SITE_OTHER): Payer: 59

## 2019-06-17 DIAGNOSIS — B373 Candidiasis of vulva and vagina: Secondary | ICD-10-CM

## 2019-06-17 DIAGNOSIS — B3731 Acute candidiasis of vulva and vagina: Secondary | ICD-10-CM

## 2019-06-17 DIAGNOSIS — Z23 Encounter for immunization: Secondary | ICD-10-CM

## 2019-06-17 LAB — BETA-2-GLYCOPROTEIN I ABS, IGG/M/A
Beta-2 Glyco 1 IgA: <9 GPI IgA units (ref 0–25)
Beta-2 Glyco 1 IgM: 34 GPI IgM units — ABNORMAL HIGH (ref 0–32)
Beta-2 Glyco I IgG: <9 GPI IgG units (ref 0–20)

## 2019-06-17 LAB — MICROSCOPIC EXAMINATION: Casts: NONE SEEN /lpf

## 2019-06-17 LAB — CBC WITH DIFFERENTIAL/PLATELET
Basophils Absolute: 0.1 10*3/uL (ref 0.0–0.2)
Basos: 1 %
EOS (ABSOLUTE): 0.1 10*3/uL (ref 0.0–0.4)
Eos: 2 %
Hematocrit: 38 % (ref 34.0–46.6)
Hemoglobin: 12.9 g/dL (ref 11.1–15.9)
Immature Grans (Abs): 0 10*3/uL (ref 0.0–0.1)
Immature Granulocytes: 0 %
Lymphocytes Absolute: 2.1 10*3/uL (ref 0.7–3.1)
Lymphs: 25 %
MCH: 28.9 pg (ref 26.6–33.0)
MCHC: 33.9 g/dL (ref 31.5–35.7)
MCV: 85 fL (ref 79–97)
Monocytes Absolute: 0.5 10*3/uL (ref 0.1–0.9)
Monocytes: 6 %
Neutrophils Absolute: 5.5 10*3/uL (ref 1.4–7.0)
Neutrophils: 66 %
Platelets: 296 10*3/uL (ref 150–450)
RBC: 4.47 x10E6/uL (ref 3.77–5.28)
RDW: 12.5 % (ref 11.7–15.4)
WBC: 8.2 10*3/uL (ref 3.4–10.8)

## 2019-06-17 LAB — CMP14+EGFR
ALT: 13 IU/L (ref 0–32)
AST: 17 IU/L (ref 0–40)
Albumin/Globulin Ratio: 2 (ref 1.2–2.2)
Albumin: 4.9 g/dL (ref 3.8–4.9)
Alkaline Phosphatase: 48 IU/L (ref 39–117)
BUN/Creatinine Ratio: 18 (ref 9–23)
BUN: 13 mg/dL (ref 6–24)
Bilirubin Total: 0.4 mg/dL (ref 0.0–1.2)
CO2: 22 mmol/L (ref 20–29)
Calcium: 9.7 mg/dL (ref 8.7–10.2)
Chloride: 101 mmol/L (ref 96–106)
Creatinine, Ser: 0.74 mg/dL (ref 0.57–1.00)
GFR calc Af Amer: 105 mL/min/{1.73_m2} (ref >59)
GFR calc non Af Amer: 91 mL/min/{1.73_m2} (ref >59)
Globulin, Total: 2.5 g/dL (ref 1.5–4.5)
Glucose: 91 mg/dL (ref 65–99)
Potassium: 3.5 mmol/L (ref 3.5–5.2)
Sodium: 140 mmol/L (ref 134–144)
Total Protein: 7.4 g/dL (ref 6.0–8.5)

## 2019-06-17 LAB — LUPUS ANTICOAGULANT PANEL
APTT: 27 s
Anticardiolipin Ab, IgG: <10 [GPL'U]
Anticardiolipin Ab, IgM: 67 [MPL'U] — ABNORMAL HIGH
Beta-2 Glycoprotein I, IgA: <10 SAU
Beta-2 Glycoprotein I, IgG: <10 SGU
Beta-2 Glycoprotein I, IgM: 44 SMU — ABNORMAL HIGH
DRVVT Screen Seconds: 34.3 s
Hexagonal Phospholipid Neutral: 8 s
INR: 0.9 ratio
Platelet Neutralization: 0 s
Prothrombin Time: 10.1 s
Thrombin Time: 17.1 s

## 2019-06-17 LAB — URINALYSIS, MICROSCOPIC ONLY: Casts: NONE SEEN /lpf

## 2019-06-17 LAB — VITAMIN B12: Vitamin B-12: 824 pg/mL (ref 232–1245)

## 2019-06-17 LAB — URINALYSIS, ROUTINE W REFLEX MICROSCOPIC
Bilirubin, UA: NEGATIVE
Glucose, UA: NEGATIVE
Ketones, UA: NEGATIVE
Nitrite, UA: NEGATIVE
Protein,UA: NEGATIVE
RBC, UA: NEGATIVE
Specific Gravity, UA: >=1.03 — AB (ref 1.005–1.030)
Urobilinogen, Ur: 0.2 mg/dL (ref 0.2–1.0)
pH, UA: 5 (ref 5.0–7.5)

## 2019-06-17 LAB — VAGINITIS/VAGINOSIS, DNA PROBE
Candida Species: POSITIVE — AB
Gardnerella vaginalis: NEGATIVE
Trichomonas vaginosis: NEGATIVE

## 2019-06-17 LAB — SEDIMENTATION RATE: Sed Rate: 6 mm/hr (ref 0–40)

## 2019-06-17 LAB — ANTI-DNA ANTIBODY, DOUBLE-STRANDED: dsDNA Ab: 1 IU/mL (ref 0–9)

## 2019-06-17 LAB — CARDIOLIPIN ANTIBODIES, IGG, IGM, IGA
Anticardiolipin IgA: <9 APL U/mL (ref 0–11)
Anticardiolipin IgG: <9 GPL U/mL (ref 0–14)
Anticardiolipin IgM: 60 MPL U/mL — ABNORMAL HIGH (ref 0–12)

## 2019-06-17 LAB — C3 AND C4
Complement C3, Serum: 100 mg/dL (ref 82–167)
Complement C4, Serum: 12 mg/dL (ref 12–38)

## 2019-06-17 LAB — VITAMIN D 25 HYDROXY (VIT D DEFICIENCY, FRACTURES): Vit D, 25-Hydroxy: 28.9 ng/mL — ABNORMAL LOW (ref 30.0–100.0)

## 2019-06-17 MED ORDER — FLUCONAZOLE 150 MG PO TABS: ORAL_TABLET | ORAL | 0 refills | Status: DC

## 2019-06-17 NOTE — Progress Notes (Signed)
Per orders of Dr. Jonni Sanger, injection of her second Shingrix given by Serita Sheller in left deltoid. Patient tolerated injection well. Patient completed her series of Shingles injections.

## 2019-06-19 LAB — URINE CULTURE

## 2019-06-21 ENCOUNTER — Ambulatory Visit: Payer: 59 | Admitting: Physician Assistant

## 2019-06-21 NOTE — Telephone Encounter (Signed)
Lupus anticoagulant is negative.  Anticardiolipin IgM and beta-2 IgM are both positive and  stable.  She should continue Plaquenil and aspirin.

## 2019-08-04 ENCOUNTER — Encounter: Payer: Self-pay | Admitting: Family Medicine

## 2019-08-04 ENCOUNTER — Ambulatory Visit (INDEPENDENT_AMBULATORY_CARE_PROVIDER_SITE_OTHER): Payer: 59 | Admitting: Family Medicine

## 2019-08-04 ENCOUNTER — Other Ambulatory Visit: Payer: Self-pay

## 2019-08-04 VITALS — BP 138/86 | HR 76 | Temp 97.8°F | Ht 66.0 in | Wt 137.6 lb

## 2019-08-04 DIAGNOSIS — R2242 Localized swelling, mass and lump, left lower limb: Secondary | ICD-10-CM | POA: Diagnosis not present

## 2019-08-04 NOTE — Patient Instructions (Addendum)
Please follow up as scheduled for your next visit with me: 01/10/2020 for your physical.   I've placed a referral a sports medicine colleague, Dr. Georgina Snell; I'm hoping he will ultarsound your hip in the office so you may know that this is a benign finding. We will call you with an appointment.   Take care!  If you have any questions or concerns, please don't hesitate to send me a message via MyChart or call the office at [phone]. Thank you for visiting with [place] today! It's our pleasure caring for you.

## 2019-08-04 NOTE — Progress Notes (Signed)
Subjective  CC:  Chief Complaint  Patient presents with  . Mass    mass on left leg. notices when laying on it. no pain. noticed about 4 months ago. no growth since then    HPI: Alexandra Garner is a 59 y.o. female who presents to the office today to address the problems listed above in the chief complaint.  59 yo with new "mass" on left hip. Noticed about 4-6 months ago while exercising. Can feel mobile mass, nontender when doing side planks. Can palpate it herself. Has not grown or changed over time but persists. Denies pain, restricted movements or leg pain, swelling. Denies joint pain. No injury.  Feels well. On meds for autoimmune disorder. No h/o lipomas. No Fh of bone or muscle sarcomas.   Assessment  1. Mass of left hip region      Plan   Mass, left hip:  Suspect lipoma; refer to SM: hoping he can do in office ultrasound and confirm lipomatous mass and ensure it is not on tendon.   Follow up: refer to Dr. Georgina Snell, [facility]  01/10/2020 with me for cpe  Orders Placed This Encounter  Procedures  . Ambulatory referral to Sports Medicine   No orders of the defined types were placed in this encounter.     I reviewed the patients updated PMH, FH, and SocHx.    Patient Active Problem List   Diagnosis Date Noted  . Antiphospholipid syndrome ([place]) 11/14/2017    Priority: High  . Hormone replacement therapy (HRT) 11/14/2017    Priority: High  . Autoimmune disease ([place]) 10/20/2016    Priority: High  . High risk medication use 09/26/2016    Priority: High  . Palpitations 08/24/2018    Priority: Medium  . History of migraine 11/07/2017    Priority: Medium  . History of colonic polyps 11/01/2008    Priority: Medium  . Dyshidrotic eczema 11/14/2017    Priority: Low  . 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 Raynaud's disease without gangrene 09/19/2016    Priority: Low  . Primary osteoarthritis of both feet 09/19/2016    Priority: Low  . Family history of macular  degeneration 02/03/2015    Priority: Low  . Primary localized osteoarthrosis of hand 12/23/2013    Priority: Low   Current Meds  Medication Sig  . aspirin 81 MG tablet Take 81 mg by mouth 2 (two) times daily.   Marland Kitchen estrogens, conjugated, (PREMARIN) 0.625 MG tablet Take 1 tablet (0.625 mg total) by mouth daily.  . hydroxychloroquine (PLAQUENIL) 200 MG tablet Take 1 tablet (200 mg total) by mouth daily.  . Multiple Vitamins-Minerals (PRESERVISION AREDS PO) Take by mouth.    Allergies: Patient is allergic to penicillins; adhesive [tape]; and latex. Family History: Patient family history includes Cervical cancer in her maternal grandmother; Clotting disorder in her mother; Colon polyps in her father; Healthy in her daughter; Prostate cancer in her father. Social History:  Patient  reports that she has never smoked. She has never used smokeless tobacco. She reports current alcohol use of about 2.0 - 3.0 standard drinks of alcohol per week. She reports that she does not use drugs.  Review of Systems: Constitutional: Negative for fever malaise or anorexia Cardiovascular: negative for chest pain Respiratory: negative for SOB or persistent cough Gastrointestinal: negative for abdominal pain  Objective  Vitals: BP 138/86 (BP Location: Left Arm, Patient Position: Sitting, Cuff Size: Normal)   Pulse 76   Temp 97.8 F (  36.6 C) (Temporal)   Ht 5\' 6"  (1.676 m)   Wt 137 lb 9.6 oz (62.4 kg)   LMP 10/11/2012   SpO2 98%   BMI 22.21 kg/m  General: no acute distress , A&Ox3 MSK: left upper leg: 3-4cm mobile nontender mass over ischial tuberosity.      Commons side effects, risks, benefits, and alternatives for medications and treatment plan prescribed today were discussed, and the patient expressed understanding of the given instructions. Patient is instructed to call or message via MyChart if he/she has any questions or concerns regarding our treatment plan. No barriers to understanding were  identified. We discussed Red Flag symptoms and signs in detail. Patient expressed understanding regarding what to do in case of urgent or emergency type symptoms.   Medication list was reconciled, printed and provided to the patient in AVS. Patient instructions and summary information was reviewed with the patient as documented in the AVS. This note was prepared with assistance of Dragon voice recognition software. Occasional wrong-word or sound-a-like substitutions may have occurred due to the inherent limitations of voice recognition software  This visit occurred during the SARS-CoV-2 public health emergency.  Safety protocols were in place, including screening questions prior to the visit, additional usage of staff PPE, and extensive cleaning of exam room while observing appropriate contact time as indicated for disinfecting solutions.

## 2019-08-05 ENCOUNTER — Ambulatory Visit: Payer: Self-pay

## 2019-08-05 ENCOUNTER — Encounter: Payer: Self-pay | Admitting: Family Medicine

## 2019-08-05 ENCOUNTER — Ambulatory Visit (INDEPENDENT_AMBULATORY_CARE_PROVIDER_SITE_OTHER): Payer: 59

## 2019-08-05 ENCOUNTER — Ambulatory Visit: Payer: 59 | Admitting: Family Medicine

## 2019-08-05 VITALS — BP 130/86 | HR 63 | Ht 66.0 in | Wt 138.0 lb

## 2019-08-05 DIAGNOSIS — M25552 Pain in left hip: Secondary | ICD-10-CM

## 2019-08-05 DIAGNOSIS — R1907 Generalized intra-abdominal and pelvic swelling, mass and lump: Secondary | ICD-10-CM

## 2019-08-05 DIAGNOSIS — R2242 Localized swelling, mass and lump, left lower limb: Secondary | ICD-10-CM

## 2019-08-05 IMAGING — DX DG HIP (WITH OR WITHOUT PELVIS) 2-3V*L*
2 series · 2 of 2 positions shown · non-contrast
Comparison: Hip series [DATE].

CLINICAL DATA: 53-year-old female with palpable abnormality at the
lateral thigh for 6 months.

EXAM:
DG HIP (WITH OR WITHOUT PELVIS) 2-3V LEFT

[pelvis ap]
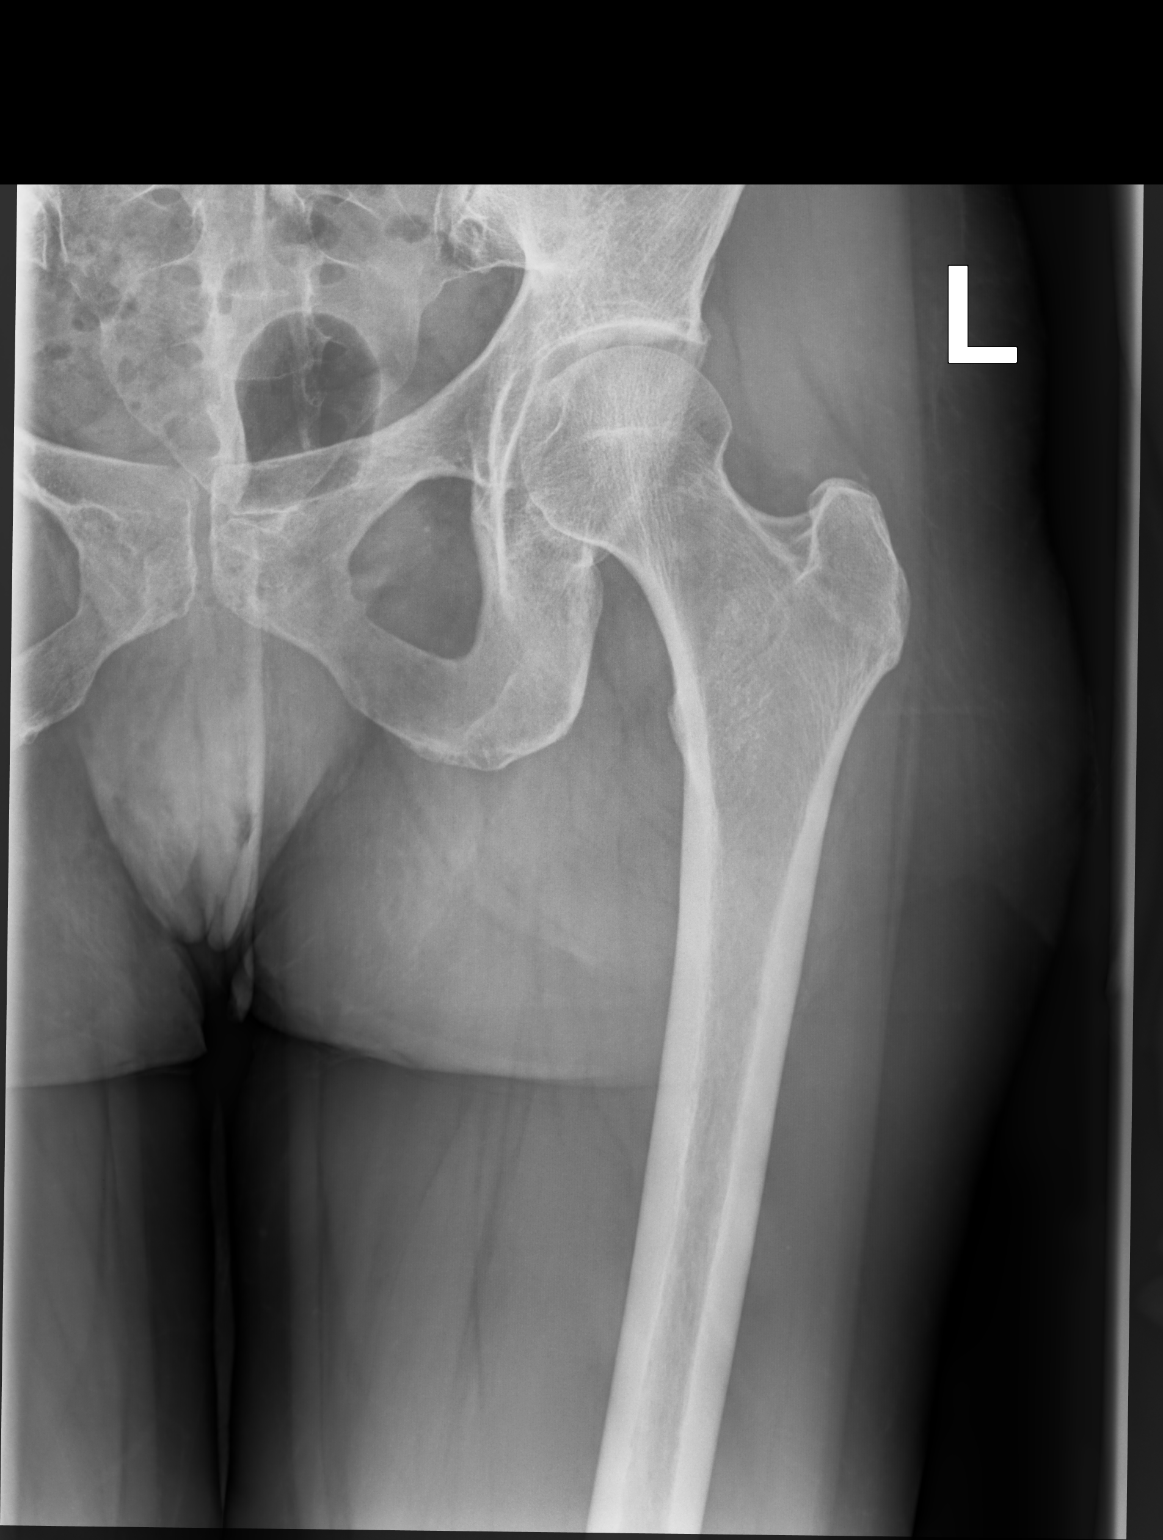

[hip ap]
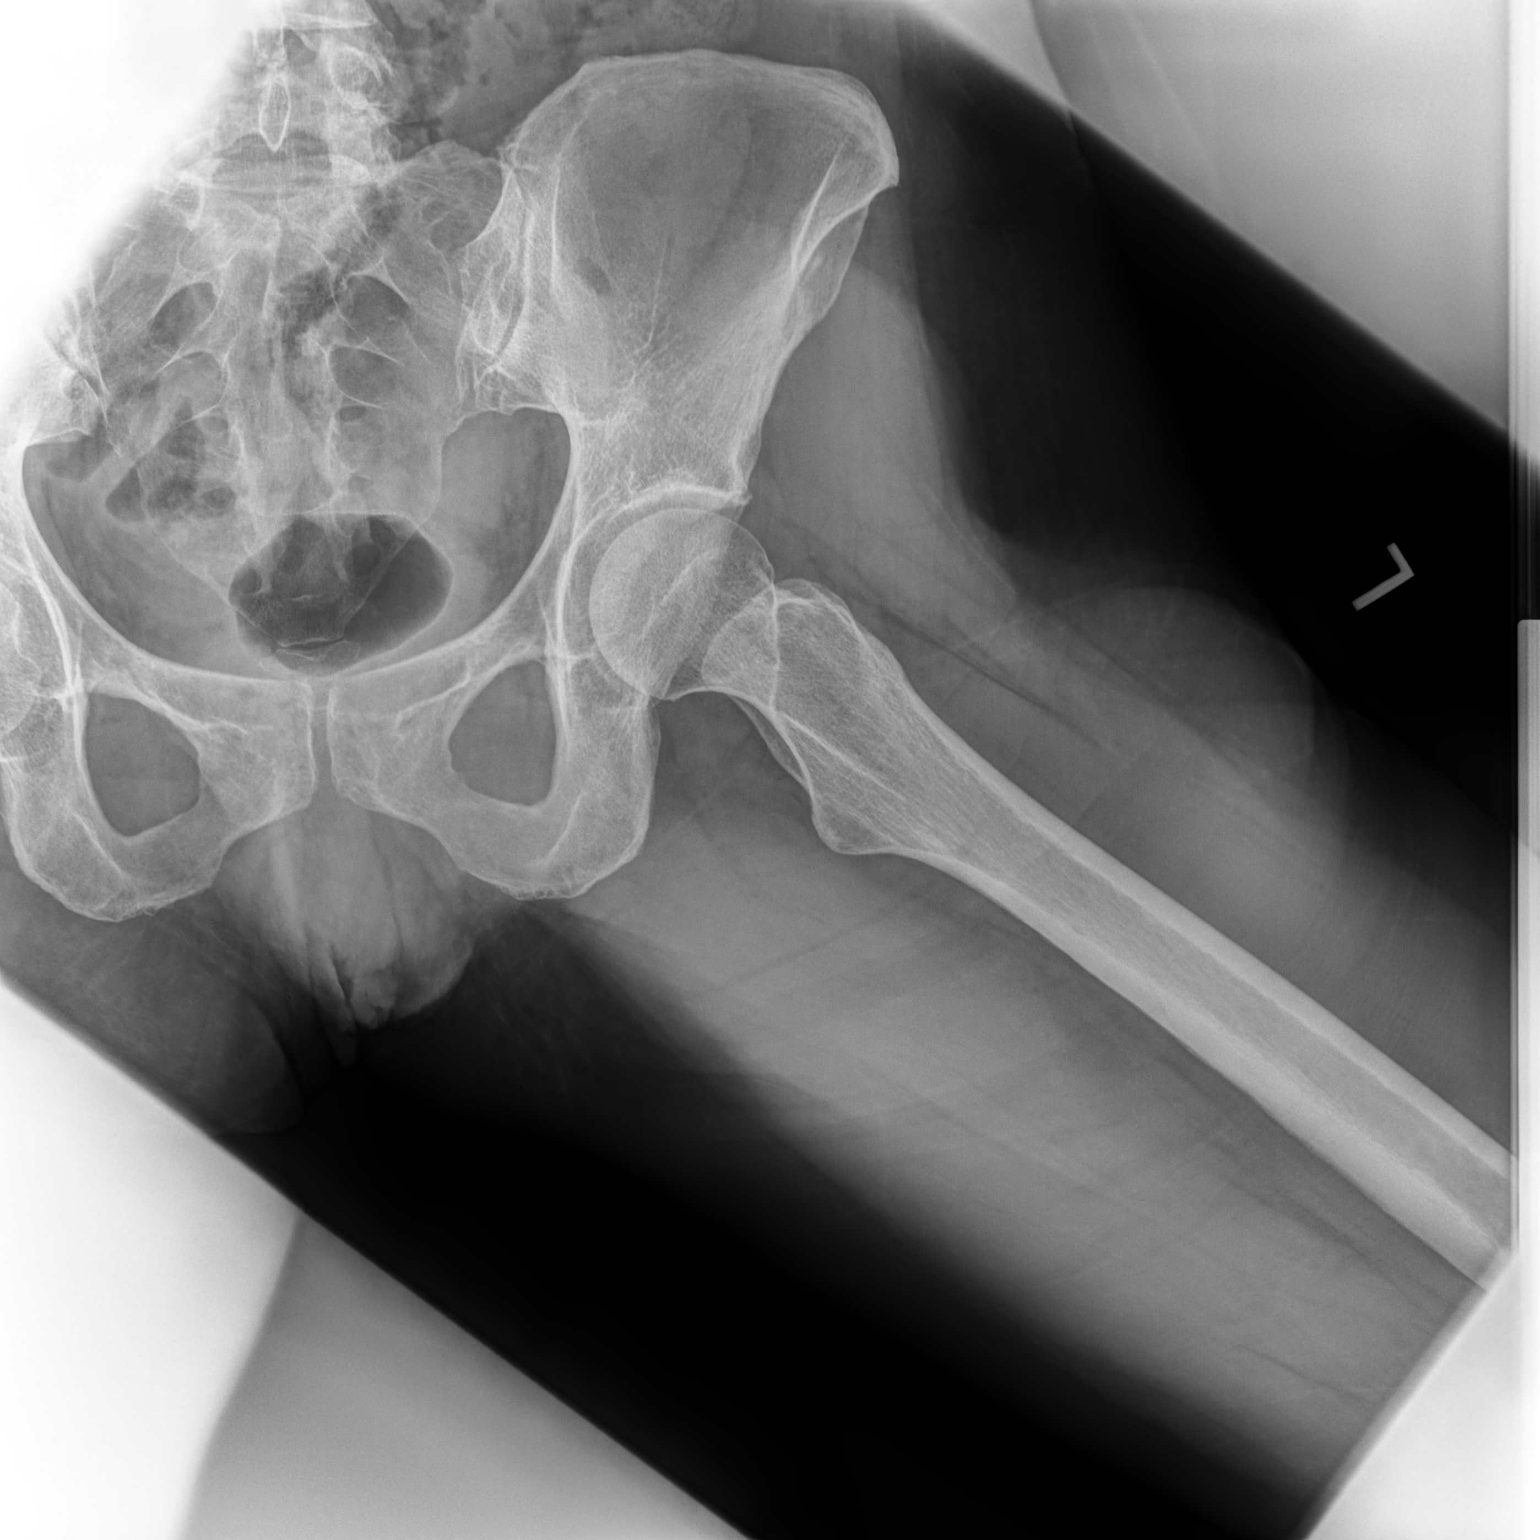

[2 of 2 positions shown; findings below may reference images not displayed]

FINDINGS: Left femoral head remains normally located. Proximal left femur is
stable and intact. Bone mineralization is within normal limits.
Stable and negative visible pelvis. Negative pelvic visceral
contours.

In the lateral proximal left femur there does appear to be a rounded
soft tissue contour which may be abnormal (arrow) No radiopaque
foreign body identified. No soft tissue gas.
IMPRESSION: 1. Questionable abnormal rounded soft tissue contour of the left
upper thigh with no radiopaque foreign body or other radiographic
soft tissue abnormality.
This is nonspecific, and cross-sectional imaging is recommended to
further characterize (Ultrasound would be simplest but lacks
specificity. MRI would have the greatest sensitivity and
specificity, with CT intermediate).
2. No underlying osseous abnormality.

## 2019-08-05 NOTE — Progress Notes (Signed)
Subjective:    I'm seeing this patient as a consultation for:  Dr. Jonni Sanger. Note will be routed back to referring provider/PCP.  CC: L hip mass  I, Alexandra Garner, LAT, ATC, am serving as scribe for Dr. Lynne Leader.  HPI: Pt is a 59 y/o female presenting w/ c/o L lateral hip mass x 4-6 months.  Pt denies any associated pain w/ this mass and states that it has not changed in size since she first noticed it.  She denies any swelling or redness to the affected area.  She states that she fell in the shower in 2019 and thinks that she hit her L hip/side during that fall but that's the only thing she can think of that has happened injury wise  Aggravating factors that make the area feel uncomfortable include L sidelying, sitting on a hard chair, climbing stairs, lunges.  Past medical history, Surgical history, Family history, Social history, Allergies, and medications have been entered into the medical record, reviewed.   Review of Systems: No new headache, visual changes, nausea, vomiting, diarrhea, constipation, dizziness, abdominal pain, skin rash, fevers, chills, night sweats, weight loss, swollen lymph nodes, body aches, joint swelling, muscle aches, chest pain, shortness of breath, mood changes, visual or auditory hallucinations.   Objective:    Vitals:   08/05/19 1415  BP: 130/86  Pulse: 63  SpO2: 99%   General: Well Developed, well nourished, and in no acute distress.  Neuro/Psych: Alert and oriented x3, extra-ocular muscles intact, able to move all 4 extremities, sensation grossly intact. Skin: Warm and dry, no rashes noted.  Respiratory: Not using accessory muscles, speaking in full sentences, trachea midline.  Cardiovascular: Pulses palpable, no extremity edema. Abdomen: Does not appear distended. MSK:  Left hip normal-appearing mass not entirely visible to inspection. Normal motion. Solid mass palpated medial to greater trochanter and gluteus.  Nontender.   Normal hip  strength. Normal gait.  Lab and Radiology Results  Diagnostic Limited MSK Ultrasound of: Left lateral hip Circumscribed circular cystic/solid mass arising from muscle tissue gluteus measuring 3.5 x 4.5 cm.  No increased Doppler activity within mass. Normal bony structure mass does not appear to arise from bone.  Impression: Soft tissue mass.  Not lipoma.  X-ray images left hip obtained today personally independently reviewed Normal-appearing left hip and femur.  No appreciable masses on x-ray. Await formal radiology review   Impression and Recommendations:    Assessment and Plan: 59 y.o. female with left lateral hip mass ongoing for approximately 6 months. Ultrasound examination today shows a solid/cystic mass arising from what appears to be the muscle tissue over gluteus.  This is not a lipoma and would benefit from further evaluation.  Plan for MRI of the left hip with and without contrast to evaluate for malignancy, benign tumor etc.  We will follow-up after MRI to discuss findings in further detail..   Orders Placed This Encounter  Procedures  . [place] LIMITED JOINT SPACE STRUCTURES LOW LEFT(NO LINKED CHARGES)    Order Specific Question:   Reason for Exam (SYMPTOM  OR DIAGNOSIS REQUIRED)    Answer:   L hip pain    Order Specific Question:   Preferred imaging location?    Answer:   [place]  . DG HIP UNILAT WITH PELVIS 2-3 VIEWS LEFT    Standing Status:   Future    Number of Occurrences:   1    Standing Expiration Date:   10/02/2020    Order Specific  Question:   Reason for Exam (SYMPTOM  OR DIAGNOSIS REQUIRED)    Answer:   eval left hip mass    Order Specific Question:   Is patient pregnant?    Answer:   No    Order Specific Question:   Preferred imaging location?    Answer:   [facility]    Order Specific Question:   Radiology Contrast Protocol - do NOT remove file path    Answer:   \\[email or web address]   . MR HIP LEFT W WO CONTRAST    Standing Status:   Future    Standing Expiration Date:   10/02/2020    Order Specific Question:   ** REASON FOR EXAM (FREE TEXT)    Answer:   Left gluetus hip mass. Solid and cystic seen on [place].    Order Specific Question:   If indicated for the ordered procedure, I authorize the administration of contrast media per Radiology protocol    Answer:   Yes    Order Specific Question:   What is the patient's sedation requirement?    Answer:   No Sedation    Order Specific Question:   Does the patient have a pacemaker or implanted devices?    Answer:   No    Order Specific Question:   Radiology Contrast Protocol - do NOT remove file path    Answer:   \\[email or web address]    Order Specific Question:   Preferred imaging location?    Answer:   [facility] (table limit-350lbs)   No orders of the defined types were placed in this encounter.   Discussed warning signs or symptoms. Please see discharge instructions. Patient expresses understanding.   The above documentation has been reviewed and is accurate and complete Lynne Leader

## 2019-08-05 NOTE — Patient Instructions (Signed)
Thank you for coming in today. Get xray today on your way out.  I will look at the xray myself today and get the report to you ASAP.  Plan for MRI at [facility] likely next Monday.  They will call you to schedule once it is approved.  If you do not hear anything mid week next week let me know.  Phone number to radiology in [place] is [phone] I will get the report to you ASAP from the MRI.  Recommend scheduling follow up a day or two after the MRI.

## 2019-08-06 NOTE — Progress Notes (Signed)
Xray hip does not show any bone changes. They do see the soft tissue swelling and recommend MRI.

## 2019-08-10 ENCOUNTER — Encounter: Payer: Self-pay | Admitting: Family Medicine

## 2019-08-10 NOTE — Progress Notes (Signed)
Received authorization for MRI from [place] [employer]. Notification number [id number]. MRI off valid for 45 days expiring September 20, 2019.  Message sent to Doralee Albino at [facility] radiology.

## 2019-08-16 ENCOUNTER — Telehealth: Payer: Self-pay | Admitting: Rheumatology

## 2019-08-16 ENCOUNTER — Encounter: Payer: Self-pay | Admitting: Family Medicine

## 2019-08-16 ENCOUNTER — Telehealth: Payer: Self-pay | Admitting: Family Medicine

## 2019-08-16 ENCOUNTER — Other Ambulatory Visit: Payer: Self-pay

## 2019-08-16 ENCOUNTER — Ambulatory Visit (INDEPENDENT_AMBULATORY_CARE_PROVIDER_SITE_OTHER): Payer: 59

## 2019-08-16 DIAGNOSIS — R2242 Localized swelling, mass and lump, left lower limb: Secondary | ICD-10-CM

## 2019-08-16 DIAGNOSIS — R1907 Generalized intra-abdominal and pelvic swelling, mass and lump: Secondary | ICD-10-CM

## 2019-08-16 IMAGING — MR MR HIP*L* WO/W CM
12 series · 40 of 40 positions shown · IV contrast (gadavist)
Comparison: Ultrasound dated [DATE]

CLINICAL DATA: Soft tissue mass of the left gluteus maximus muscle.

EXAM:
MRI OF THE LEFT HIP WITHOUT AND WITH CONTRAST
TECHNIQUE: Multiplanar, multisequence MR imaging was performed both before and
after administration of intravenous contrast.
CONTRAST:  7.5mL GADAVIST GADOBUTROL 1 MMOL/ML IV SOLN

[Series 4: T1 · coronal · 4.0mm · 0.89mm/px · 6 of 35 slices shown (1 of 4)]
[im 1/35]
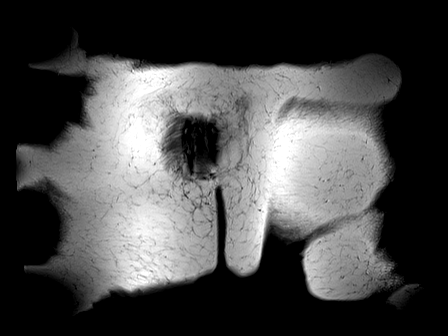
[im 7/35]
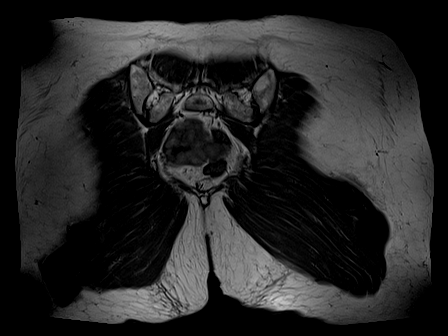
[im 14/35]
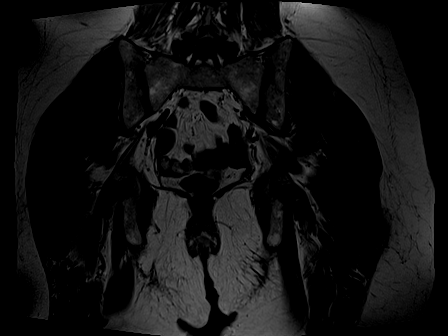
[im 21/35]
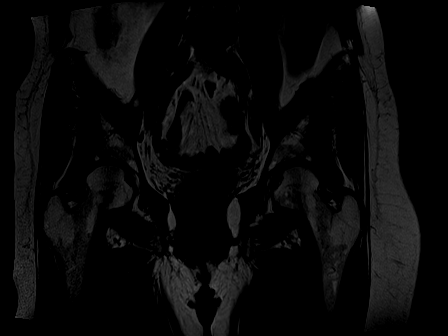
[im 28/35]
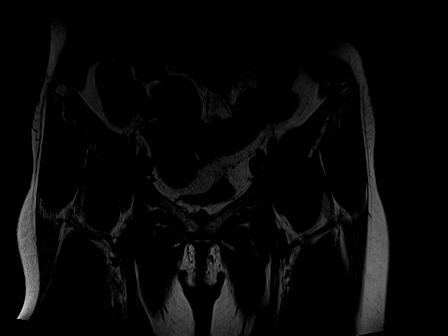
[im 35/35]
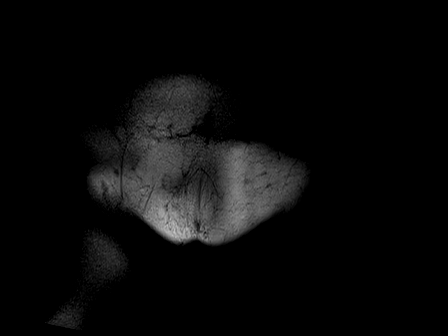

[Series 5: STIR · coronal · 4.0mm · 1.25mm/px · 6 of 35 slices shown]
[im 1/35]
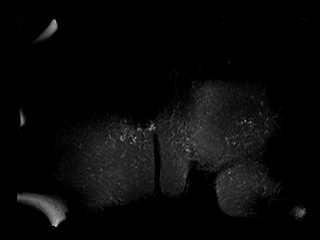
[im 7/35]
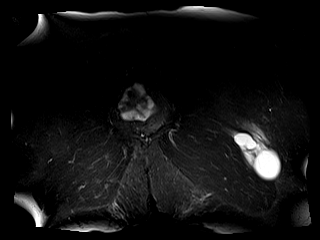
[im 14/35]
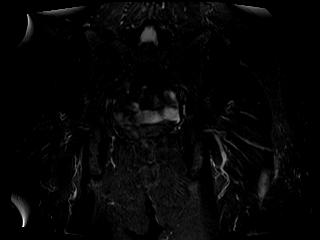
[im 21/35]
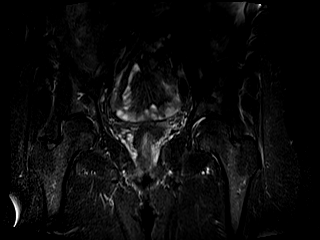
[im 28/35]
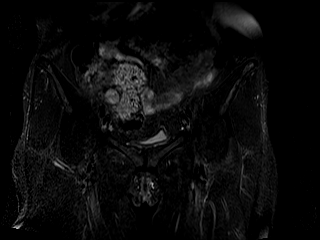
[im 35/35]
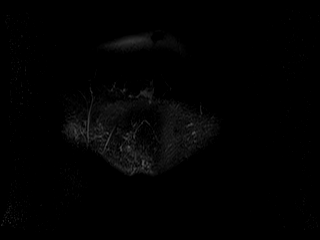

[Series 7: T1 · coronal · 4.0mm · 0.39mm/px · 3 of 17 slices shown (2 of 4)]
[im 1/17]
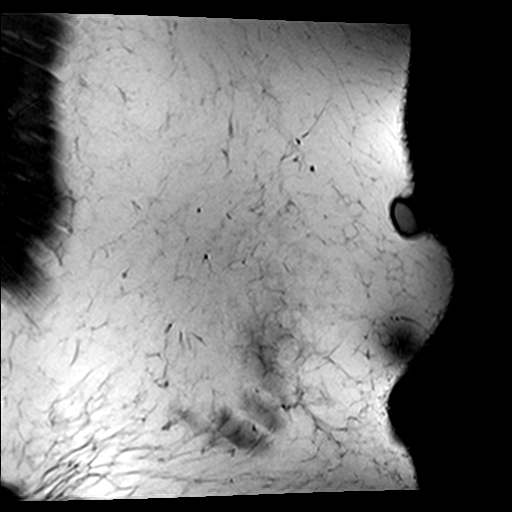
[im 9/17]
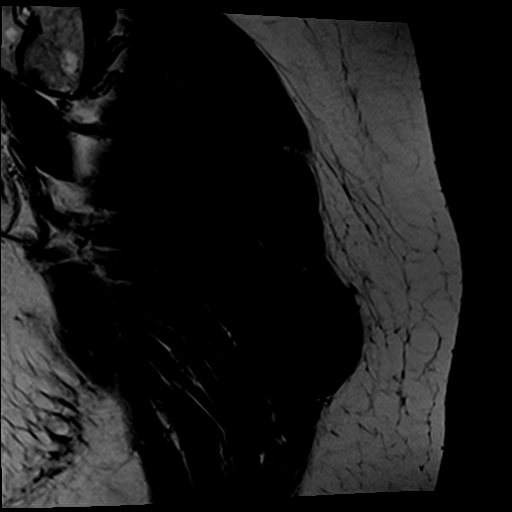
[im 17/17]
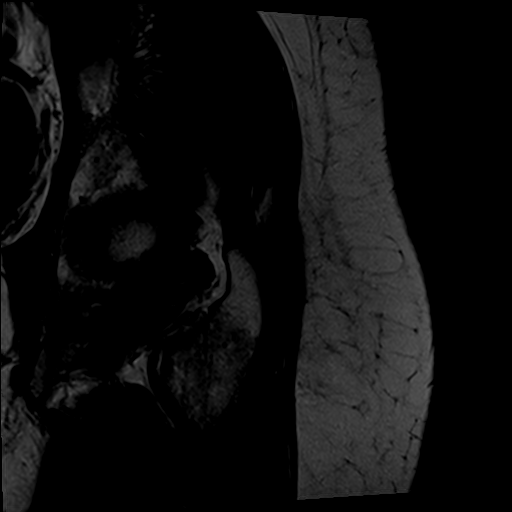

[Series 8: T1 · sagittal · 4.0mm · 0.62mm/px · 4 of 24 slices shown (3 of 4)]
[im 1/24]
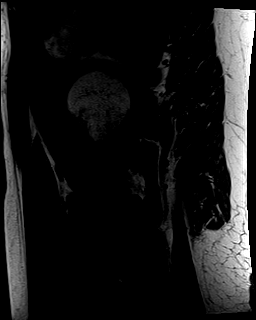
[im 8/24]
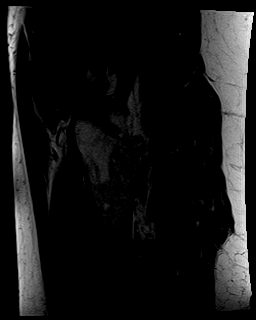
[im 16/24]
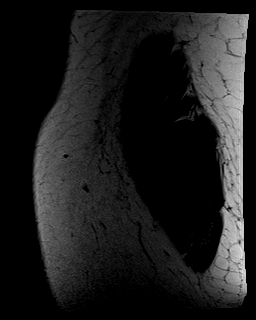
[im 24/24]
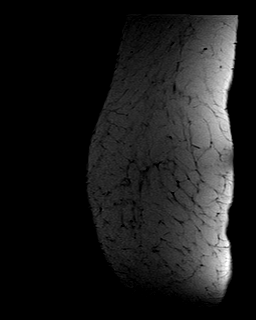

[Series 9: T1 · axial · 6.0mm · 0.86mm/px · z∈[-103,+12]mm · 2 of 17 slices shown (4 of 4)]
[im 1/17]
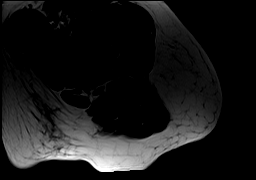
[im 17/17]
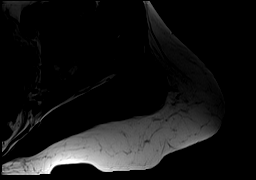

[Series 10: T2 fat-sat · coronal · 4.0mm · 0.62mm/px · 2 of 17 slices shown (1 of 3)]
[im 1/17]
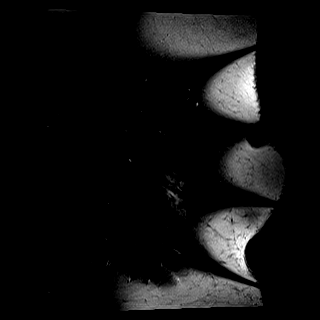
[im 17/17]
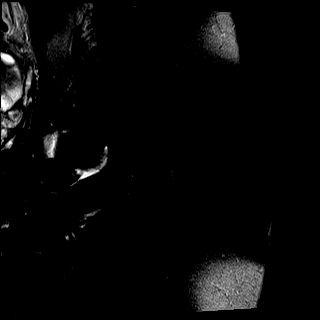

[Series 11: T2 fat-sat · sagittal · 4.0mm · 0.62mm/px · 3 of 24 slices shown (2 of 3)]
[im 1/24]
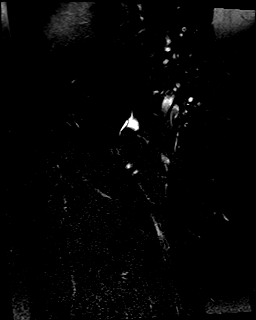
[im 12/24]
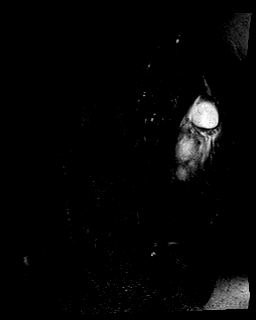
[im 24/24]
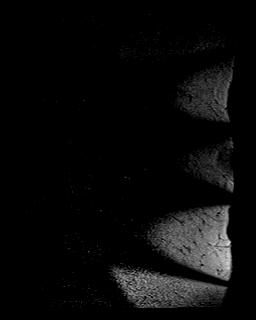

[Series 12: T2 fat-sat · axial · 6.0mm · 0.69mm/px · z∈[-103,+12]mm · 2 of 17 slices shown (3 of 3)]
[im 1/17]
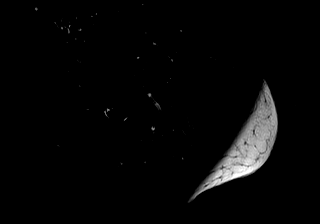
[im 17/17]
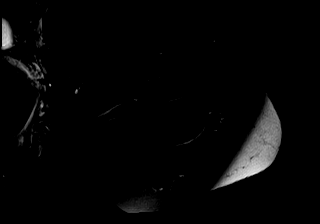

[Series 13: T1 fat-sat post-contrast · coronal · 4.0mm · 0.39mm/px · 2 of 17 slices shown (1 of 3)]
[im 1/17]
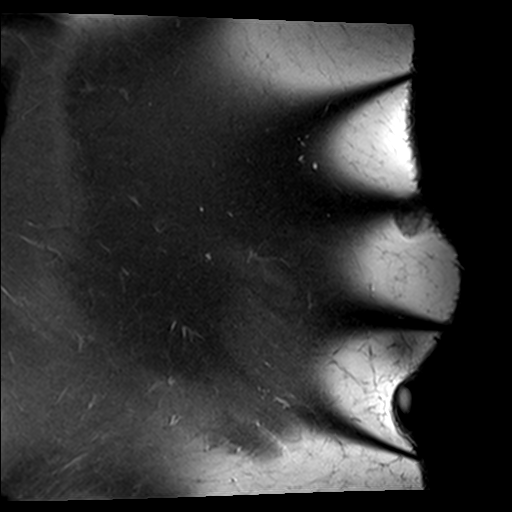
[im 17/17]
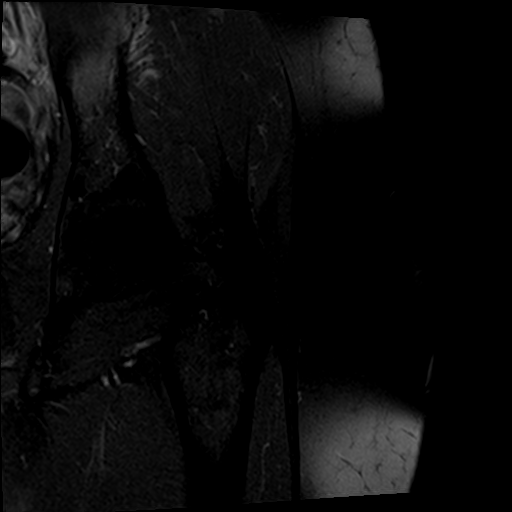

[Series 14: T1 fat-sat post-contrast · sagittal · 4.0mm · 0.62mm/px · 3 of 24 slices shown (2 of 3)]
[im 1/24]
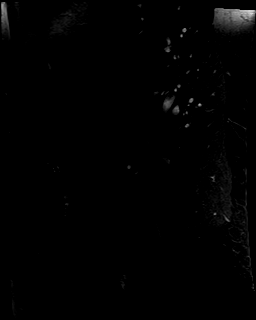
[im 12/24]
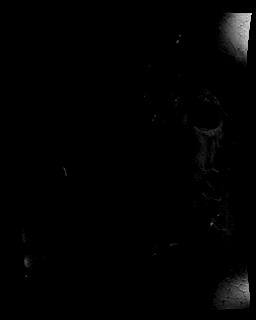
[im 24/24]
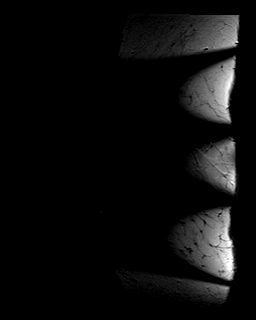

[Series 15: T1 fat-sat post-contrast · axial · 6.0mm · 0.86mm/px · z∈[-103,+12]mm · 2 of 17 slices shown (3 of 3)]
[im 1/17]
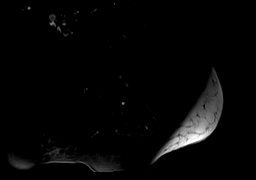
[im 17/17]
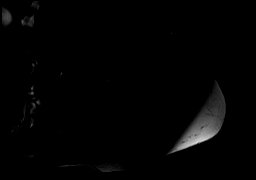

[Series 16: T1 fat-sat · coronal · 4.0mm · 0.89mm/px · 5 of 35 slices shown]
[im 1/35]
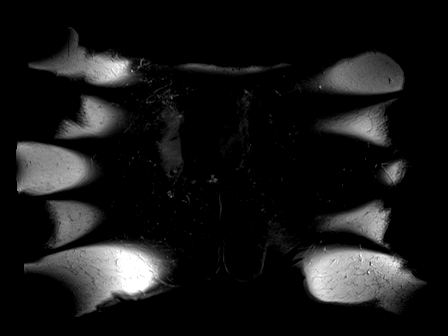
[im 9/35]
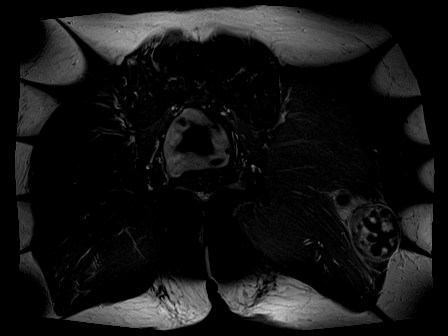
[im 18/35]
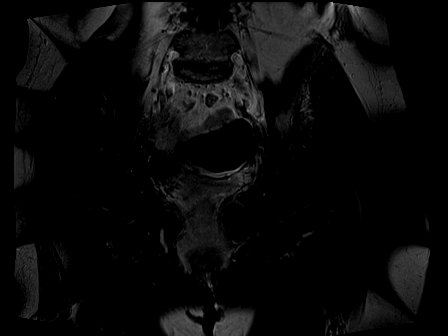
[im 26/35]
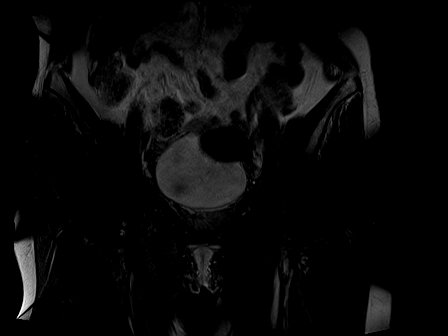
[im 35/35]
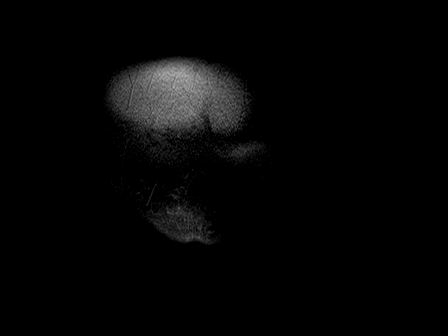

[40 of 40 positions shown; findings below may reference images not displayed]

FINDINGS: Bones: Normal.

No joint effusions. No appreciable arthritis.

Muscles and tendons

Muscles and tendons: There is a lobulated 8.2 by 4.2 by 3.7 cm
inhomogeneous irregularly enhancing mass in the inferolateral aspect
of the left gluteus maximus muscle. There is slight edema and
enhancement in muscle adjacent to the lobulated mass.

Other findings

Miscellaneous: No adenopathy. The bladder and visualized bowel in
the pelvis appear normal. Hysterectomy. No adnexal masses.
IMPRESSION: 1. Lobulated irregularly enhancing mass in the inferolateral aspect
of the left gluteus maximus muscle with adjacent edema and
enhancement in the adjacent muscle. This could represent a soft
tissue sarcoma.
2. No other significant abnormalities.
3. Critical Value/emergent results were called by telephone at the
time of interpretation on [DATE] at [DATE] to provider DAHIANA
DAHIANA , who verbally acknowledged these results.

## 2019-08-16 MED ORDER — GADOBUTROL 1 MMOL/ML IV SOLN
6.0000 mL | Freq: Once | INTRAVENOUS | Status: AC | PRN
  Administered 2019-08-16: 7.5 mL via INTRAVENOUS

## 2019-08-16 NOTE — Progress Notes (Signed)
MRI hip as we discussed.  I did call [facility] oncology orthopedics and they have set you up for an appointment tomorrow.  They should have contacted you already.

## 2019-08-16 NOTE — Telephone Encounter (Signed)
Patient requesting a call back to discuss hip xrays done in 2020. Patient has noticed a large lump on left side which she is having checked out. Patient wants to know if the xrays from 1 year ago can be looked at to see if anything is noticed on the x-rays? Please call to advise. Patient going to a doctor to be checked for cancer tomorrow.

## 2019-08-16 NOTE — Telephone Encounter (Signed)
Patient advised that her last hip x-rays Impression: Unremarkable x-ray of bilateral hip joints.

## 2019-08-16 NOTE — Telephone Encounter (Signed)
I called Alexandra Garner about her hip MRI results.  MRI is concerning for sarcoma. I spoke with her regarding the concern and plan for urgent referral to [place]

## 2019-08-26 HISTORY — PX: BUTTOCK MASS EXCISION: SHX1279

## 2019-08-31 ENCOUNTER — Encounter: Payer: Self-pay | Admitting: Rheumatology

## 2019-08-31 ENCOUNTER — Telehealth: Payer: Self-pay | Admitting: Family Medicine

## 2019-08-31 NOTE — Telephone Encounter (Signed)
I called Alexandra Garner to see how she is doing.  She had biopsy on February 23 and excision of tumor March 4.  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  We are still awaiting final pathology results from the entire tumor. Alexandra Garner is doing well and will see me an update when she hears back from her oncologist regarding the results of pathology.

## 2019-09-01 ENCOUNTER — Telehealth: Payer: Self-pay | Admitting: Rheumatology

## 2019-09-01 NOTE — Telephone Encounter (Signed)
We received a phone call from patient regarding a left gluteal mass which was recently removed from at [facility].  The final pathology report is pending.  I called patient today.  She states that tumor was removed and the margins were clear.  She is uncertain for how long this tumor has been there.  She is awaiting on final pathology report.  She wanted a copy of her hip joint x-rays done almost 1 year ago.  We have made a CD and will give it to her.  At that time patient presented with bilateral trochanteric bursa pain and was diagnosed with trochanteric bursitis.  I have advised patient to keep me posted regarding her results.  Bo Merino, MD

## 2019-09-02 NOTE — Telephone Encounter (Signed)
Error

## 2019-09-08 ENCOUNTER — Encounter: Payer: Self-pay | Admitting: Family Medicine

## 2019-09-08 ENCOUNTER — Encounter: Payer: Self-pay | Admitting: Rheumatology

## 2019-09-13 ENCOUNTER — Encounter: Payer: Self-pay | Admitting: Certified Nurse Midwife

## 2019-09-29 NOTE — Progress Notes (Signed)
Office Visit Note  Patient: Alexandra Garner             Date of Birth: 1961-05-05           MRN: [id number]             PCP: Leamon Arnt, MD Referring: Leamon Arnt, MD Visit Date: 10/06/2019 Occupation: @GUAROCC @  Subjective:  Medication monitoring   History of Present Illness: Alexandra Garner is a 59 y.o. female with history of autoimmune disease.  She states she has been doing quite well on Plaquenil once a day.  She denies any history of joint pain or joint swelling.  She has not had any recent problems with Raynaud's phenomenon or photosensitivity.  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  She is gradually healing from that.  She is 6 weeks postop.  She still have some soreness in the area.  Activities of Daily Living:  Patient reports morning stiffness for 30 minutes.   Patient Reports nocturnal pain.  Difficulty dressing/grooming: Denies Difficulty climbing stairs: Denies Difficulty getting out of chair: Denies Difficulty using hands for taps, buttons, cutlery, and/or writing: Denies  Review of Systems  Constitutional: Negative for fatigue, night sweats, weight gain and weight loss.  HENT: Negative for mouth sores, trouble swallowing, trouble swallowing, mouth dryness and nose dryness.   Eyes: Negative for pain, redness, visual disturbance and dryness.  Respiratory: Negative for cough, shortness of breath and difficulty breathing.   Cardiovascular: Positive for swelling in legs/feet. Negative for chest pain, palpitations, hypertension and irregular heartbeat.  Gastrointestinal: Negative for blood in stool, constipation and diarrhea.  Endocrine: Negative for excessive thirst and increased urination.  Genitourinary: Negative for difficulty urinating and vaginal dryness.  Musculoskeletal: Positive for arthralgias, joint pain, muscle weakness and morning stiffness. Negative for joint swelling, myalgias, muscle tenderness and myalgias.   Skin: Negative for color change, rash, hair loss, skin tightness, ulcers and sensitivity to sunlight.  Allergic/Immunologic: Negative for susceptible to infections.  Neurological: Positive for weakness. Negative for dizziness, memory loss and night sweats.  Hematological: Negative for bruising/bleeding tendency and swollen glands.  Psychiatric/Behavioral: Negative for depressed mood and sleep disturbance. The patient is not nervous/anxious.     PMFS History:  Patient Active Problem List   Diagnosis Date Noted  . Palpitations 08/24/2018  . Dyshidrotic eczema 11/14/2017  . Antiphospholipid syndrome ([place]) 11/14/2017  . 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 Hormone replacement therapy (HRT) 11/14/2017  . History of migraine 11/07/2017  . Autoimmune disease ([place]) 10/20/2016  . High risk medication use 09/26/2016  . Raynaud's disease without gangrene 09/19/2016  . Primary osteoarthritis of both feet 09/19/2016  . Family history of macular degeneration 02/03/2015  . Primary localized osteoarthrosis of hand 12/23/2013  . History of colonic polyps 11/01/2008    Past Medical History:  Diagnosis Date  . 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  . 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 Coagulation defect ([place])    hypercoagulation-[name]   . Fibroid   . Hand pain    left hand-had 2 cortisone injections  . Hx of migraines   . Irregular heart rate 08/24/2018  . Lupus ([place])   . Post-operative nausea and vomiting   . RA (rheumatoid arthritis) ([facility])     Family History  Problem Relation Age of Onset  . Clotting disorder Mother   . Prostate cancer Father   . Colon polyps Father   . Cervical  cancer Maternal Grandmother   . Healthy Daughter   . Colon cancer Neg Hx   . Esophageal cancer Neg Hx   . Rectal cancer Neg Hx   . Stomach cancer Neg Hx    Past Surgical History:  Procedure Laterality Date  . ABDOMINAL HYSTERECTOMY    . BREAST SURGERY    . BUTTOCK MASS EXCISION     . COLONOSCOPY  03/28/2008  . PELVIC LAPAROSCOPY     Social History   Social History Narrative  . Not on file   Immunization History  Administered Date(s) Administered  . Tdap 10/01/2011  . Zoster Recombinat (Shingrix) 04/15/2019, 06/17/2019     Objective: Vital Signs: BP 104/61 (BP Location: Left Arm, Patient Position: Sitting, Cuff Size: Normal)   Pulse 70   Resp 14   Ht 5\' 6"  (1.676 m)   Wt 134 lb (60.8 kg)   LMP 10/11/2012   BMI 21.63 kg/m    Physical Exam Vitals and nursing note reviewed.  Constitutional:      Appearance: She is well-developed.  HENT:     Head: Normocephalic and atraumatic.  Eyes:     Conjunctiva/sclera: Conjunctivae normal.  Cardiovascular:     Rate and Rhythm: Normal rate and regular rhythm.     Heart sounds: Normal heart sounds.  Pulmonary:     Effort: Pulmonary effort is normal.     Breath sounds: Normal breath sounds.  Abdominal:     General: Bowel sounds are normal.     Palpations: Abdomen is soft.  Musculoskeletal:     Cervical back: Normal range of motion.  Lymphadenopathy:     Cervical: No cervical adenopathy.  Skin:    General: Skin is warm and dry.     Capillary Refill: Capillary refill takes less than 2 seconds.  Neurological:     Mental Status: She is alert and oriented to person, place, and time.  Psychiatric:        Behavior: Behavior normal.      Musculoskeletal Exam: C-spine thoracic and lumbar spine with good range of motion.  Shoulder joints, elbow joints, wrist joints, MCPs PIPs DIPs with good range of motion with no synovitis.  Hip joints, knee joints, ankles, MTPs and PIPs with good range of motion with no synovitis.  CDAI Exam: CDAI Score: - Patient Global: -; Provider Global: - Swollen: -; Tender: - Joint Exam 10/06/2019   No joint exam has been documented for this visit   There is currently no information documented on the homunculus. Go to the Rheumatology activity and complete the homunculus joint exam.   Investigation: No additional findings.  Imaging: No results found.  Recent Labs: Lab Results  Component Value Date   WBC 8.2 06/08/2019   HGB 12.9 06/08/2019   PLT 296 06/08/2019   NA 140 06/08/2019   K 3.5 06/08/2019   CL 101 06/08/2019   CO2 22 06/08/2019   GLUCOSE 91 06/08/2019   BUN 13 06/08/2019   CREATININE 0.74 06/08/2019   BILITOT 0.4 06/08/2019   ALKPHOS 48 06/08/2019   AST 17 06/08/2019   ALT 13 06/08/2019   PROT 7.4 06/08/2019   ALBUMIN 4.9 06/08/2019   CALCIUM 9.7 06/08/2019   GFRAA 105 06/08/2019    Speciality Comments: Plaquenil eye exam: normal on 11/13/18 per [facility]  Procedures:  No procedures performed Allergies: Penicillins, Adhesive [tape], and Latex   Assessment / Plan:     Visit Diagnoses: Autoimmune disease ([place]) - History of arthralgias, [name], photosensitivity, positive  aCL IgM46, positive beta 2 IgM 100.  Her disease is quiet on Plaquenil once a day.  She has not had any recent joint pain or joint swelling.  She denies any Raynaud's or photosensitivity.  Use of sunscreen was emphasized.  High risk medication use - PLQ 200 mg 1 tablet by mouth daily. eye exam: 11/13/2018.  She had eye exam recently.  Will obtain labs today.  Raynaud's disease without gangrene-doing well.  Antiphospholipid antibody positive-IgM-she is on aspirin.  Positive cardiolipin antibodies, positive b2 GP1 - She takes aspirin 162 mg po daily.   Primary osteoarthritis of both hands-she has minimal discomfort.  Primary osteoarthritis of both feet-doing well.  Trochanteric bursitis of both hips-doing better as she has not been as active recently.  DDD (degenerative disc disease), cervical - MRI cervical spine multilevel DDD and spondylosis with moderate neuroforaminal narrowing  DDD (degenerative disc disease), lumbar - Record review May 12, 2019 MRI lumbar spine-L5-S1 disc bulge and mild to moderate neural foraminal narrowing.  History of  migraine  History of colonic polyps - She had a colonoscopy performed on 12/18/2018, which normal.  Repeat colonoscopy in 10 years.   Other fatigue  Vitamin D deficiency-patient has been taking vitamin D 1000 units a day.  Have advised her to increase it to 2000 units a day.  We will check vitamin D level with the next labs.  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  It was resected from the left gluteal region.  She is gradually recovering from it.  Osteoporosis screen-she is postmenopausal.  We we will schedule DEXA scan.  Orders: Orders Placed This Encounter  Procedures  . DG BONE DENSITY (DXA)  . CBC with Differential/Platelet  . COMPLETE METABOLIC PANEL WITH GFR  . Urinalysis, Routine w reflex microscopic  . Anti-DNA antibody, double-stranded  . C3 and C4  . Sedimentation rate  . Beta-2 glycoprotein antibodies  . Cardiolipin antibodies, IgG, IgM, IgA   No orders of the defined types were placed in this encounter.     Follow-Up Instructions: Return in about 5 months (around 03/07/2020) for Autoimmune disease.   Bo Merino, MD  Note - This record has been created using Editor, commissioning.  Chart creation errors have been sought, but may not always  have been located. Such creation errors do not reflect on  the standard of medical care.

## 2019-10-06 ENCOUNTER — Ambulatory Visit: Payer: 59 | Admitting: Rheumatology

## 2019-10-06 ENCOUNTER — Other Ambulatory Visit: Payer: Self-pay

## 2019-10-06 ENCOUNTER — Encounter: Payer: Self-pay | Admitting: Rheumatology

## 2019-10-06 VITALS — BP 104/61 | HR 70 | Resp 14 | Ht 66.0 in | Wt 134.0 lb

## 2019-10-06 DIAGNOSIS — Z780 Asymptomatic menopausal state: Secondary | ICD-10-CM

## 2019-10-06 DIAGNOSIS — M19041 Primary osteoarthritis, right hand: Secondary | ICD-10-CM

## 2019-10-06 DIAGNOSIS — I7300 Raynaud's syndrome without gangrene: Secondary | ICD-10-CM | POA: Diagnosis not present

## 2019-10-06 DIAGNOSIS — R760 Raised antibody titer: Secondary | ICD-10-CM | POA: Diagnosis not present

## 2019-10-06 DIAGNOSIS — Z8669 Personal history of other diseases of the nervous system and sense organs: Secondary | ICD-10-CM

## 2019-10-06 DIAGNOSIS — M19042 Primary osteoarthritis, left hand: Secondary | ICD-10-CM

## 2019-10-06 DIAGNOSIS — Z79899 Other long term (current) drug therapy: Secondary | ICD-10-CM

## 2019-10-06 DIAGNOSIS — M7061 Trochanteric bursitis, right hip: Secondary | ICD-10-CM

## 2019-10-06 DIAGNOSIS — M359 Systemic involvement of connective tissue, unspecified: Secondary | ICD-10-CM

## 2019-10-06 DIAGNOSIS — Z86010 Personal history of colonic polyps: Secondary | ICD-10-CM

## 2019-10-06 DIAGNOSIS — M19071 Primary osteoarthritis, right ankle and foot: Secondary | ICD-10-CM

## 2019-10-06 DIAGNOSIS — M19072 Primary osteoarthritis, left ankle and foot: Secondary | ICD-10-CM

## 2019-10-06 DIAGNOSIS — M5136 Other intervertebral disc degeneration, lumbar region: Secondary | ICD-10-CM

## 2019-10-06 DIAGNOSIS — D219 Benign neoplasm of connective and other soft tissue, unspecified: Secondary | ICD-10-CM

## 2019-10-06 DIAGNOSIS — R5383 Other fatigue: Secondary | ICD-10-CM

## 2019-10-06 DIAGNOSIS — E559 Vitamin D deficiency, unspecified: Secondary | ICD-10-CM

## 2019-10-06 DIAGNOSIS — M7062 Trochanteric bursitis, left hip: Secondary | ICD-10-CM

## 2019-10-06 DIAGNOSIS — M5030 Other cervical disc degeneration, unspecified cervical region: Secondary | ICD-10-CM

## 2019-10-06 DIAGNOSIS — Z13820 Encounter for screening for osteoporosis: Secondary | ICD-10-CM

## 2019-10-07 LAB — COMPLETE METABOLIC PANEL WITH GFR
AG Ratio: 2 (calc) (ref 1.0–2.5)
ALT: 10 U/L (ref 6–29)
AST: 15 U/L (ref 10–35)
Albumin: 4.3 g/dL (ref 3.6–5.1)
Alkaline phosphatase (APISO): 34 U/L — ABNORMAL LOW (ref 37–153)
BUN: 11 mg/dL (ref 7–25)
CO2: 26 mmol/L (ref 20–32)
Calcium: 9.4 mg/dL (ref 8.6–10.4)
Chloride: 106 mmol/L (ref 98–110)
Creat: 0.72 mg/dL (ref 0.50–1.05)
GFR, Est African American: 108 mL/min/{1.73_m2} (ref >=60)
GFR, Est Non African American: 93 mL/min/{1.73_m2} (ref >=60)
Globulin: 2.1 g/dL (calc) (ref 1.9–3.7)
Glucose, Bld: 82 mg/dL (ref 65–99)
Potassium: 4.2 mmol/L (ref 3.5–5.3)
Sodium: 140 mmol/L (ref 135–146)
Total Bilirubin: 0.4 mg/dL (ref 0.2–1.2)
Total Protein: 6.4 g/dL (ref 6.1–8.1)

## 2019-10-07 LAB — BETA-2 GLYCOPROTEIN ANTIBODIES
Beta-2 Glyco 1 IgA: <9 SAU (ref <=20)
Beta-2 Glyco 1 IgM: 36 SMU — ABNORMAL HIGH (ref <=20)
Beta-2 Glyco I IgG: <9 SGU (ref <=20)

## 2019-10-07 LAB — CBC WITH DIFFERENTIAL/PLATELET
Absolute Monocytes: 340 cells/uL (ref 200–950)
Basophils Absolute: 40 cells/uL (ref 0–200)
Basophils Relative: 0.8 %
Eosinophils Absolute: 130 cells/uL (ref 15–500)
Eosinophils Relative: 2.6 %
HCT: 37.5 % (ref 35.0–45.0)
Hemoglobin: 12.5 g/dL (ref 11.7–15.5)
Lymphs Abs: 1170 cells/uL (ref 850–3900)
MCH: 29.3 pg (ref 27.0–33.0)
MCHC: 33.3 g/dL (ref 32.0–36.0)
MCV: 88 fL (ref 80.0–100.0)
MPV: 11.2 fL (ref 7.5–12.5)
Monocytes Relative: 6.8 %
Neutro Abs: 3320 cells/uL (ref 1500–7800)
Neutrophils Relative %: 66.4 %
Platelets: 261 10*3/uL (ref 140–400)
RBC: 4.26 10*6/uL (ref 3.80–5.10)
RDW: 12.7 % (ref 11.0–15.0)
Total Lymphocyte: 23.4 %
WBC: 5 10*3/uL (ref 3.8–10.8)

## 2019-10-07 LAB — URINALYSIS, ROUTINE W REFLEX MICROSCOPIC
Bilirubin Urine: NEGATIVE
Glucose, UA: NEGATIVE
Hgb urine dipstick: NEGATIVE
Leukocytes,Ua: NEGATIVE
Nitrite: NEGATIVE
Protein, ur: NEGATIVE
Specific Gravity, Urine: 1.025 (ref 1.001–1.03)
pH: <=5 (ref 5.0–8.0)

## 2019-10-07 LAB — CARDIOLIPIN ANTIBODIES, IGG, IGM, IGA
Anticardiolipin IgA: <11 [APL'U]
Anticardiolipin IgG: <14 [GPL'U]
Anticardiolipin IgM: 28 [MPL'U] — ABNORMAL HIGH

## 2019-10-07 LAB — ANTI-DNA ANTIBODY, DOUBLE-STRANDED: ds DNA Ab: 1 IU/mL

## 2019-10-07 LAB — SEDIMENTATION RATE: Sed Rate: 9 mm/h (ref 0–30)

## 2019-10-07 LAB — C3 AND C4
C3 Complement: 103 mg/dL (ref 83–193)
C4 Complement: 16 mg/dL (ref 15–57)

## 2019-10-07 NOTE — Progress Notes (Signed)
Beta-2 IgM and anticardiolipin IgM are still positive and low titers.  Complements are normal and sed rate is normal.  No change in treatment advised.

## 2019-10-07 NOTE — Progress Notes (Signed)
CBC is normal, CMP is normal, alk phos is low which is not significant.  Sed rate and complements are normal.  UA shows trace ketones which is not significant.  Cardiolipin antibodies are pending.

## 2019-10-22 ENCOUNTER — Other Ambulatory Visit: Payer: Self-pay | Admitting: Obstetrics & Gynecology

## 2019-10-22 NOTE — Telephone Encounter (Signed)
Medication refill request: Premarin  Last AEX:  08-31-2018 SM  Next AEX: 01-27-20 Last MMG (if hormonal medication request): 08-19-17 density C/BIRADS 1 negative  Refill authorized: Today, please advise.   Medication pended for #90, 0RF. Please refill if appropriate.

## 2019-12-29 ENCOUNTER — Other Ambulatory Visit: Payer: Self-pay

## 2019-12-29 ENCOUNTER — Encounter: Payer: Self-pay | Admitting: Obstetrics & Gynecology

## 2019-12-29 MED ORDER — ESTROGENS CONJUGATED 0.625 MG PO TABS: ORAL_TABLET | ORAL | 0 refills | Status: DC

## 2019-12-29 NOTE — Telephone Encounter (Signed)
[name], [name]  P [facility] Good morning,  [facility] alerted me that my Premarin prescription has no more refills available.  My annual exam is not until 8.5.2021. I do not have enough medicine to get me to that appointment.  Please send in a 30-day prescription.  Thank you,  Alexandra Garner

## 2019-12-29 NOTE — Telephone Encounter (Signed)
Medication refill request: Premarin Last AEX:  08/31/18 SM Next AEX: 01/27/20 Last MMG (if hormonal medication request): 08/19/17 BIRADS 1 negative/density c Refill authorized: Today, Please advise on refill

## 2020-01-10 ENCOUNTER — Ambulatory Visit (INDEPENDENT_AMBULATORY_CARE_PROVIDER_SITE_OTHER): Payer: 59 | Admitting: Family Medicine

## 2020-01-10 ENCOUNTER — Other Ambulatory Visit: Payer: Self-pay

## 2020-01-10 ENCOUNTER — Encounter: Payer: Self-pay | Admitting: Family Medicine

## 2020-01-10 VITALS — BP 110/62 | HR 99 | Temp 97.9°F | Ht 66.0 in | Wt 137.6 lb

## 2020-01-10 DIAGNOSIS — M359 Systemic involvement of connective tissue, unspecified: Secondary | ICD-10-CM | POA: Diagnosis not present

## 2020-01-10 DIAGNOSIS — Z0000 Encounter for general adult medical examination without abnormal findings: Secondary | ICD-10-CM

## 2020-01-10 DIAGNOSIS — D219 Benign neoplasm of connective and other soft tissue, unspecified: Secondary | ICD-10-CM

## 2020-01-10 DIAGNOSIS — D6861 Antiphospholipid syndrome: Secondary | ICD-10-CM

## 2020-01-10 DIAGNOSIS — Z79890 Hormone replacement therapy (postmenopausal): Secondary | ICD-10-CM

## 2020-01-10 HISTORY — DX: Benign neoplasm of connective and other soft tissue, unspecified: D21.9

## 2020-01-10 LAB — LIPID PANEL
Cholesterol: 178 mg/dL (ref 0–200)
HDL: 71 mg/dL (ref >39.00)
LDL Cholesterol: 85 mg/dL (ref 0–99)
NonHDL: 106.87
Total CHOL/HDL Ratio: 3
Triglycerides: 107 mg/dL (ref 0.0–149.0)
VLDL: 21.4 mg/dL (ref 0.0–40.0)

## 2020-01-10 LAB — COMPREHENSIVE METABOLIC PANEL
ALT: 11 U/L (ref 0–35)
AST: 16 U/L (ref 0–37)
Albumin: 3.9 g/dL (ref 3.5–5.2)
Alkaline Phosphatase: 33 U/L — ABNORMAL LOW (ref 39–117)
BUN: 11 mg/dL (ref 6–23)
CO2: 27 mEq/L (ref 19–32)
Calcium: 9 mg/dL (ref 8.4–10.5)
Chloride: 105 mEq/L (ref 96–112)
Creatinine, Ser: 0.73 mg/dL (ref 0.40–1.20)
GFR: 82 mL/min (ref >60.00)
Glucose, Bld: 94 mg/dL (ref 70–99)
Potassium: 4.6 mEq/L (ref 3.5–5.1)
Sodium: 139 mEq/L (ref 135–145)
Total Bilirubin: 0.3 mg/dL (ref 0.2–1.2)
Total Protein: 5.9 g/dL — ABNORMAL LOW (ref 6.0–8.3)

## 2020-01-10 LAB — CBC WITH DIFFERENTIAL/PLATELET
Basophils Absolute: 0 10*3/uL (ref 0.0–0.1)
Basophils Relative: 0.6 % (ref 0.0–3.0)
Eosinophils Absolute: 0.1 10*3/uL (ref 0.0–0.7)
Eosinophils Relative: 2.8 % (ref 0.0–5.0)
HCT: 35.5 % — ABNORMAL LOW (ref 36.0–46.0)
Hemoglobin: 12.2 g/dL (ref 12.0–15.0)
Lymphocytes Relative: 27.3 % (ref 12.0–46.0)
Lymphs Abs: 1.3 10*3/uL (ref 0.7–4.0)
MCHC: 34.5 g/dL (ref 30.0–36.0)
MCV: 85 fl (ref 78.0–100.0)
Monocytes Absolute: 0.3 10*3/uL (ref 0.1–1.0)
Monocytes Relative: 6.8 % (ref 3.0–12.0)
Neutro Abs: 2.9 10*3/uL (ref 1.4–7.7)
Neutrophils Relative %: 62.5 % (ref 43.0–77.0)
Platelets: 243 10*3/uL (ref 150.0–400.0)
RBC: 4.17 Mil/uL (ref 3.87–5.11)
RDW: 12.9 % (ref 11.5–15.5)
WBC: 4.7 10*3/uL (ref 4.0–10.5)

## 2020-01-10 NOTE — Progress Notes (Signed)
Subjective  Chief Complaint  Patient presents with  . Annual Exam    fasting. No new concerns    HPI: Alexandra Garner is a 59 y.o. female who presents to [facility] [place] today for a Female Wellness Visit. She also has the concerns and/or needs as listed above in the chief complaint. These will be addressed in addition to the Health Maintenance Visit.   Wellness Visit: annual visit with health maintenance review and exam without Pap   Health maintenance: Doing well. Recovering well from surgery, has 31-month postop visit tomorrow. To see GYN next month and will have mammogram done at that time. Healthy lifestyle, walks 5 miles 5-6 times per week Chronic disease f/u and/or acute problem visit: (deemed necessary to be done in addition to the wellness visit):  Recent myxoma, status post radical resection left buttocks: Without complication, recovery is going well. Has mild weakness and discomfort.  Autoimmune disease status post recent visit with rheumatology. Lab work was all reassuring. Tolerating Plaquenil. Would like to see dermatology due to secondary melasma for possible laser therapy. Eye exam up-to-date  HRT: Stable per GYN  Assessment  1. Annual physical exam   2. Myxoma   3. Autoimmune disease ([place])   4. Antiphospholipid syndrome ([place])   5. Hormone replacement therapy (HRT)      Plan  Female Wellness Visit:  Age appropriate Health Maintenance and Prevention measures were discussed with patient. Included topics are cancer screening recommendations, ways to keep healthy (see AVS) including dietary and exercise recommendations, regular eye and dental care, use of seat belts, and avoidance of moderate alcohol use and tobacco use. Patient had mammogram next month  BMI: discussed patient's BMI and encouraged positive lifestyle modifications to help get to or maintain a target BMI.  HM needs and immunizations were addressed and ordered. See below for orders.  See HM and immunization section for updates.  Routine labs and screening tests ordered including cmp, cbc and lipids where appropriate.  Discussed recommendations regarding Vit D and calcium supplementation (see AVS)  Chronic disease management visit and/or acute problem visit:  Doing well with her chronic medical problems and recovering well from recent surgery. No changes in medications needed at this time. Follow up: 12 months for CPE Orders Placed This Encounter  Procedures  . CBC with Differential/Platelet  . Comprehensive metabolic panel  . Lipid panel   No orders of the defined types were placed in this encounter.     Lifestyle: Body mass index is 22.21 kg/m. Wt Readings from Last 3 Encounters:  01/10/20 137 lb 9.6 oz (62.4 kg)  10/06/19 134 lb (60.8 kg)  08/05/19 138 lb (62.6 kg)    Patient Active Problem List   Diagnosis Date Noted  . Myxoma 01/10/2020    Priority: High    Left buttock; s/p radical resection 08/2019   . Antiphospholipid syndrome ([place]) 11/14/2017    Priority: High  . Hormone replacement therapy (HRT) 11/14/2017    Priority: High  . Autoimmune disease ([place]) 10/20/2016    Priority: High    History of arthralgias, [name], positive aCL IgM46, positive beta 2 100   . High risk medication use 09/26/2016    Priority: High    Plaquenil 200 mg 1 tablet a.m. and half tablet p.m.  PLQ Eye Exam: 11/22/16 WNL    . Palpitations 08/24/2018    Priority: Medium    Evaluated by [facility] cardiology: 14 day cardiac monitor with brady and symptomatic pvcs,  normal Echo. No further evaluation needed.    Marland Kitchen History of migraine 11/07/2017    Priority: Medium  . History of colonic polyps 11/01/2008    Priority: Medium  . Dyshidrotic eczema 11/14/2017    Priority: Low  . 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 Raynaud's disease without gangrene 09/19/2016    Priority: Low  . Primary osteoarthritis of both feet 09/19/2016    Priority: Low  .  Family history of macular degeneration 02/03/2015    Priority: Low    Grandmother and early findings in mother   . Primary localized osteoarthrosis of hand 12/23/2013    Priority: Low   Health Maintenance  Topic Date Due  . MAMMOGRAM  09/11/2019  . INFLUENZA VACCINE  01/23/2020  . TETANUS/TDAP  09/30/2021  . COLONOSCOPY  12/17/2028  . COVID-19 Vaccine  Completed  . Hepatitis C Screening  Completed  . HIV Screening  Completed   Immunization History  Administered Date(s) Administered  . PFIZER SARS-COV-2 Vaccination 07/20/2019, 08/11/2019  . Tdap 10/01/2011  . Zoster Recombinat (Shingrix) 04/15/2019, 06/17/2019   We updated and reviewed the patient's past history in detail and it is documented below. Allergies: Patient is allergic to penicillins, adhesive [tape], and latex. Past Medical History Patient  has a past medical history of Antiphospholipid antibody positive (03/29/2016), Autoimmune disease ([place]), Fibroid, Hand pain, migraines, Myxoma (01/10/2020), Post-operative nausea and vomiting, and RA (rheumatoid arthritis) ([place]). Past Surgical History Patient  has a past surgical history that includes Breast surgery; Abdominal hysterectomy; Pelvic laparoscopy; Colonoscopy (03/28/2008); and Buttock mass excision. Family History: Patient family history includes Cervical cancer in her maternal grandmother; Clotting disorder in her mother; Colon polyps in her father; Healthy in her daughter; Prostate cancer in her father. Social History:  Patient  reports that she has never smoked. She has never used smokeless tobacco. She reports current alcohol use of about 2.0 - 3.0 standard drinks of alcohol per week. She reports that she does not use drugs.  Review of Systems: Constitutional: negative for fever or malaise Ophthalmic: negative for photophobia, double vision or loss of vision Cardiovascular: negative for chest pain, dyspnea on exertion, or new LE swelling Respiratory: negative for  SOB or persistent cough Gastrointestinal: negative for abdominal pain, change in bowel habits or melena Genitourinary: negative for dysuria or gross hematuria, no abnormal uterine bleeding or disharge Musculoskeletal: negative for new gait disturbance or muscular weakness Integumentary: negative for new or persistent rashes, no breast lumps Neurological: negative for TIA or stroke symptoms Psychiatric: negative for SI or delusions Allergic/Immunologic: negative for hives  Patient Care Team    Relationship Specialty Notifications Start End  Leamon Arnt, MD PCP - General Family Medicine  11/14/17   [name], MD Consulting Physician Gynecology  11/14/17   Irene Shipper, MD Consulting Physician Gastroenterology  11/14/17   Bo Merino, MD Consulting Physician Rheumatology  11/14/17     Objective  Vitals: BP 110/62 (BP Location: Left Arm, Patient Position: Sitting, Cuff Size: Normal)   Pulse 99   Temp 97.9 F (36.6 C) (Temporal)   Ht 5\' 6"  (1.676 m)   Wt 137 lb 9.6 oz (62.4 kg)   LMP 10/11/2012   SpO2 99%   BMI 22.21 kg/m  General:  Well developed, well nourished, no acute distress  Psych:  Alert and orientedx3,normal mood and affect HEENT:  Normocephalic, atraumatic, non-icteric sclera,  supple neck without adenopathy, mass or thyromegaly Cardiovascular:  Normal S1, S2, RRR without gallop,  rub or murmur Respiratory:  Good breath sounds bilaterally, CTAB with normal respiratory effort Gastrointestinal: normal bowel sounds, soft, non-tender, no noted masses. No HSM MSK: no deformities, contusions. Joints are without erythema or swelling.  Skin:  Warm, no rashes or suspicious lesions noted, melasma bilateral on face Neurologic:    Mental status is normal.  No tremor   Commons side effects, risks, benefits, and alternatives for medications and treatment plan prescribed today were discussed, and the patient expressed understanding of the given instructions. Patient is  instructed to call or message via MyChart if he/she has any questions or concerns regarding our treatment plan. No barriers to understanding were identified. We discussed Red Flag symptoms and signs in detail. Patient expressed understanding regarding what to do in case of urgent or emergency type symptoms.   Medication list was reconciled, printed and provided to the patient in AVS. Patient instructions and summary information was reviewed with the patient as documented in the AVS. This note was prepared with assistance of Dragon voice recognition software. Occasional wrong-word or sound-a-like substitutions may have occurred due to the inherent limitations of voice recognition software  This visit occurred during the SARS-CoV-2 public health emergency.  Safety protocols were in place, including screening questions prior to the visit, additional usage of staff PPE, and extensive cleaning of exam room while observing appropriate contact time as indicated for disinfecting solutions.

## 2020-01-10 NOTE — Patient Instructions (Signed)
Please return in 12 months for your annual complete physical; please come fasting.   I will release your lab results to you on your MyChart account with further instructions. Please reply with any questions.    If you have any questions or concerns, please don't hesitate to send me a message via MyChart or call the office at [phone]. Thank you for visiting with us today! It's our pleasure caring for you.    

## 2020-01-25 NOTE — Progress Notes (Signed)
59 y.o. G1P1 Married White or Caucasian female here for annual exam.  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  He is applying for scholarships to do a year program between [place] and grad school.  Denies vaginal bleeding.    Had gluteus maximus myxoma removed by Dr. Mylo Red.     Patient's last menstrual period was 10/11/2012.          Sexually active: Yes.    The current method of family planning is status post hysterectomy.    Exercising: Yes.    weights, eliptical & walking Smoker:  no  Health Maintenance: Pap:  2012 neg History of abnormal Pap:  no MMG:  08-19-2017 category c density birads 1:neg Colonoscopy:  12-18-2018  BMD:  Dr. Estanislado Pandy recommends having this done this year TDaP:  2013 Pneumonia vaccine(s):  2016 Shingrix:   2020 Hep C testing: neg 2017 Screening Labs: just done in early July   reports that she has never smoked. She has never used smokeless tobacco. She reports current alcohol use of about 2.0 - 3.0 standard drinks of alcohol per week. She reports that she does not use drugs.  Past Medical History:  Diagnosis Date  . 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  . Autoimmune disease ([place])    in the Lupus family  . Fibroid   . Hand pain    left hand-had 2 cortisone injections  . Hx of migraines   . 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 Post-operative nausea and vomiting   . RA (rheumatoid arthritis) ([place])     Past Surgical History:  Procedure Laterality Date  . ABDOMINAL HYSTERECTOMY    . BREAST SURGERY    . BUTTOCK MASS EXCISION Left    08-26-2019  . COLONOSCOPY  03/28/2008  . PELVIC LAPAROSCOPY      Current Outpatient Medications  Medication Sig Dispense Refill  . acetaminophen (TYLENOL) 500 MG tablet Take by mouth.    Marland Kitchen aspirin 81 MG tablet Take 81 mg by mouth 2 (two) times daily.     Marland Kitchen estrogens, conjugated, (PREMARIN) 0.625 MG tablet TAKE 1 TABLET (0.625 MG TOTAL) BY MOUTH DAILY. 90 tablet 0  .  hydroxychloroquine (PLAQUENIL) 200 MG tablet Take 1 tablet (200 mg total) by mouth daily. 90 tablet 0  . ibuprofen (ADVIL) 400 MG tablet Take by mouth.    . Multiple Vitamins-Minerals (PRESERVISION AREDS) CAPS Take 1 tablet by mouth in the morning and at bedtime.    . Vitamin D, Cholecalciferol, 25 MCG (1000 UT) TABS Take 1 tablet by mouth daily.     No current facility-administered medications for this visit.    Family History  Problem Relation Age of Onset  . Clotting disorder Mother   . Prostate cancer Father   . Colon polyps Father   . Cervical cancer Maternal Grandmother   . Healthy Daughter     Review of Systems  Constitutional: Negative.   HENT: Negative.   Eyes: Negative.   Respiratory: Negative.   Cardiovascular: Negative.   Gastrointestinal: Negative.   Endocrine: Negative.   Genitourinary: Negative.   Musculoskeletal: Negative.   Skin: Negative.   Allergic/Immunologic: Negative.   Neurological: Negative.   Hematological: Negative.   Psychiatric/Behavioral: Negative.     Exam:   BP 106/62   Pulse 68   Resp 16   Ht 5' 5.25" (1.657 m)   Wt 137 lb (62.1 kg)   LMP 10/11/2012   BMI 22.62 kg/m   Height: 5' 5.25" (  165.7 cm)  General appearance: alert, cooperative and appears stated age Head: Normocephalic, without obvious abnormality, atraumatic Neck: no adenopathy, supple, symmetrical, trachea midline and thyroid normal to inspection and palpation Lungs: clear to auscultation bilaterally Breasts: normal appearance, no masses or tenderness Heart: regular rate and rhythm Abdomen: soft, non-tender; bowel sounds normal; no masses,  no organomegaly Extremities: extremities normal, atraumatic, no cyanosis or edema Skin: Skin color, texture, turgor normal. No rashes or lesions Lymph nodes: Cervical, supraclavicular, and axillary nodes normal. No abnormal inguinal nodes palpated Neurologic: Grossly normal   Pelvic: External genitalia:  no lesions               Urethra:  normal appearing urethra with no masses, tenderness or lesions              Bartholins and Skenes: normal                 Vagina: normal appearing vagina with normal color and discharge, no lesions              Cervix: absent              Pap taken: No. Bimanual Exam:  Uterus:  uterus absent              Adnexa: no mass, fullness, tenderness               Rectovaginal: Confirms               Anus:  normal sphincter tone, no lesions  Chaperone, Terence Lux, CMA, was present for exam.  A:  Well Woman with normal exam PMP, on HRT H/o antiphospholipid antibody positive status with positive Beta2 glycoprotein.   Has nonspecific autoimmune disease H/o TAH 2005, ovaries repair H/o palpitations with cardiac evaluation in 2020 Myxoma removal earlier this year, will be followed for two years by surgeon  P:   Mammogram is overdue.  She knows this needs to be scheduled. BMD order placed to do with MMG pap smear not indicated Colonoscopy done 2020 No premarin RF done today.  Will await MMG results. Vaccines up to date Lab work done in July with Dr. Jonni Sanger return annually or prn

## 2020-01-27 ENCOUNTER — Encounter: Payer: Self-pay | Admitting: Obstetrics & Gynecology

## 2020-01-27 ENCOUNTER — Other Ambulatory Visit: Payer: Self-pay

## 2020-01-27 ENCOUNTER — Ambulatory Visit: Payer: 59 | Admitting: Obstetrics & Gynecology

## 2020-01-27 VITALS — BP 106/62 | HR 68 | Resp 16 | Ht 65.25 in | Wt 137.0 lb

## 2020-01-27 DIAGNOSIS — E2839 Other primary ovarian failure: Secondary | ICD-10-CM

## 2020-01-27 DIAGNOSIS — Z01419 Encounter for gynecological examination (general) (routine) without abnormal findings: Secondary | ICD-10-CM | POA: Diagnosis not present

## 2020-01-27 MED ORDER — ESTROGENS CONJUGATED 0.625 MG PO TABS: ORAL_TABLET | ORAL | 4 refills | Status: DC

## 2020-02-09 ENCOUNTER — Other Ambulatory Visit: Payer: Self-pay | Admitting: Obstetrics & Gynecology

## 2020-02-09 DIAGNOSIS — Z1231 Encounter for screening mammogram for malignant neoplasm of breast: Secondary | ICD-10-CM

## 2020-02-09 DIAGNOSIS — E2839 Other primary ovarian failure: Secondary | ICD-10-CM

## 2020-02-15 ENCOUNTER — Ambulatory Visit
Admission: RE | Admit: 2020-02-15 | Discharge: 2020-02-15 | Disposition: A | Discharge status: Home / Self Care | Payer: 59 | Source: Ambulatory Visit | Attending: Obstetrics & Gynecology | Admitting: Obstetrics & Gynecology

## 2020-02-15 ENCOUNTER — Other Ambulatory Visit: Payer: Self-pay

## 2020-02-15 DIAGNOSIS — Z1231 Encounter for screening mammogram for malignant neoplasm of breast: Secondary | ICD-10-CM

## 2020-02-15 IMAGING — MG DIGITAL SCREENING BREAST BILAT IMPLANT W/ TOMO W/ CAD
8 of 12 series · 8 of 28 positions shown · non-contrast
Comparison: Previous exam(s).

CLINICAL DATA: Screening.

EXAM:
DIGITAL SCREENING BILATERAL MAMMOGRAM WITH IMPLANTS, CAD AND TOMO
The patient has retropectoral implants. Standard and implant
displaced views were performed.

[R MLO]
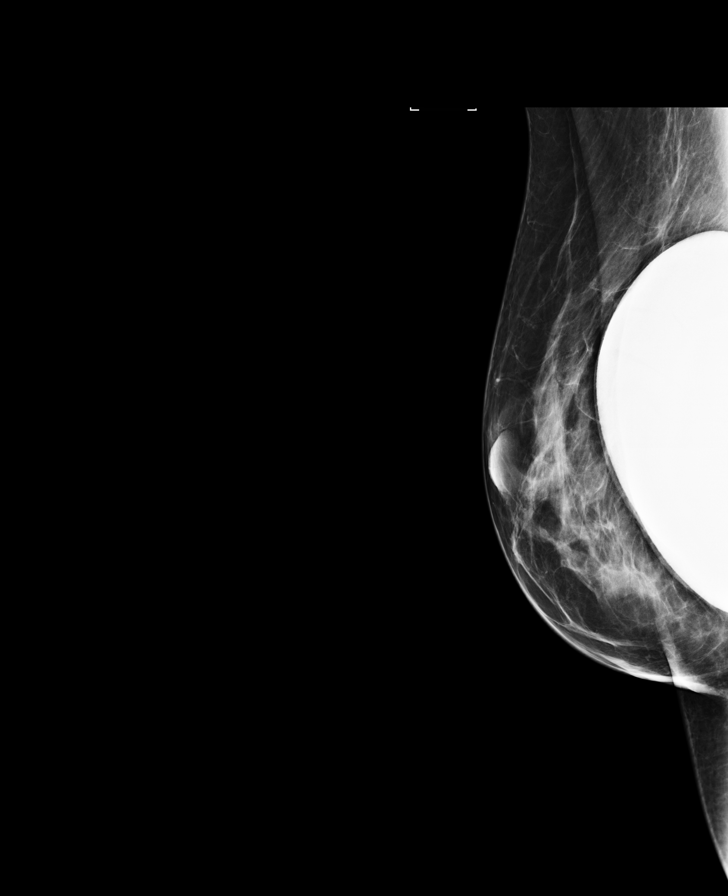

[L MLO]
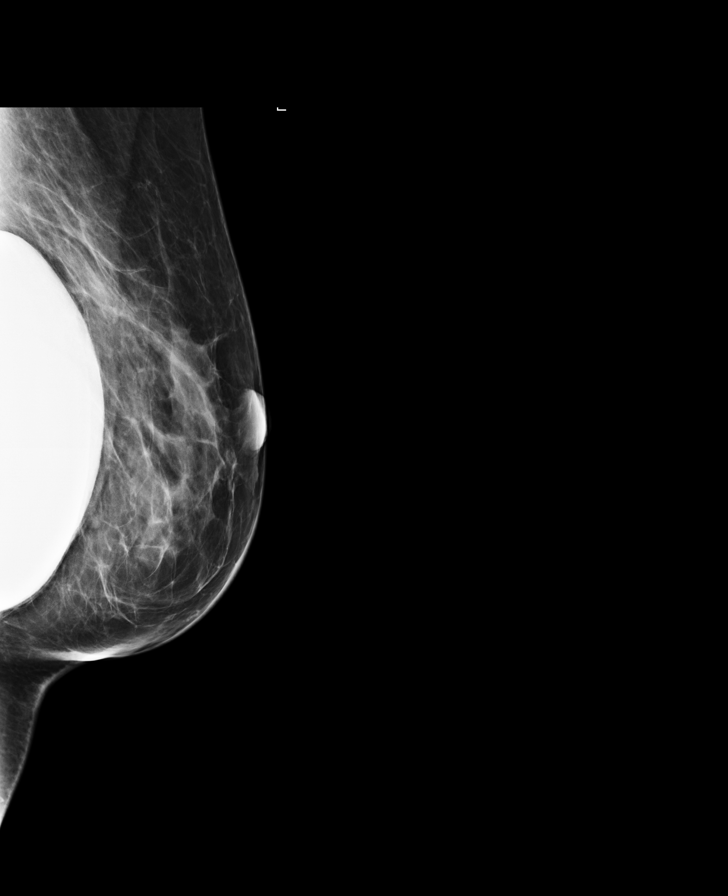

[L CC]
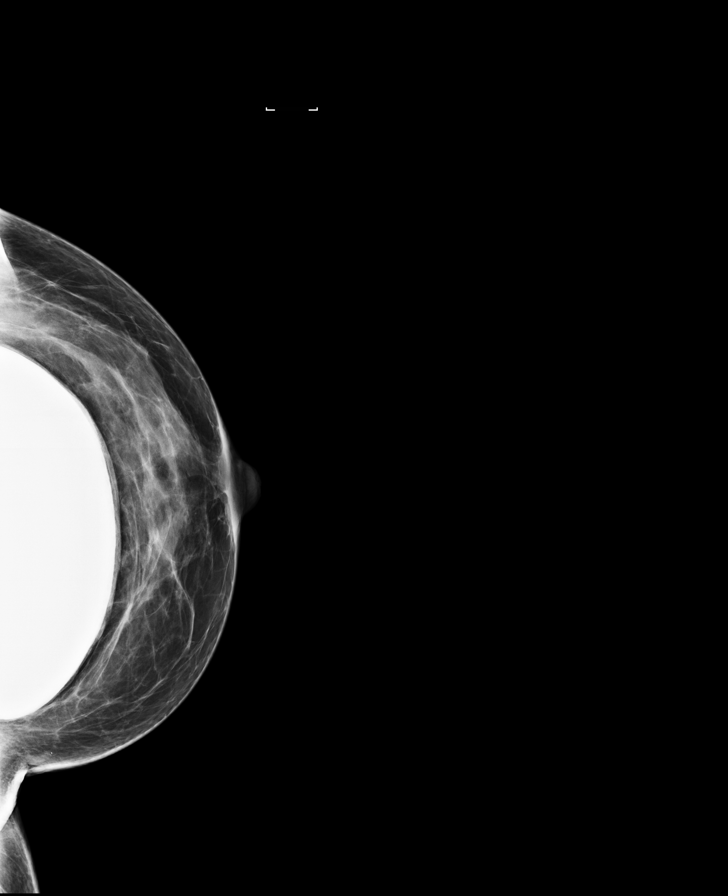

[R CC]
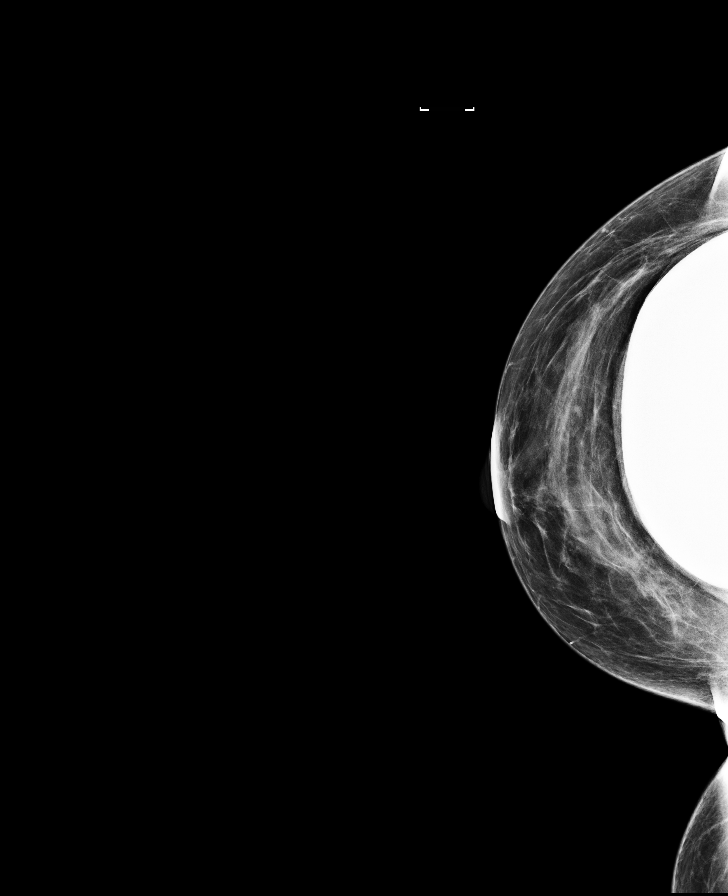

[L MLO synth-2D]
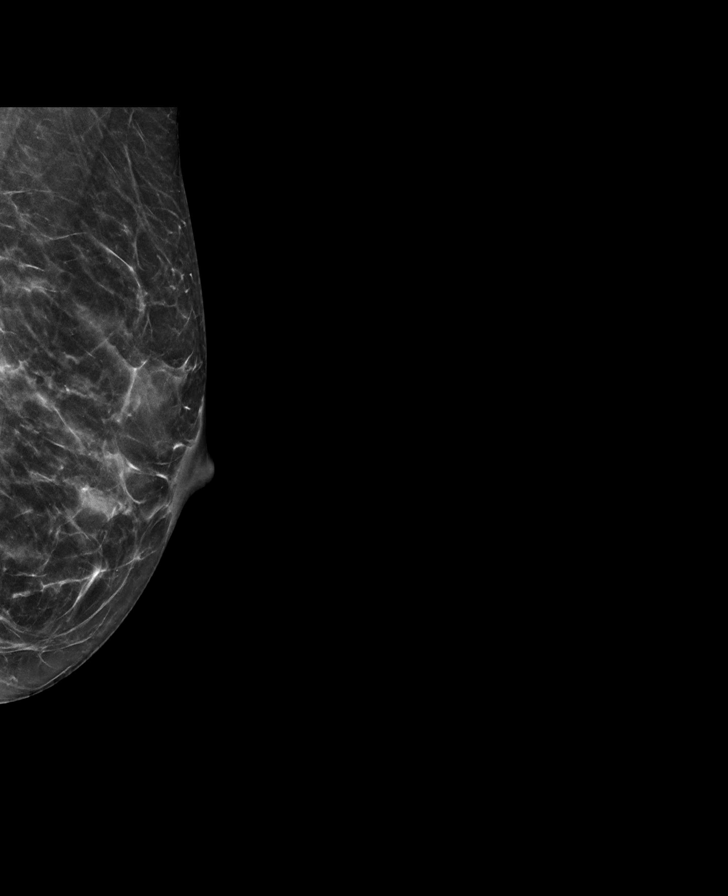

[R CC synth-2D]
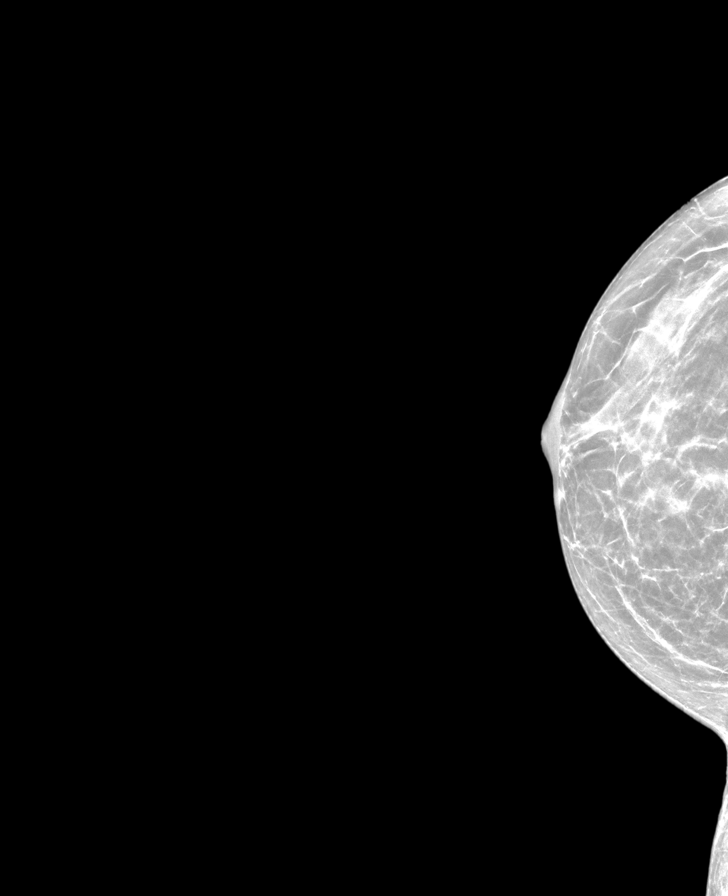

[R MLO synth-2D]
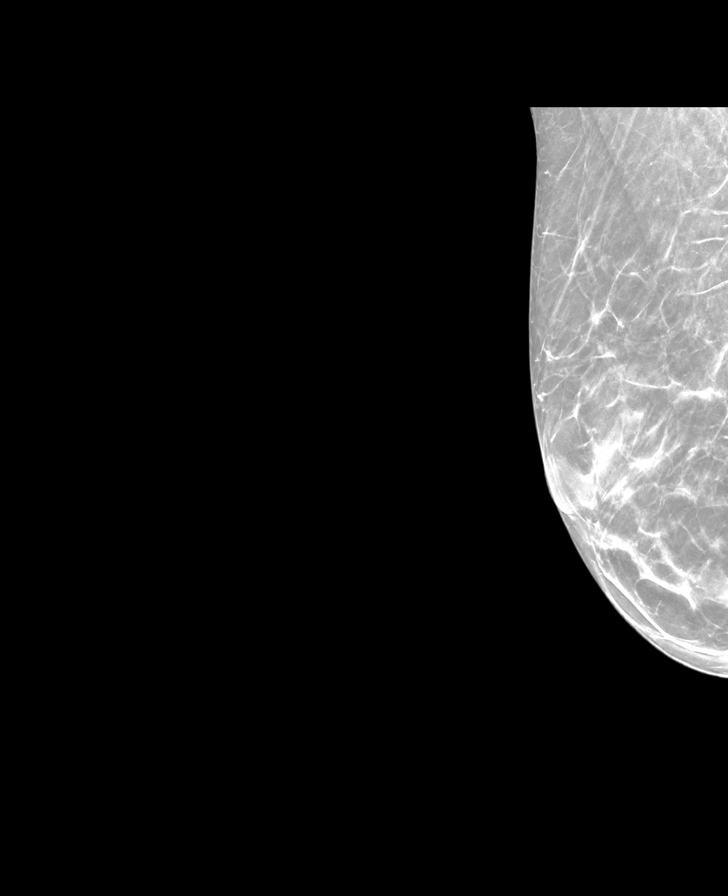

[L CC synth-2D]
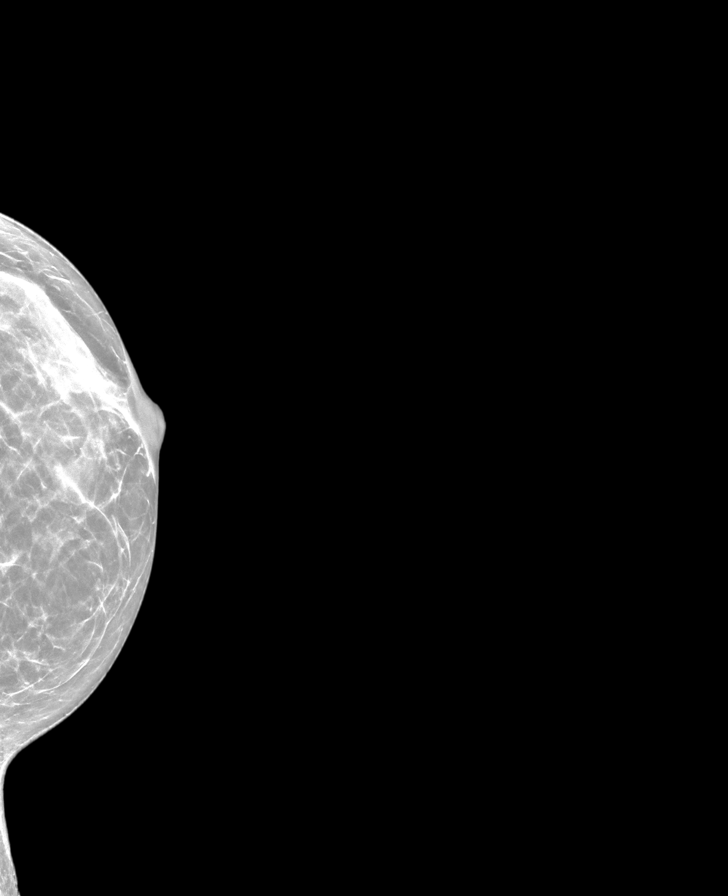

[8 of 28 positions shown; findings below may reference images not displayed]

ACR Breast Density Category b: There are scattered areas of
fibroglandular density.
FINDINGS: There are no findings suspicious for malignancy. Images were
processed with CAD.
IMPRESSION: No mammographic evidence of malignancy. A result letter of this
screening mammogram will be mailed directly to the patient.

RECOMMENDATION:
Screening mammogram in one year. (Code:[6A])

BI-RADS CATEGORY  1:  Negative.

## 2020-02-25 NOTE — Progress Notes (Signed)
Office Visit Note  Patient: Alexandra Garner             Date of Birth: December 19, 1960           MRN: [id number]             PCP: Leamon Arnt, MD Referring: Leamon Arnt, MD Visit Date: 03/09/2020 Occupation: @GUAROCC @  Subjective:  Pedal edema   History of Present Illness: Alexandra Garner is a 59 y.o. female with history of autoimmune disease, osteoarthritis, and DDD.  She is taking plaquenil 200 mg  1 tablet by mouth every other day.  Patient reports that she reduced her dose of Plaquenil from 1 tablet daily to 1 tablet every other day about 4 to 5 months ago.  She states for the past 1 to 2 months she has been experiencing increased arthralgias, myalgias, and fatigue.  She has also noticed increased photosensitivity.  She states that she was at the beach several weeks ago and tried to remain in the shade the entire time but experienced significant swelling in bilateral lower extremities and was not feeling well by the third day.  She denies any joint pain or joint swelling at this time but continues to feel some tightness in bilateral lower extremities due to recurrence of the pedal edema.  She tries to walk 5 miles 5 days a week for exercise.  She denies any other recent rashes.  She denies any recent symptoms of Raynaud's.  She continues to take aspirin 162 mg daily. She denies any sicca symptoms.  She has occasional nasal ulcerations.    Activities of Daily Living:  Patient reports morning stiffness for  20-30 minutes.   Patient Reports nocturnal pain.  Difficulty dressing/grooming: Denies Difficulty climbing stairs: Denies Difficulty getting out of chair: Denies Difficulty using hands for taps, buttons, cutlery, and/or writing: Reports  Review of Systems  Constitutional: Negative for fatigue.  HENT: Negative for mouth sores, mouth dryness and nose dryness.        Nose sores  Eyes: Negative for pain and dryness.  Respiratory: Negative for shortness of breath and difficulty breathing.    Cardiovascular: Negative for chest pain and palpitations.  Gastrointestinal: Positive for blood in stool. Negative for constipation and diarrhea.  Endocrine: Negative for increased urination.  Genitourinary: Negative for difficulty urinating.  Musculoskeletal: Positive for arthralgias, joint pain, muscle weakness and morning stiffness. Negative for joint swelling, myalgias, muscle tenderness and myalgias.  Skin: Positive for redness. Negative for color change and rash.  Allergic/Immunologic: Negative for susceptible to infections.  Neurological: Negative for dizziness, numbness, headaches, memory loss and weakness.  Hematological: Positive for bruising/bleeding tendency.  Psychiatric/Behavioral: Negative for confusion.    PMFS History:  Patient Active Problem List   Diagnosis Date Noted  . 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 Palpitations 08/24/2018  . Dyshidrotic eczema 11/14/2017  . Antiphospholipid syndrome ([place]) 11/14/2017  . 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 Hormone replacement therapy (HRT) 11/14/2017  . History of migraine 11/07/2017  . Autoimmune disease ([place]) 10/20/2016  . High risk medication use 09/26/2016  . Raynaud's disease without gangrene 09/19/2016  . Primary osteoarthritis of both feet 09/19/2016  . Family history of macular degeneration 02/03/2015  . Primary localized osteoarthrosis of hand 12/23/2013  . History of colonic polyps 11/01/2008    Past Medical History:  Diagnosis Date  . 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  . 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 Fibroid   . Hand pain  left hand-had 2 cortisone injections  . Hx of migraines   . 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 Post-operative nausea and vomiting   . RA (rheumatoid arthritis) ([facility])     Family History  Problem Relation Age of Onset  . Clotting disorder Mother   . Prostate cancer Father   . Colon polyps Father   . Cervical cancer Maternal  Grandmother   . Healthy Daughter    Past Surgical History:  Procedure Laterality Date  . ABDOMINAL HYSTERECTOMY    . AUGMENTATION MAMMAPLASTY    . BREAST SURGERY    . BUTTOCK MASS EXCISION Left 08/26/2019   Dr. Janice Coffin  . COLONOSCOPY  03/28/2008  . PELVIC LAPAROSCOPY    . REDUCTION MAMMAPLASTY     Social History   Social History Narrative  . Not on file   Immunization History  Administered Date(s) Administered  . PFIZER SARS-COV-2 Vaccination 07/20/2019, 08/11/2019  . Tdap 10/01/2011  . Zoster Recombinat (Shingrix) 04/15/2019, 06/17/2019     Objective: Vital Signs: BP 127/81 (BP Location: Left Arm, Patient Position: Sitting, Cuff Size: Normal)   Pulse 62   Resp 13   Ht 5\' 6"  (1.676 m)   Wt 137 lb 3.2 oz (62.2 kg)   LMP 10/11/2012   BMI 22.14 kg/m    Physical Exam Vitals and nursing note reviewed.  Constitutional:      Appearance: She is well-developed.  HENT:     Head: Normocephalic and atraumatic.  Eyes:     Conjunctiva/sclera: Conjunctivae normal.  Pulmonary:     Effort: Pulmonary effort is normal.  Abdominal:     Palpations: Abdomen is soft.  Musculoskeletal:     Cervical back: Normal range of motion.  Skin:    General: Skin is warm and dry.     Capillary Refill: Capillary refill takes less than 2 seconds.     Comments: No digital ulcerations or signs of gangrene.   Neurological:     Mental Status: She is alert and oriented to person, place, and time.  Psychiatric:        Behavior: Behavior normal.      Musculoskeletal Exam: C-spine, thoracic spine, lumbar spine have good range of motion.  Shoulder joints have good range of motion with some discomfort in the right shoulder.  Elbow joints, wrist joints, MCPs, PIPs, and DIPs have good range of motion with no synovitis.  She is able to make a complete fist bilaterally.  Hip joints, knee joints, and ankle joints have good range of motion with no synovitis.  No warmth or effusion of knee joints noted.   No tenderness or swelling of ankle joints.  Pitting edema noted bilaterally.  CDAI Exam: CDAI Score: -- Patient Global: --; Provider Global: -- Swollen: --; Tender: -- Joint Exam 03/09/2020   No joint exam has been documented for this visit   There is currently no information documented on the homunculus. Go to the Rheumatology activity and complete the homunculus joint exam.  Investigation: No additional findings.  Imaging: MM 3D SCREEN BREAST W/IMPLANT BILATERAL  Result Date: 02/15/2020 CLINICAL DATA:  Screening. EXAM: DIGITAL SCREENING BILATERAL MAMMOGRAM WITH IMPLANTS, CAD AND TOMO The patient has retropectoral implants. Standard and implant displaced views were performed. COMPARISON:  Previous exam(s). ACR Breast Density Category b: There are scattered areas of fibroglandular density. FINDINGS: There are no findings suspicious for malignancy. Images were processed with CAD. IMPRESSION: No mammographic evidence of malignancy. A result letter of this  screening mammogram will be mailed directly to the patient. RECOMMENDATION: Screening mammogram in one year. (Code:[id number]-01Y) BI-RADS CATEGORY  1:  Negative. Electronically Signed   By: Dorise Bullion III M.D   On: 02/15/2020 13:52    Recent Labs: Lab Results  Component Value Date   WBC 4.7 01/10/2020   HGB 12.2 01/10/2020   PLT 243.0 01/10/2020   NA 139 01/10/2020   K 4.6 01/10/2020   CL 105 01/10/2020   CO2 27 01/10/2020   GLUCOSE 94 01/10/2020   BUN 11 01/10/2020   CREATININE 0.73 01/10/2020   BILITOT 0.3 01/10/2020   ALKPHOS 33 (L) 01/10/2020   AST 16 01/10/2020   ALT 11 01/10/2020   PROT 5.9 (L) 01/10/2020   ALBUMIN 3.9 01/10/2020   CALCIUM 9.0 01/10/2020   GFRAA 108 10/06/2019    Speciality Comments: Plaquenil eye exam: normal on 11/13/18 per [facility]  Procedures:  No procedures performed Allergies: Penicillins, Adhesive [tape], and Latex   Assessment / Plan:     Visit Diagnoses: Autoimmune disease  ([place]) - History of arthralgias, [name], photosensitivity, positive aCL IgM46, positive beta 2 IgM 100: She has been experiencing increased fatigue, arthralgias, and myalgias over the past 1 to 2 months.  According to the patient she reduce the dose of Plaquenil from 1 tablet daily to 1 tablet every other day about 4 to 5 months ago since she was clinically doing well.  She has noticed increased photosensitivity this summer despite wearing sunscreen and trying to avoid direct sun exposure.  She has not had any recent rashes.  She continues to have recurrent nasal ulcerations but no oral ulcerations.  She is not experiencing any sicca symptoms at this time.  She has not had any recent symptoms of Raynaud's but we discussed the importance of avoiding triggers.  She will continue taking aspirin 162 mg daily.  No digital ulcerations or signs of gangrene were noted on examination today.  She had good capillary refill.  She is not experiencing any shortness of breath, chest pain, or palpitations.  Lab work from 10/06/2019 was reviewed with the patient today in the office.  Double-stranded DNA was negative, complements within normal limits, and sed rate within normal limits.  We will repeat autoimmune labs today.  Orders were released.  She was advised to increase the dose of Plaquenil from 1 tablet every other day to 1 tablet daily which was her original dose.  She had an updated Plaquenil eye exam in May 2021 and we will obtain these records.  She was advised to notify us if she develops any signs or symptoms of a flare.  She will follow-up in the office in 5 months.- Plan: CBC with Differential/Platelet, COMPLETE METABOLIC PANEL WITH GFR, Urinalysis, Routine w reflex microscopic, Anti-DNA antibody, double-stranded, Sedimentation rate, C3 and C4, hydroxychloroquine (PLAQUENIL) 200 MG tablet  High risk medication use - PLQ 200 mg 1 tablet by mouth daily. eye exam: 11/13/2018.  She had an updated Plaquenil eye exam in May  2021 and we will obtain updated records.  CBC and CMP were drawn on 01/10/2020.  We will repeat lab work today.  Orders for CBC and CMP were released.- Plan: CBC with Differential/Platelet, COMPLETE METABOLIC PANEL WITH GFR She has received both COVID-19 vaccinations.  Raynaud's disease without gangrene: Not currently active.  She has no digital ulcerations or signs of gangrene.  Good capillary refill noted on exam.  She will continue taking aspirin 162 mg daily.  Antiphospholipid antibody positive:  She is taking aspirin 162 mg daily.  Positive cardiolipin antibodies, positive b2 GP1 - She takes aspirin 162 mg po daily.   Primary osteoarthritis of both hands: She is not experiencing any discomfort or stiffness in her hands at this time.  She has no tenderness or inflammation.  She is able to make a complete fist bilaterally.  Joint protection and muscle strengthening were discussed.  Primary osteoarthritis of both feet: She is not experiencing any discomfort in her feet at this time.  She has good range of motion of both ankle joints with no tenderness or inflammation.  Pitting edema noted bilaterally.  She is wearing proper fitting shoes.   Trochanteric bursitis of both hips: She experiences occasional discomfort due to trochanteric bursitis.  Mild tenderness to palpation on examination today.  She was encouraged to perform stretching exercises on a daily basis.  DDD (degenerative disc disease), cervical - MRI cervical spine multilevel DDD and spondylosis with moderate neuroforaminal narrowing.  She has good range of motion of the C-spine on exam with no discomfort at this time.  She has no symptoms of radiculopathy.  DDD (degenerative disc disease), lumbar - Record review May 12, 2019 MRI lumbar spine-L5-S1 disc bulge and mild to moderate neural foraminal narrowing.  She is not experiencing any increased lower back pain or stiffness at this time.  She has no symptoms of  radiculopathy.  Pitting edema: Pitting edema was noted on examination today.  We discussed conservative treatment options today.  She was advised to limit her salt intake and start to wear compression stockings.  She was advised to also follow-up with her PCP if the pitting edema persists or worsens.  History of migraine: She experiences occasional headaches and takes Tylenol as needed for symptomatic relief.  History of colonic polyps - She had a colonoscopy performed on 12/18/2018, which normal.  Repeat colonoscopy in 10 years.   Vitamin D deficiency -Vitamin D was 28.9 on 06/08/2019.  We will recheck vitamin D level today.  She has been taking vitamin D 2000 units daily.  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   Other fatigue: She has been experiencing increased fatigue recently.  She walks 5 miles 5 days a week for exercise.  She was encouraged to continue her exercise regimen.   Osteoporosis screening - DEXA scheduled on 04/25/2020.    Orders: Orders Placed This Encounter  Procedures  . CBC with Differential/Platelet  . COMPLETE METABOLIC PANEL WITH GFR  . Urinalysis, Routine w reflex microscopic  . Anti-DNA antibody, double-stranded  . Sedimentation rate  . C3 and C4  . VITAMIN D 25 Hydroxy (Vit-D Deficiency, Fractures)   Meds ordered this encounter  Medications  . hydroxychloroquine (PLAQUENIL) 200 MG tablet    Sig: Take 1 tablet (200 mg total) by mouth daily.    Dispense:  90 tablet    Refill:  0      Follow-Up Instructions: Return in about 5 months (around 08/09/2020) for Autoimmune Disease, Osteoarthritis, DDD.   Ofilia Neas, PA-C  Note - This record has been created using Dragon software.  Chart creation errors have been sought, but may not always  have been located. Such creation errors do not reflect on  the standard of medical care.

## 2020-03-09 ENCOUNTER — Ambulatory Visit: Payer: 59 | Admitting: Physician Assistant

## 2020-03-09 ENCOUNTER — Other Ambulatory Visit: Payer: Self-pay

## 2020-03-09 ENCOUNTER — Encounter: Payer: Self-pay | Admitting: Physician Assistant

## 2020-03-09 VITALS — BP 127/81 | HR 62 | Resp 13 | Ht 66.0 in | Wt 137.2 lb

## 2020-03-09 DIAGNOSIS — R5383 Other fatigue: Secondary | ICD-10-CM

## 2020-03-09 DIAGNOSIS — M19042 Primary osteoarthritis, left hand: Secondary | ICD-10-CM

## 2020-03-09 DIAGNOSIS — R760 Raised antibody titer: Secondary | ICD-10-CM | POA: Diagnosis not present

## 2020-03-09 DIAGNOSIS — Z79899 Other long term (current) drug therapy: Secondary | ICD-10-CM

## 2020-03-09 DIAGNOSIS — M359 Systemic involvement of connective tissue, unspecified: Secondary | ICD-10-CM | POA: Diagnosis not present

## 2020-03-09 DIAGNOSIS — M5136 Other intervertebral disc degeneration, lumbar region: Secondary | ICD-10-CM

## 2020-03-09 DIAGNOSIS — E559 Vitamin D deficiency, unspecified: Secondary | ICD-10-CM

## 2020-03-09 DIAGNOSIS — M19072 Primary osteoarthritis, left ankle and foot: Secondary | ICD-10-CM

## 2020-03-09 DIAGNOSIS — M19071 Primary osteoarthritis, right ankle and foot: Secondary | ICD-10-CM

## 2020-03-09 DIAGNOSIS — I7300 Raynaud's syndrome without gangrene: Secondary | ICD-10-CM

## 2020-03-09 DIAGNOSIS — Z8669 Personal history of other diseases of the nervous system and sense organs: Secondary | ICD-10-CM

## 2020-03-09 DIAGNOSIS — Z86010 Personal history of colonic polyps: Secondary | ICD-10-CM

## 2020-03-09 DIAGNOSIS — M19041 Primary osteoarthritis, right hand: Secondary | ICD-10-CM

## 2020-03-09 DIAGNOSIS — R609 Edema, unspecified: Secondary | ICD-10-CM

## 2020-03-09 DIAGNOSIS — Z13820 Encounter for screening for osteoporosis: Secondary | ICD-10-CM

## 2020-03-09 DIAGNOSIS — M7062 Trochanteric bursitis, left hip: Secondary | ICD-10-CM

## 2020-03-09 DIAGNOSIS — D219 Benign neoplasm of connective and other soft tissue, unspecified: Secondary | ICD-10-CM

## 2020-03-09 DIAGNOSIS — M7061 Trochanteric bursitis, right hip: Secondary | ICD-10-CM

## 2020-03-09 DIAGNOSIS — M5030 Other cervical disc degeneration, unspecified cervical region: Secondary | ICD-10-CM

## 2020-03-09 MED ORDER — HYDROXYCHLOROQUINE SULFATE 200 MG PO TABS: 200.0000 mg | ORAL_TABLET | Freq: Every day | ORAL | 0 refills | Status: DC

## 2020-03-09 NOTE — Patient Instructions (Signed)

## 2020-03-10 ENCOUNTER — Encounter: Payer: Self-pay | Admitting: Family Medicine

## 2020-03-10 LAB — COMPLETE METABOLIC PANEL WITH GFR
AG Ratio: 1.8 (calc) (ref 1.0–2.5)
ALT: 13 U/L (ref 6–29)
AST: 17 U/L (ref 10–35)
Albumin: 4.3 g/dL (ref 3.6–5.1)
Alkaline phosphatase (APISO): 39 U/L (ref 37–153)
BUN: 10 mg/dL (ref 7–25)
CO2: 27 mmol/L (ref 20–32)
Calcium: 9.7 mg/dL (ref 8.6–10.4)
Chloride: 104 mmol/L (ref 98–110)
Creat: 0.8 mg/dL (ref 0.50–1.05)
GFR, Est African American: 95 mL/min/{1.73_m2} (ref >=60)
GFR, Est Non African American: 82 mL/min/{1.73_m2} (ref >=60)
Globulin: 2.4 g/dL (calc) (ref 1.9–3.7)
Glucose, Bld: 85 mg/dL (ref 65–99)
Potassium: 4.6 mmol/L (ref 3.5–5.3)
Sodium: 139 mmol/L (ref 135–146)
Total Bilirubin: 0.6 mg/dL (ref 0.2–1.2)
Total Protein: 6.7 g/dL (ref 6.1–8.1)

## 2020-03-10 LAB — CBC WITH DIFFERENTIAL/PLATELET
Absolute Monocytes: 302 cells/uL (ref 200–950)
Basophils Absolute: 29 cells/uL (ref 0–200)
Basophils Relative: 0.7 %
Eosinophils Absolute: 80 cells/uL (ref 15–500)
Eosinophils Relative: 1.9 %
HCT: 39.2 % (ref 35.0–45.0)
Hemoglobin: 13.2 g/dL (ref 11.7–15.5)
Lymphs Abs: 1079 cells/uL (ref 850–3900)
MCH: 29 pg (ref 27.0–33.0)
MCHC: 33.7 g/dL (ref 32.0–36.0)
MCV: 86.2 fL (ref 80.0–100.0)
MPV: 11.4 fL (ref 7.5–12.5)
Monocytes Relative: 7.2 %
Neutro Abs: 2709 cells/uL (ref 1500–7800)
Neutrophils Relative %: 64.5 %
Platelets: 293 10*3/uL (ref 140–400)
RBC: 4.55 10*6/uL (ref 3.80–5.10)
RDW: 12.6 % (ref 11.0–15.0)
Total Lymphocyte: 25.7 %
WBC: 4.2 10*3/uL (ref 3.8–10.8)

## 2020-03-10 LAB — URINALYSIS, ROUTINE W REFLEX MICROSCOPIC
Bilirubin Urine: NEGATIVE
Glucose, UA: NEGATIVE
Hgb urine dipstick: NEGATIVE
Nitrite: NEGATIVE
Protein, ur: NEGATIVE
Specific Gravity, Urine: 1.023 (ref 1.001–1.03)
pH: <=5 (ref 5.0–8.0)

## 2020-03-10 LAB — C3 AND C4
C3 Complement: 104 mg/dL (ref 83–193)
C4 Complement: 17 mg/dL (ref 15–57)

## 2020-03-10 LAB — SEDIMENTATION RATE: Sed Rate: 9 mm/h (ref 0–30)

## 2020-03-10 LAB — ANTI-DNA ANTIBODY, DOUBLE-STRANDED: ds DNA Ab: <1 IU/mL

## 2020-03-10 LAB — VITAMIN D 25 HYDROXY (VIT D DEFICIENCY, FRACTURES): Vit D, 25-Hydroxy: 40 ng/mL (ref 30–100)

## 2020-03-10 NOTE — Progress Notes (Signed)
CBC and CMP WNL. ESR and complements WNL.  DsDNA is pending.  Vitamin D is WNL-please recommend a maintenance dose of vitamin D.   UA revealed trace leukocytes and few bacteria.  Negative for nitrites.  Moderate calcium oxalate crystals also noted.  Please clarify if the patient has a history of kidney stones.  Please advise the patient to follow up with PCP to further evaluate.

## 2020-03-13 NOTE — Progress Notes (Signed)
DsDNA is negative. No change in therapy recommended.

## 2020-03-27 ENCOUNTER — Telehealth: Payer: Self-pay

## 2020-03-27 NOTE — Telephone Encounter (Signed)
Spoke with patient and advised that prescription was sent to pharmacy 01/27/20 #90 w/4 refills. Patient to check with pharmacy. Patient verbalizes understanding and is agreeable. Okay to close encounter.

## 2020-03-27 NOTE — Telephone Encounter (Signed)
Patient is calling in regards to refill for Premarin.

## 2020-04-25 ENCOUNTER — Other Ambulatory Visit: Payer: 59

## 2020-05-05 ENCOUNTER — Encounter: Payer: Self-pay | Admitting: Rheumatology

## 2020-05-05 NOTE — Telephone Encounter (Signed)
I spoke with the patient.  Her son will be in town on December 17.  Please to schedule an appointment for him on December 20.

## 2020-06-04 ENCOUNTER — Other Ambulatory Visit: Payer: Self-pay | Admitting: Physician Assistant

## 2020-06-04 DIAGNOSIS — M359 Systemic involvement of connective tissue, unspecified: Secondary | ICD-10-CM

## 2020-06-05 NOTE — Telephone Encounter (Signed)
Last Visit: 03/09/2020 Next Visit: 08/14/2020 Labs: 03/09/2020 CBC and CMP WNL Eye exam: 11/13/18  Current Dose per office note 03/09/2020: PLQ 200 mg 1 tablet by mouth daily DX: Autoimmune disease   Left message to advise patient she is due to update her PLQ eye exam.  Okay to refill Plaquenil?

## 2020-06-08 ENCOUNTER — Telehealth: Payer: Self-pay

## 2020-06-08 NOTE — Telephone Encounter (Signed)
Patient called stating she has tried 4 separate times to have her Plaquenil eye exam results faxed to our office.  Patient states she had her Plaquenil eye exam in May of 2021 with Dr. Joya San.  Patient is requesting our office assist her in obtaining her results.  Dr. Wende Crease [phone]

## 2020-06-08 NOTE — Telephone Encounter (Signed)
Contacted the eye doctor and spoke with Estill Bamberg. Estill Bamberg states she will fax the results over.

## 2020-06-19 ENCOUNTER — Other Ambulatory Visit: Payer: 59

## 2020-06-19 DIAGNOSIS — Z20822 Contact with and (suspected) exposure to covid-19: Secondary | ICD-10-CM

## 2020-06-21 LAB — SARS-COV-2, NAA 2 DAY TAT

## 2020-06-21 LAB — NOVEL CORONAVIRUS, NAA: SARS-CoV-2, NAA: NOT DETECTED

## 2020-06-29 ENCOUNTER — Other Ambulatory Visit: Payer: 59

## 2020-06-29 DIAGNOSIS — Z20822 Contact with and (suspected) exposure to covid-19: Secondary | ICD-10-CM

## 2020-06-30 LAB — SARS-COV-2, NAA 2 DAY TAT

## 2020-06-30 LAB — NOVEL CORONAVIRUS, NAA: SARS-CoV-2, NAA: NOT DETECTED

## 2020-07-25 ENCOUNTER — Ambulatory Visit
Admission: RE | Admit: 2020-07-25 | Discharge: 2020-07-25 | Disposition: A | Discharge status: Home / Self Care | Payer: 59 | Source: Ambulatory Visit | Attending: Obstetrics & Gynecology | Admitting: Obstetrics & Gynecology

## 2020-07-25 ENCOUNTER — Other Ambulatory Visit: Payer: Self-pay

## 2020-07-25 DIAGNOSIS — E2839 Other primary ovarian failure: Secondary | ICD-10-CM

## 2020-07-25 NOTE — Progress Notes (Signed)
DEXA scan shows osteopenia.  Plan to repeat DEXA in 2 years.  Patient should take dietary and supplement calcium equal to 1200 mg a day with vitamin D.  Encourage resistive exercises.

## 2020-07-31 NOTE — Progress Notes (Deleted)
Office Visit Note  Patient: Alexandra Garner             Date of Birth: 05/05/1961           MRN: [id number]             PCP: Leamon Arnt, MD Referring: Leamon Arnt, MD Visit Date: 08/14/2020 Occupation: @GUAROCC @  Subjective:  No chief complaint on file.   History of Present Illness: Alexandra Garner is a 60 y.o. female ***   Activities of Daily Living:  Patient reports morning stiffness for *** {minute/hour:19697}.   Patient {ACTIONS;DENIES/REPORTS:21021675::"Denies"} nocturnal pain.  Difficulty dressing/grooming: {ACTIONS;DENIES/REPORTS:21021675::"Denies"} Difficulty climbing stairs: {ACTIONS;DENIES/REPORTS:21021675::"Denies"} Difficulty getting out of chair: {ACTIONS;DENIES/REPORTS:21021675::"Denies"} Difficulty using hands for taps, buttons, cutlery, and/or writing: {ACTIONS;DENIES/REPORTS:21021675::"Denies"}  No Rheumatology ROS completed.   PMFS History:  Patient Active Problem List   Diagnosis Date Noted  . 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 Palpitations 08/24/2018  . Dyshidrotic eczema 11/14/2017  . Antiphospholipid syndrome ([place]) 11/14/2017  . 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 Hormone replacement therapy (HRT) 11/14/2017  . History of migraine 11/07/2017  . Autoimmune disease ([place]) 10/20/2016  . High risk medication use 09/26/2016  . Raynaud's disease without gangrene 09/19/2016  . Primary osteoarthritis of both feet 09/19/2016  . Family history of macular degeneration 02/03/2015  . Primary localized osteoarthrosis of hand 12/23/2013  . History of colonic polyps 11/01/2008    Past Medical History:  Diagnosis Date  . 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  . 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 Fibroid   . Hand pain    left hand-had 2 cortisone injections  . Hx of migraines   . 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 Post-operative nausea and vomiting   . RA (rheumatoid arthritis) ([facility])     Family History  Problem  Relation Age of Onset  . Clotting disorder Mother   . Prostate cancer Father   . Colon polyps Father   . Cervical cancer Maternal Grandmother   . Healthy Daughter    Past Surgical History:  Procedure Laterality Date  . ABDOMINAL HYSTERECTOMY    . AUGMENTATION MAMMAPLASTY    . BREAST SURGERY    . BUTTOCK MASS EXCISION Left 08/26/2019   Dr. Janice Coffin  . COLONOSCOPY  03/28/2008  . PELVIC LAPAROSCOPY    . REDUCTION MAMMAPLASTY     Social History   Social History Narrative  . Not on file   Immunization History  Administered Date(s) Administered  . PFIZER([place])SARS-COV-2 Vaccination 07/20/2019, 08/11/2019  . Tdap 10/01/2011  . Zoster Recombinat (Shingrix) 04/15/2019, 06/17/2019     Objective: Vital Signs: LMP 10/11/2012    Physical Exam   Musculoskeletal Exam: ***  CDAI Exam: CDAI Score: -- Patient Global: --; Provider Global: -- Swollen: --; Tender: -- Joint Exam 08/14/2020   No joint exam has been documented for this visit   There is currently no information documented on the homunculus. Go to the Rheumatology activity and complete the homunculus joint exam.  Investigation: No additional findings.  Imaging: DG BONE DENSITY (DXA)  Result Date: 07/25/2020 EXAM: DUAL X-RAY ABSORPTIOMETRY (DXA) FOR BONE MINERAL DENSITY IMPRESSION: Referring Physician:  [name] Your patient completed a BMD test using Lunar IDXA DXA system ( analysis version: 16 ) manufactured by [employer]. Technologist: AD PATIENT: Name: Alexandra Garner Patient ID: [id number] Birth Date: 10/21/1960 Height: 65.5 in. Sex: Female Measured: 07/25/2020 Weight: 138.8 lbs. Indications: Caucasian, Estrogen Deficient,  Hysterectomy, Lupus, Postmenopausal, Rheumatoid Arthritis (714.0) Fractures: None Treatments: Premarin, Vitamin D (E933.5) ASSESSMENT: The BMD measured at AP Spine L1-L2 is 0.962 g/cm2 with a T-score of -1.7. This patient is considered osteopenic according to World Health Organization  [facility]) criteria. The scan quality is good. L-3 and L-4 were excluded due to degenerative changes. Site Region Measured Date Measured Age YA BMD Significant CHANGE T-score AP Spine  L1-L2      8/25/[age]    58.1         -1.7    0.962 g/cm2 DualFemur Neck Left  07/25/2020    58.1         -1.0    0.900 g/cm2 DualFemur Total Mean 8/25/2020    58.1         -0.5    0.943 g/cm2 World Health Organization [facility]) criteria for post-menopausal, Caucasian Women: Normal       T-score at or above -1 SD Osteopenia   T-score between -1 and -2.5 SD Osteoporosis T-score at or below -2.5 SD RECOMMENDATION: 1. All patients should optimize calcium and vitamin D intake. 2. Consider FDA approved medical therapies in postmenopausal women and men aged 64 years and older, based on the following: a. A hip or vertebral (clinical or morphometric) fracture b. T- score < or = -2.5 at the femoral neck or spine after appropriate evaluation to exclude secondary causes c. Low bone mass (T-score between -1.0 and -2.5 at the femoral neck or spine) and a 10 year probability of a hip fracture > or = 3% or a 10 year probability of a major osteoporosis-related fracture > or = 20% based on the [place]-adapted WHO algorithm d. Clinician judgment and/or patient preferences may indicate treatment for people with 10-year fracture probabilities above or below these levels FOLLOW-UP: Patients with diagnosis of osteoporosis or at high risk for fracture should have regular bone mineral density tests. For patients eligible for Medicare, routine testing is allowed once every 2 years. The testing frequency can be increased to one year for patients who have rapidly progressing disease, those who are receiving or discontinuing medical therapy to restore bone mass, or have additional risk factors. FRAX* 10-year Probability of Fracture Based on femoral neck BMD: DualFemur (Left) Major Osteoporotic Fracture: 8.0% Hip Fracture:                0.5% Population:                  [place]  (Caucasian) Risk Factors:                Rheumatoid Arthritis (714.0) *FRAX is a Armed forces logistics/support/administrative officer of the [employer] of [employer] for Metabolic Bone Disease, a World Science writer (WHO) [employer]. ASSESSMENT: The probability of a major osteoporotic fracture is 8.0% within the next ten years. The probability of a hip fracture is 0.5% within the next ten years. Electronically Signed   By: Danae Orleans M.D.   On: 07/25/2020 08:19    Recent Labs: Lab Results  Component Value Date   WBC 4.2 03/09/2020   HGB 13.2 03/09/2020   PLT 293 03/09/2020   NA 139 03/09/2020   K 4.6 03/09/2020   CL 104 03/09/2020   CO2 27 03/09/2020   GLUCOSE 85 03/09/2020   BUN 10 03/09/2020   CREATININE 0.80 03/09/2020   BILITOT 0.6 03/09/2020   ALKPHOS 33 (L) 01/10/2020   AST 17 03/09/2020   ALT 13 03/09/2020   PROT 6.7 03/09/2020  ALBUMIN 3.9 01/10/2020   CALCIUM 9.7 03/09/2020   GFRAA 95 03/09/2020    Speciality Comments: Plaquenil eye exam: normal on 11/08/2019 WNL per [facility] Follow up in 1 year.   Procedures:  No procedures performed Allergies: Penicillins, Adhesive [tape], and Latex   Assessment / Plan:     Visit Diagnoses: Autoimmune disease ([place])  High risk medication use  Raynaud's disease without gangrene  Antiphospholipid antibody positive  Positive cardiolipin antibodies, positive b2 GP1  Primary osteoarthritis of both hands  Primary osteoarthritis of both feet  Trochanteric bursitis of both hips  DDD (degenerative disc disease), cervical  DDD (degenerative disc disease), lumbar  Pitting edema  History of migraine  History of colonic polyps  Vitamin D deficiency  Myxoma  Other fatigue  Orders: No orders of the defined types were placed in this encounter.  No orders of the defined types were placed in this encounter.   Face-to-face time spent with patient was *** minutes. Greater than 50% of time was spent in counseling  and coordination of care.  Follow-Up Instructions: No follow-ups on file.   Ofilia Neas, PA-C  Note - This record has been created using Dragon software.  Chart creation errors have been sought, but may not always  have been located. Such creation errors do not reflect on  the standard of medical care.

## 2020-08-14 ENCOUNTER — Ambulatory Visit: Payer: 59 | Admitting: Physician Assistant

## 2020-08-14 DIAGNOSIS — M5136 Other intervertebral disc degeneration, lumbar region: Secondary | ICD-10-CM

## 2020-08-14 DIAGNOSIS — E559 Vitamin D deficiency, unspecified: Secondary | ICD-10-CM

## 2020-08-14 DIAGNOSIS — R5383 Other fatigue: Secondary | ICD-10-CM

## 2020-08-14 DIAGNOSIS — M7062 Trochanteric bursitis, left hip: Secondary | ICD-10-CM

## 2020-08-14 DIAGNOSIS — R760 Raised antibody titer: Secondary | ICD-10-CM

## 2020-08-14 DIAGNOSIS — M5030 Other cervical disc degeneration, unspecified cervical region: Secondary | ICD-10-CM

## 2020-08-14 DIAGNOSIS — Z8669 Personal history of other diseases of the nervous system and sense organs: Secondary | ICD-10-CM

## 2020-08-14 DIAGNOSIS — M359 Systemic involvement of connective tissue, unspecified: Secondary | ICD-10-CM

## 2020-08-14 DIAGNOSIS — M19071 Primary osteoarthritis, right ankle and foot: Secondary | ICD-10-CM

## 2020-08-14 DIAGNOSIS — M8588 Other specified disorders of bone density and structure, other site: Secondary | ICD-10-CM

## 2020-08-14 DIAGNOSIS — I7300 Raynaud's syndrome without gangrene: Secondary | ICD-10-CM

## 2020-08-14 DIAGNOSIS — D219 Benign neoplasm of connective and other soft tissue, unspecified: Secondary | ICD-10-CM

## 2020-08-14 DIAGNOSIS — M19041 Primary osteoarthritis, right hand: Secondary | ICD-10-CM

## 2020-08-14 DIAGNOSIS — Z86010 Personal history of colonic polyps: Secondary | ICD-10-CM

## 2020-08-14 DIAGNOSIS — R609 Edema, unspecified: Secondary | ICD-10-CM

## 2020-08-14 DIAGNOSIS — Z79899 Other long term (current) drug therapy: Secondary | ICD-10-CM

## 2020-08-15 NOTE — Progress Notes (Signed)
Office Visit Note  Patient: Alexandra Garner             Date of Birth: Aug 12, 1960           MRN: [id number]             PCP: Willow Ora, MD Referring: Willow Ora, MD Visit Date: 08/29/2020 Occupation: @GUAROCC @  Subjective:  Joint stiffness   History of Present Illness: Alexandra Garner is a 60 y.o. female with history of autoimmune disease, osteoarthritis, and DDD.  She is taking plaquenil 200 mg 1 tablet by mouth daily.  She continues to tolerate Plaquenil without any side effects.  She denies any signs or symptoms of a flare recently.  She states that she has been experiencing some increased discomfort and stiffness in her lower back, both hips, and both knee joints.  She has been exercising on the elliptical as well as walking on a regular basis for exercise.  She has an upcoming appointment with her orthopedist on 09/19/2019 to discuss repeating an MRI of the left hip as recommended.  She states that she has been using a foam roller on a regular basis which has alleviated some of her discomfort.  She denies any joint swelling currently.  She has not had any recent rashes.  She has intermittent symptoms of Raynaud's but denies any digital ulcerations or signs of gangrene.  She continues to take aspirin 81 mg 2 tablets daily.  She denies any oral or nasal ulcers. She has ongoing sicca symptoms which have been stable.    She reports she had an updated DEXA in February 2022.  She has been taking vitamin D 2000 units daily.  She reports that she tried taking calcium supplement but developed severe constipation and discontinued.  She has been trying to eat kale as well as increasing her dairy intake.  She denies any recent falls or fractures.     Activities of Daily Living:  Patient reports morning stiffness for 20-30 minutes.   Patient Reports nocturnal pain.  Difficulty dressing/grooming: Denies Difficulty climbing stairs: Denies Difficulty getting out of chair: Denies Difficulty using  hands for taps, buttons, cutlery, and/or writing: Reports  Review of Systems  Constitutional: Negative for fatigue.  HENT: Positive for nose dryness. Negative for mouth sores and mouth dryness.   Eyes: Positive for dryness. Negative for pain and itching.  Respiratory: Negative for shortness of breath and difficulty breathing.   Cardiovascular: Negative for chest pain and palpitations.  Gastrointestinal: Negative for blood in stool, constipation and diarrhea.  Endocrine: Negative for increased urination.  Genitourinary: Negative for difficulty urinating.  Musculoskeletal: Positive for morning stiffness. Negative for arthralgias, joint pain, joint swelling, myalgias, muscle tenderness and myalgias.  Skin: Positive for color change. Negative for rash and redness.  Allergic/Immunologic: Negative for susceptible to infections.  Neurological: Positive for numbness. Negative for dizziness, headaches, memory loss and weakness.  Hematological: Negative for bruising/bleeding tendency.  Psychiatric/Behavioral: Negative for confusion.    PMFS History:  Patient Active Problem List   Diagnosis Date Noted  . 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 Palpitations 08/24/2018  . Dyshidrotic eczema 11/14/2017  . Antiphospholipid syndrome (HCC) 11/14/2017  . 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 Hormone replacement therapy (HRT) 11/14/2017  . History of migraine 11/07/2017  . Autoimmune disease (HCC) 10/20/2016  . High risk medication use 09/26/2016  . Raynaud's disease without gangrene 09/19/2016  . Primary osteoarthritis of both feet 09/19/2016  . Family history of macular degeneration 02/03/2015  .  Primary localized osteoarthrosis of hand 12/23/2013  . History of colonic polyps 11/01/2008    Past Medical History:  Diagnosis Date  . 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  . 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 Fibroid   . Hand pain    left hand-had 2 cortisone injections  . Hx of migraines    . 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 Post-operative nausea and vomiting   . RA (rheumatoid arthritis) (HCC)     Family History  Problem Relation Age of Onset  . Clotting disorder Mother   . Prostate cancer Father   . Colon polyps Father   . Cervical cancer Maternal Grandmother   . Healthy Daughter    Past Surgical History:  Procedure Laterality Date  . ABDOMINAL HYSTERECTOMY    . AUGMENTATION MAMMAPLASTY    . BREAST SURGERY    . BUTTOCK MASS EXCISION Left 08/26/2019   Dr. Toy Baker  . COLONOSCOPY  03/28/2008  . PELVIC LAPAROSCOPY    . REDUCTION MAMMAPLASTY     Social History   Social History Narrative  . Not on file   Immunization History  Administered Date(s) Administered  . PFIZER(Purple Top)SARS-COV-2 Vaccination 07/20/2019, 08/11/2019, 03/30/2020  . Tdap 10/01/2011  . Zoster Recombinat (Shingrix) 04/15/2019, 06/17/2019     Objective: Vital Signs: BP 118/76 (BP Location: Left Arm, Patient Position: Sitting, Cuff Size: Normal)   Pulse 61   Resp 13   Ht 5\' 6"  (1.676 m)   Wt 141 lb 9.6 oz (64.2 kg)   LMP 10/11/2012   BMI 22.85 kg/m    Physical Exam Vitals and nursing note reviewed.  Constitutional:      Appearance: She is well-developed and well-nourished.  HENT:     Head: Normocephalic and atraumatic.  Eyes:     Extraocular Movements: EOM normal.     Conjunctiva/sclera: Conjunctivae normal.  Cardiovascular:     Pulses: Intact distal pulses.  Pulmonary:     Effort: Pulmonary effort is normal.  Abdominal:     Palpations: Abdomen is soft.  Musculoskeletal:     Cervical back: Normal range of motion.  Skin:    General: Skin is warm and dry.     Capillary Refill: Capillary refill takes less than 2 seconds.  Neurological:     Mental Status: She is alert and oriented to person, place, and time.  Psychiatric:        Mood and Affect: Mood and affect normal.        Behavior: Behavior normal.      Musculoskeletal Exam:  C-spine, thoracic spine, lumbar spine have good range of motion with no discomfort.  No midline spinal tenderness or SI joint tenderness.  Shoulder joints, elbow joints, wrist joints, MCPs, PIPs, DIPs have good range of motion with no synovitis.  Mild PIP and DIP thickening consistent with osteoarthritis of both hands.  She is able to make a complete fist bilaterally.  Objective good range of motion with some discomfort in the left hip.  Tenderness over the left trochanteric bursa and left piriformis muscle.  Knee joints have good range of motion with no warmth or effusion.  Ankle joints have good range of motion with no tenderness or inflammation.   CDAI Exam: CDAI Score: -- Patient Global: --; Provider Global: -- Swollen: --; Tender: -- Joint Exam 08/29/2020   No joint exam has been documented for this visit   There is currently no information documented on  the homunculus. Go to the Rheumatology activity and complete the homunculus joint exam.  Investigation: No additional findings.  Imaging: No results found.  Recent Labs: Lab Results  Component Value Date   WBC 4.2 03/09/2020   HGB 13.2 03/09/2020   PLT 293 03/09/2020   NA 139 03/09/2020   K 4.6 03/09/2020   CL 104 03/09/2020   CO2 27 03/09/2020   GLUCOSE 85 03/09/2020   BUN 10 03/09/2020   CREATININE 0.80 03/09/2020   BILITOT 0.6 03/09/2020   ALKPHOS 33 (L) 01/10/2020   AST 17 03/09/2020   ALT 13 03/09/2020   PROT 6.7 03/09/2020   ALBUMIN 3.9 01/10/2020   CALCIUM 9.7 03/09/2020   GFRAA 95 03/09/2020    Speciality Comments: Plaquenil eye exam: normal on 11/08/2019 WNL per [facility] Follow up in 1 year.   Procedures:  No procedures performed Allergies: Penicillins, Adhesive [tape], and Latex   Assessment / Plan:     Visit Diagnoses: Autoimmune disease ([place]) - History of arthralgias, [name], photosensitivity, positive aCL IgM46, positive beta 2 IgM 100: She has not had any signs or symptoms of a flare  recently.  She is clinically doing well taking Plaquenil 200 mg 1 tablet by mouth daily.  She continues to tolerate Plaquenil without any side effects and has not missed any doses recently.  Lab work from 03/09/20 was reviewed with the patient today in the office: dsDNA negative, complements WNL, and ESR WNL.  She is due to update autoimmune lab work today.  She has been experiencing increased arthralgias and joint stiffness especially in her lower back, both hips, and both knee joints.  Both knees have good range of motion with no warmth or effusion on exam.  No synovitis was noted.  She has not had any recent rashes and continues to see her dermatologist at [facility] dermatology on a regular basis.  She has had intermittent symptoms of Raynaud's with the cooler weather temperatures but no digital ulcerations or signs of gangrene were noted.  She continues to take aspirin 81 mg 2 tablets daily.  She has not had any oral or nasal ulcerations.  She continues to have chronic sicca symptoms which have been tolerable.  She will continue taking Plaquenil as prescribed.  A refill was sent to the pharmacy today.  She was also given a list of natural anti-inflammatories to start taking to try to increase her arthralgias and joint stiffness.  She was advised to notify us if she develops any new or worsening symptoms.  She will follow-up in the office in 5 months.- Plan: CBC with Differential/Platelet, COMPLETE METABOLIC PANEL WITH GFR, Urinalysis, Routine w reflex microscopic, Anti-DNA antibody, double-stranded, C3 and C4, Sedimentation rate, Cardiolipin antibodies, IgG, IgM, IgA, Beta-2 glycoprotein antibodies, ANA, Lupus Anticoagulant Eval w/Reflex  High risk medication use - Plaquenil 200 mg 1 tablet by mouth daily. PLQ eye exam: 11/08/2019.  CBC and CMP were within normal limits on 03/09/2020.  She is due to update lab work today.  Orders for CBC and CMP were released.- Plan: CBC with Differential/Platelet, COMPLETE  METABOLIC PANEL WITH GFR  Raynaud's disease without gangrene: She continues to experience intermittent symptoms of Raynaud's in her fingers and toes.  She has not had any digital ulcerations or signs of gangrene.  She has good capillary refill on examination today.  She continues to take aspirin 162 mg daily.  Antiphospholipid antibody positive - She takes aspirin 162 mg po daily.  We will recheck the following lab  work today.- Plan: Cardiolipin antibodies, IgG, IgM, IgA, Beta-2 glycoprotein antibodies, ANA, Lupus Anticoagulant Eval w/Reflex  Positive cardiolipin antibodies, positive b2 GP1 - She takes aspirin 162 mg po daily.  Anticardiolipin antibodies will be rechecked today.- Plan: Cardiolipin antibodies, IgG, IgM, IgA  Primary osteoarthritis of both hands: She has mild PIP and DIP thickening consistent with osteoarthritis of both hands.  No joint tenderness or inflammation was noted.  She is able to make a complete fist bilaterally.  We discussed the importance of joint protection and muscle strengthening.  She was given a list of osteoarthritis natural anti-inflammatories to start taking.  Primary osteoarthritis of both feet: She is not experiencing any discomfort in her feet at this time.  She wears proper fitting shoes.  Ankle joints have good range of motion with no tenderness or inflammation.  Trochanteric bursitis of both hips: She experiences intermittent discomfort due to trochanter bursitis of both hips.  She has tenderness palpation over the left trochanteric bursa on examination today.  We discussed the importance of performing stretching exercises on a daily basis.  DDD (degenerative disc disease), cervical - MRI cervical spine multilevel DDD and spondylosis with moderate neuroforaminal narrowing.  She has good range of motion of the C-spine with no discomfort.  DDD (degenerative disc disease), lumbar -  MRI lumbar spine on 05/12/19-L5-S1 disc bulge and mild to moderate neural  foraminal narrowing.  She continues to experience intermittent discomfort and stiffness in her lower back.  We discussed the importance of performing stretching exercises on a regular basis.  She has been using a foam roller on a regular basis.  Osteopenia of multiple sites - DEXA 07/25/2020 The BMD measured at [facility] L1-L2 is 0.962 g/cm2 with a T-score of -1.7.  She has not had any recent falls or fractures.  She has been taking vitamin D 2000 units daily.  She tried taking an over-the-counter calcium supplement but developed constipation and discontinued.  She was given a informational handout about calcium rich foods which she can obtain through her diet.  We discussed the recommended consumption of calcium is 400 mg 3 times daily.   Vitamin D deficiency: She is taking vitamin D 2000 units daily.  Pitting edema: Unchanged.   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  Follow-up scheduled with her orthopedist on 09/19/2019 to discuss repeat MRI of the left hip as recommended on a yearly basis for the next 5 years.  Tinnitus, bilateral: She has been experiencing tinnitus in both ears for the past 1 year.  She was evaluated by an audiologist but has had no further work-up.  We discussed a referral to neurology but she plans on having her PCP placed the referral. We discussed that tinnitus is a very rare symptom of Plaquenil use.  Other medical conditions are listed as follows:  History of migraine  History of colonic polyps  Other fatigue  Orders: Orders Placed This Encounter  Procedures  . CBC with Differential/Platelet  . COMPLETE METABOLIC PANEL WITH GFR  . Urinalysis, Routine w reflex microscopic  . Anti-DNA antibody, double-stranded  . C3 and C4  . Sedimentation rate  . Cardiolipin antibodies, IgG, IgM, IgA  . Beta-2 glycoprotein antibodies  . ANA  . Lupus Anticoagulant Eval w/Reflex   No orders of the defined types were placed in this encounter.    Follow-Up  Instructions: Return in about 5 months (around 01/29/2021) for Autoimmune Disease.   Gearldine Bienenstock, PA-C  Note - This record has  been created using [employer].  Chart creation errors have been sought, but may not always  have been located. Such creation errors do not reflect on  the standard of medical care.

## 2020-08-29 ENCOUNTER — Other Ambulatory Visit: Payer: Self-pay

## 2020-08-29 ENCOUNTER — Ambulatory Visit: Payer: 59 | Admitting: Physician Assistant

## 2020-08-29 ENCOUNTER — Other Ambulatory Visit: Payer: Self-pay | Admitting: Physician Assistant

## 2020-08-29 ENCOUNTER — Encounter: Payer: Self-pay | Admitting: Physician Assistant

## 2020-08-29 VITALS — BP 118/76 | HR 61 | Resp 13 | Ht 66.0 in | Wt 141.6 lb

## 2020-08-29 DIAGNOSIS — M8589 Other specified disorders of bone density and structure, multiple sites: Secondary | ICD-10-CM

## 2020-08-29 DIAGNOSIS — M19071 Primary osteoarthritis, right ankle and foot: Secondary | ICD-10-CM

## 2020-08-29 DIAGNOSIS — Z79899 Other long term (current) drug therapy: Secondary | ICD-10-CM | POA: Diagnosis not present

## 2020-08-29 DIAGNOSIS — Z860100 Personal history of colon polyps, unspecified: Secondary | ICD-10-CM

## 2020-08-29 DIAGNOSIS — R5383 Other fatigue: Secondary | ICD-10-CM

## 2020-08-29 DIAGNOSIS — R760 Raised antibody titer: Secondary | ICD-10-CM | POA: Diagnosis not present

## 2020-08-29 DIAGNOSIS — Z13820 Encounter for screening for osteoporosis: Secondary | ICD-10-CM

## 2020-08-29 DIAGNOSIS — M51369 Other intervertebral disc degeneration, lumbar region without mention of lumbar back pain or lower extremity pain: Secondary | ICD-10-CM

## 2020-08-29 DIAGNOSIS — H9313 Tinnitus, bilateral: Secondary | ICD-10-CM

## 2020-08-29 DIAGNOSIS — Z8669 Personal history of other diseases of the nervous system and sense organs: Secondary | ICD-10-CM

## 2020-08-29 DIAGNOSIS — M5136 Other intervertebral disc degeneration, lumbar region: Secondary | ICD-10-CM

## 2020-08-29 DIAGNOSIS — M5030 Other cervical disc degeneration, unspecified cervical region: Secondary | ICD-10-CM

## 2020-08-29 DIAGNOSIS — M359 Systemic involvement of connective tissue, unspecified: Secondary | ICD-10-CM | POA: Diagnosis not present

## 2020-08-29 DIAGNOSIS — M19041 Primary osteoarthritis, right hand: Secondary | ICD-10-CM

## 2020-08-29 DIAGNOSIS — R609 Edema, unspecified: Secondary | ICD-10-CM

## 2020-08-29 DIAGNOSIS — M7061 Trochanteric bursitis, right hip: Secondary | ICD-10-CM

## 2020-08-29 DIAGNOSIS — M19042 Primary osteoarthritis, left hand: Secondary | ICD-10-CM

## 2020-08-29 DIAGNOSIS — M7062 Trochanteric bursitis, left hip: Secondary | ICD-10-CM

## 2020-08-29 DIAGNOSIS — D219 Benign neoplasm of connective and other soft tissue, unspecified: Secondary | ICD-10-CM

## 2020-08-29 DIAGNOSIS — I7300 Raynaud's syndrome without gangrene: Secondary | ICD-10-CM | POA: Diagnosis not present

## 2020-08-29 DIAGNOSIS — M19072 Primary osteoarthritis, left ankle and foot: Secondary | ICD-10-CM

## 2020-08-29 DIAGNOSIS — E559 Vitamin D deficiency, unspecified: Secondary | ICD-10-CM

## 2020-08-29 NOTE — Telephone Encounter (Signed)
Last Visit: 03/09/2020 Next Visit: 08/29/2020 Labs: 3/72022 pending Eye exam: 11/08/2019  Currentt Dose per office note 03/09/2020, PLQ 200 mg 1 tablet by mouth daily. DX: Autoimmune disease   Last Fill: 06/05/2020  Okay to refill Plaquenil?

## 2020-08-30 NOTE — Progress Notes (Signed)
UA trace protein and ketones.  Please add protein creatinine ratio.  CBC and CMP WNL.  Complements and ESR WNL.

## 2020-08-30 NOTE — Progress Notes (Signed)
dsDNA negative. Beta-2 glycoprotein Igm and anticardiolipin IgM remain positive-titers are stable.    ANA and LA pending.

## 2020-08-31 NOTE — Progress Notes (Signed)
Protein creatinine ratio WNL

## 2020-09-01 LAB — ANTI-NUCLEAR AB-TITER (ANA TITER): ANA Titer 1: 1:40 {titer} — ABNORMAL HIGH

## 2020-09-01 LAB — COMPLETE METABOLIC PANEL WITH GFR
AG Ratio: 2 (calc) (ref 1.0–2.5)
ALT: 16 U/L (ref 6–29)
AST: 17 U/L (ref 10–35)
Albumin: 4.3 g/dL (ref 3.6–5.1)
Alkaline phosphatase (APISO): 37 U/L (ref 37–153)
BUN: 11 mg/dL (ref 7–25)
CO2: 26 mmol/L (ref 20–32)
Calcium: 9.6 mg/dL (ref 8.6–10.4)
Chloride: 105 mmol/L (ref 98–110)
Creat: 0.77 mg/dL (ref 0.50–1.05)
GFR, Est African American: 99 mL/min/{1.73_m2} (ref >=60)
GFR, Est Non African American: 85 mL/min/{1.73_m2} (ref >=60)
Globulin: 2.2 g/dL (calc) (ref 1.9–3.7)
Glucose, Bld: 71 mg/dL (ref 65–99)
Potassium: 4.3 mmol/L (ref 3.5–5.3)
Sodium: 140 mmol/L (ref 135–146)
Total Bilirubin: 0.5 mg/dL (ref 0.2–1.2)
Total Protein: 6.5 g/dL (ref 6.1–8.1)

## 2020-09-01 LAB — CBC WITH DIFFERENTIAL/PLATELET
Absolute Monocytes: 388 cells/uL (ref 200–950)
Basophils Absolute: 31 cells/uL (ref 0–200)
Basophils Relative: 0.6 %
Eosinophils Absolute: 92 cells/uL (ref 15–500)
Eosinophils Relative: 1.8 %
HCT: 38 % (ref 35.0–45.0)
Hemoglobin: 12.7 g/dL (ref 11.7–15.5)
Lymphs Abs: 1336 cells/uL (ref 850–3900)
MCH: 28.5 pg (ref 27.0–33.0)
MCHC: 33.4 g/dL (ref 32.0–36.0)
MCV: 85.4 fL (ref 80.0–100.0)
MPV: 11.9 fL (ref 7.5–12.5)
Monocytes Relative: 7.6 %
Neutro Abs: 3254 cells/uL (ref 1500–7800)
Neutrophils Relative %: 63.8 %
Platelets: 254 10*3/uL (ref 140–400)
RBC: 4.45 10*6/uL (ref 3.80–5.10)
RDW: 12.4 % (ref 11.0–15.0)
Total Lymphocyte: 26.2 %
WBC: 5.1 10*3/uL (ref 3.8–10.8)

## 2020-09-01 LAB — CARDIOLIPIN ANTIBODIES, IGG, IGM, IGA
Anticardiolipin IgA: <2 APL-U/mL
Anticardiolipin IgG: 2.8 GPL-U/mL
Anticardiolipin IgM: 23.7 MPL-U/mL — ABNORMAL HIGH

## 2020-09-01 LAB — BETA-2 GLYCOPROTEIN ANTIBODIES
Beta-2 Glyco 1 IgA: <2 U/mL
Beta-2 Glyco 1 IgM: 36.1 U/mL — ABNORMAL HIGH
Beta-2 Glyco I IgG: 3.7 U/mL

## 2020-09-01 LAB — PROTEIN / CREATININE RATIO, URINE
Creatinine, Urine: 229 mg/dL (ref 20–275)
Protein/Creat Ratio: 70 mg/g creat (ref 21–161)
Protein/Creatinine Ratio: 0.07 mg/mg creat (ref 0.021–0.16)
Total Protein, Urine: 16 mg/dL (ref 5–24)

## 2020-09-01 LAB — RFX DRVVT 1:1 MIX

## 2020-09-01 LAB — URINALYSIS, ROUTINE W REFLEX MICROSCOPIC
Bacteria, UA: NONE SEEN /HPF
Bilirubin Urine: NEGATIVE
Glucose, UA: NEGATIVE
Hgb urine dipstick: NEGATIVE
Hyaline Cast: NONE SEEN /LPF
Nitrite: NEGATIVE
Specific Gravity, Urine: 1.031 (ref 1.001–1.03)
WBC, UA: NONE SEEN /HPF (ref 0–5)
pH: <=5 (ref 5.0–8.0)

## 2020-09-01 LAB — TEST AUTHORIZATION

## 2020-09-01 LAB — ANTI-DNA ANTIBODY, DOUBLE-STRANDED: ds DNA Ab: <1 IU/mL

## 2020-09-01 LAB — RFLX DRVVT CONFRIM: DRVVT CONFIRM: POSITIVE — AB

## 2020-09-01 LAB — C3 AND C4
C3 Complement: 98 mg/dL (ref 83–193)
C4 Complement: 16 mg/dL (ref 15–57)

## 2020-09-01 LAB — LUPUS ANTICOAGULANT EVAL W/ REFLEX
PTT-LA Screen: 42 s — ABNORMAL HIGH (ref <=40)
dRVVT: 53 s — ABNORMAL HIGH (ref <=45)

## 2020-09-01 LAB — SEDIMENTATION RATE: Sed Rate: 11 mm/h (ref 0–30)

## 2020-09-01 LAB — THROMBIN CLOTTING TIME: Thrombin Clotting Time: 15 s (ref 13–19)

## 2020-09-01 LAB — ANA: Anti Nuclear Antibody (ANA): POSITIVE — AB

## 2020-09-01 LAB — RFLX HEXAGONAL PHASE CONFIRM: Hexagonal Phase Conf: POSITIVE — AB

## 2020-09-04 NOTE — Progress Notes (Signed)
All the labs are stable.  Lupus anticoagulant is positive which has been negative in the past.  We will repeat the lupus anticoagulant in 3 months.

## 2020-09-05 NOTE — Addendum Note (Signed)
Addended by: Shona Needles on: 09/05/2020 08:40 AM   Modules accepted: Orders

## 2020-11-27 ENCOUNTER — Encounter: Payer: Self-pay | Admitting: Rheumatology

## 2020-11-30 ENCOUNTER — Other Ambulatory Visit: Payer: Self-pay | Admitting: Physician Assistant

## 2020-11-30 DIAGNOSIS — M359 Systemic involvement of connective tissue, unspecified: Secondary | ICD-10-CM

## 2020-11-30 NOTE — Telephone Encounter (Signed)
Last Visit: 08/29/2020 Next Visit: 02/02/2021 Labs: 08/29/2020 CBC and CMP WNL.  Eye exam:  11/08/2019 WNL   Current Dose per office note 08/29/2020: Plaquenil 200 mg 1 tablet by mouth daily. DX:  Autoimmune disease  Last Fill: 08/29/2020  Patient advised she is due to update her PLQ eye exam and will update in July 2022.   Okay to refill Plaquenil?

## 2020-12-20 ENCOUNTER — Ambulatory Visit: Payer: 59 | Admitting: Family Medicine

## 2020-12-20 ENCOUNTER — Other Ambulatory Visit: Payer: Self-pay

## 2020-12-20 ENCOUNTER — Encounter: Payer: Self-pay | Admitting: Family Medicine

## 2020-12-20 VITALS — BP 100/68 | HR 59 | Temp 98.7°F | Ht 66.0 in | Wt 138.0 lb

## 2020-12-20 DIAGNOSIS — H9202 Otalgia, left ear: Secondary | ICD-10-CM

## 2020-12-20 NOTE — Progress Notes (Signed)
Subjective  CC:  Chief Complaint  Patient presents with   Ear Pain    Left; starting over the weekend. She complains of sharp pain after her daily walks in the morning. Lasting about 30 min. She denies headache, or dizziness.     HPI: Alexandra Garner is a 60 y.o. female who presents to the office today to address the problems listed above in the chief complaint. Left ear pressure after walking in the am for the last 3 days; starts when she lies down to do her stretching but c/o fullness or "feeling off" for much of the remainder of the day. No pain. No uri sxs. Perhaps some allergies. No cough. No headaches. Awakens feeling well.    Assessment  1. Left ear pain      Plan  Left ear pain:  possibly related to ETD or allergies. Trial of zyrtec/flonase and monitor.   Follow up: cpe due  Visit date not found  No orders of the defined types were placed in this encounter.  No orders of the defined types were placed in this encounter.     I reviewed the patients updated PMH, FH, and SocHx.    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    aspirin 81 MG tablet Take 81 mg by mouth 2 (two) times daily.    estrogens, conjugated, (PREMARIN) 0.625 MG tablet TAKE 1 TABLET (0.625 MG TOTAL) BY MOUTH DAILY.   hydroxychloroquine (PLAQUENIL) 200 MG tablet TAKE 1 TABLET BY MOUTH EVERY DAY   ibuprofen (ADVIL) 400 MG tablet Take by mouth as needed.    Multiple Vitamins-Minerals (PRESERVISION AREDS) CAPS Take 1 tablet by mouth daily.    Vitamin D, Cholecalciferol, 25 MCG (1000 UT) TABS Take 2 tablets by mouth daily.     Allergies: Patient is allergic to penicillins, adhesive [tape], and latex. Family History: Patient family history includes Cervical cancer in her maternal grandmother; Clotting disorder in her mother; Colon polyps in her father; Healthy in her daughter; Prostate cancer in her father. Social History:  Patient  reports that she has never smoked. She has never used smokeless tobacco. She reports current alcohol use of about 2.0 - 3.0 standard drinks of alcohol per week. She reports that she does not use drugs.  Review of Systems: Constitutional: Negative for fever malaise or anorexia Cardiovascular: negative for chest pain Respiratory: negative for SOB or persistent cough Gastrointestinal: negative for abdominal pain  Objective  Vitals: BP 100/68   Pulse (!) 59   Temp 98.7 F (37.1 C) (Temporal)   Ht 5\' 6"  (1.676 m)   Wt 138 lb (62.6 kg)   LMP 10/11/2012   SpO2 100%   BMI 22.27 kg/m  General: no acute distress , A&Ox3 HEENT: PEERL, conjunctiva normal, TM: small effusions, nl landmarks w/o erythema. No ttp, OP clear, nontender TMJs. No LAD Cardiovascular:  RRR without murmur or gallop.  Respiratory:  Good breath sounds bilaterally, CTAB with normal respiratory effort     Commons side effects, risks, benefits, and alternatives for medications and treatment plan prescribed today were discussed, and the patient expressed understanding of the  given instructions. Patient is instructed to call or message via MyChart if he/she has any questions or concerns regarding our treatment plan. No barriers to understanding were identified. We discussed Red Flag symptoms and signs in detail. Patient expressed understanding regarding what to do in case of urgent or emergency type symptoms.  Medication list was reconciled, printed and provided to the patient in AVS. Patient instructions and summary information was reviewed with the patient as documented in the AVS. This note was prepared with assistance of Dragon voice recognition software. Occasional wrong-word or sound-a-like substitutions may have occurred due to the inherent limitations of voice recognition software  This visit occurred during the SARS-CoV-2 public health emergency.  Safety protocols were in place, including screening questions prior to the visit, additional usage of staff PPE, and extensive cleaning of exam room while observing appropriate contact time as indicated for disinfecting solutions.

## 2020-12-28 ENCOUNTER — Encounter: Payer: Self-pay | Admitting: Physician Assistant

## 2021-01-19 NOTE — Progress Notes (Deleted)
Office Visit Note  Patient: Alexandra Garner             Date of Birth: 11-10-1960           MRN: [id number]             PCP: Leamon Arnt, MD Referring: Leamon Arnt, MD Visit Date: 02/02/2021 Occupation: '@GUAROCC'$ @  Subjective:  No chief complaint on file.   History of Present Illness: Alexandra Garner is a 60 y.o. female ***   Activities of Daily Living:  Patient reports morning stiffness for *** {minute/hour:19697}.   Patient {ACTIONS;DENIES/REPORTS:21021675::"Denies"} nocturnal pain.  Difficulty dressing/grooming: {ACTIONS;DENIES/REPORTS:21021675::"Denies"} Difficulty climbing stairs: {ACTIONS;DENIES/REPORTS:21021675::"Denies"} Difficulty getting out of chair: {ACTIONS;DENIES/REPORTS:21021675::"Denies"} Difficulty using hands for taps, buttons, cutlery, and/or writing: {ACTIONS;DENIES/REPORTS:21021675::"Denies"}  No Rheumatology ROS completed.   PMFS History:  Patient Active Problem List   Diagnosis Date Noted  . 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 Palpitations 08/24/2018  . Dyshidrotic eczema 11/14/2017  . Antiphospholipid syndrome ([place]) 11/14/2017  . 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 Hormone replacement therapy (HRT) 11/14/2017  . History of migraine 11/07/2017  . Autoimmune disease ([place]) 10/20/2016  . High risk medication use 09/26/2016  . Raynaud's disease without gangrene 09/19/2016  . Primary osteoarthritis of both feet 09/19/2016  . Family history of macular degeneration 02/03/2015  . Primary localized osteoarthrosis of hand 12/23/2013  . History of colonic polyps 11/01/2008    Past Medical History:  Diagnosis Date  . 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  . 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 Fibroid   . Hand pain    left hand-had 2 cortisone injections  . Hx of migraines   . 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 Post-operative nausea and vomiting   . RA (rheumatoid arthritis) ([facility])     Family History  Problem  Relation Age of Onset  . Clotting disorder Mother   . Prostate cancer Father   . Colon polyps Father   . Cervical cancer Maternal Grandmother   . Healthy Daughter    Past Surgical History:  Procedure Laterality Date  . ABDOMINAL HYSTERECTOMY    . AUGMENTATION MAMMAPLASTY    . BREAST SURGERY    . BUTTOCK MASS EXCISION Left 08/26/2019   Dr. Janice Coffin  . COLONOSCOPY  03/28/2008  . PELVIC LAPAROSCOPY    . REDUCTION MAMMAPLASTY     Social History   Social History Narrative  . Not on file   Immunization History  Administered Date(s) Administered  . PFIZER(Purple Top)SARS-COV-2 Vaccination 07/20/2019, 08/11/2019, 03/30/2020  . Tdap 10/01/2011  . Zoster Recombinat (Shingrix) 04/15/2019, 06/17/2019     Objective: Vital Signs: LMP 10/11/2012    Physical Exam   Musculoskeletal Exam: ***  CDAI Exam: CDAI Score: -- Patient Global: --; Provider Global: -- Swollen: --; Tender: -- Joint Exam 02/02/2021   No joint exam has been documented for this visit   There is currently no information documented on the homunculus. Go to the Rheumatology activity and complete the homunculus joint exam.  Investigation: No additional findings.  Imaging: No results found.  Recent Labs: Lab Results  Component Value Date   WBC 5.1 08/29/2020   HGB 12.7 08/29/2020   PLT 254 08/29/2020   NA 140 08/29/2020   K 4.3 08/29/2020   CL 105 08/29/2020   CO2 26 08/29/2020   GLUCOSE 71 08/29/2020   BUN 11 08/29/2020   CREATININE 0.77 08/29/2020   BILITOT 0.5 08/29/2020   ALKPHOS  33 (L) 01/10/2020   AST 17 08/29/2020   ALT 16 08/29/2020   PROT 6.5 08/29/2020   ALBUMIN 3.9 01/10/2020   CALCIUM 9.6 08/29/2020   GFRAA 99 08/29/2020    Speciality Comments: Plaquenil eye exam: normal on 12/28/2020 WNL [place] Follow up in 1 year.   Procedures:  No procedures performed Allergies: Penicillins, Adhesive [tape], and Latex   Assessment / Plan:     Visit Diagnoses:  Autoimmune disease ([place])  High risk medication use  Raynaud's disease without gangrene  Antiphospholipid antibody positive  Positive cardiolipin antibodies, positive b2 GP1  Primary osteoarthritis of both hands  Primary osteoarthritis of both feet  Trochanteric bursitis of both hips  DDD (degenerative disc disease), cervical  DDD (degenerative disc disease), lumbar  Osteopenia of multiple sites  Vitamin D deficiency  History of migraine  Myxoma  History of colonic polyps  Other fatigue  Tinnitus, bilateral  Post-menopausal  Orders: No orders of the defined types were placed in this encounter.  No orders of the defined types were placed in this encounter.   Face-to-face time spent with patient was *** minutes. Greater than 50% of time was spent in counseling and coordination of care.  Follow-Up Instructions: No follow-ups on file.   Ofilia Neas, PA-C  Note - This record has been created using Dragon software.  Chart creation errors have been sought, but may not always  have been located. Such creation errors do not reflect on  the standard of medical care.

## 2021-01-27 ENCOUNTER — Other Ambulatory Visit: Payer: Self-pay | Admitting: Obstetrics & Gynecology

## 2021-01-29 ENCOUNTER — Other Ambulatory Visit: Payer: Self-pay

## 2021-02-02 ENCOUNTER — Ambulatory Visit: Payer: 59 | Admitting: Rheumatology

## 2021-02-02 DIAGNOSIS — R5383 Other fatigue: Secondary | ICD-10-CM

## 2021-02-02 DIAGNOSIS — M19071 Primary osteoarthritis, right ankle and foot: Secondary | ICD-10-CM

## 2021-02-02 DIAGNOSIS — Z86010 Personal history of colonic polyps: Secondary | ICD-10-CM

## 2021-02-02 DIAGNOSIS — D219 Benign neoplasm of connective and other soft tissue, unspecified: Secondary | ICD-10-CM

## 2021-02-02 DIAGNOSIS — M7062 Trochanteric bursitis, left hip: Secondary | ICD-10-CM

## 2021-02-02 DIAGNOSIS — M5030 Other cervical disc degeneration, unspecified cervical region: Secondary | ICD-10-CM

## 2021-02-02 DIAGNOSIS — I7300 Raynaud's syndrome without gangrene: Secondary | ICD-10-CM

## 2021-02-02 DIAGNOSIS — E559 Vitamin D deficiency, unspecified: Secondary | ICD-10-CM

## 2021-02-02 DIAGNOSIS — M5136 Other intervertebral disc degeneration, lumbar region: Secondary | ICD-10-CM

## 2021-02-02 DIAGNOSIS — Z79899 Other long term (current) drug therapy: Secondary | ICD-10-CM

## 2021-02-02 DIAGNOSIS — M8589 Other specified disorders of bone density and structure, multiple sites: Secondary | ICD-10-CM

## 2021-02-02 DIAGNOSIS — M359 Systemic involvement of connective tissue, unspecified: Secondary | ICD-10-CM

## 2021-02-02 DIAGNOSIS — R760 Raised antibody titer: Secondary | ICD-10-CM

## 2021-02-02 DIAGNOSIS — H9313 Tinnitus, bilateral: Secondary | ICD-10-CM

## 2021-02-02 DIAGNOSIS — Z780 Asymptomatic menopausal state: Secondary | ICD-10-CM

## 2021-02-02 DIAGNOSIS — M19041 Primary osteoarthritis, right hand: Secondary | ICD-10-CM

## 2021-02-02 DIAGNOSIS — Z8669 Personal history of other diseases of the nervous system and sense organs: Secondary | ICD-10-CM

## 2021-02-06 NOTE — Progress Notes (Signed)
Office Visit Note  Patient: Alexandra Garner             Date of Birth: 1960/09/12           MRN: [id number]             PCP: Leamon Arnt, MD Referring: Leamon Arnt, MD Visit Date: 02/20/2021 Occupation: @GUAROCC @  Subjective:  Right hip and knee pain   History of Present Illness: Alexandra Garner is a 60 y.o. female with history of autoimmune disease, osteoarthritis, and DDD.  She takes plaquenil 200 mg 1 tablet by mouth daily.  She has not missed any doses of Plaquenil recently.  She remains on aspirin 162 mg daily.  She denies any history of blood clots.  Patient reports that over the past 1 week she has been experiencing significant discomfort in her right knee and right hip.  She states that she typically walks on a daily basis for exercise but was unable to complete her walk 1 week ago due to severity of pain.  She tried using an elliptical the next day and had to stop her workout early due to the severity of pain.  She states that she tried getting a massage a couple days later and did not notice any improvement in her right hip pain.  She has noticed some swelling in her right knee as well.  She has an upcoming trip to [place] so she is concerned about sitting on the plane for prolonged periods of time as well as walking prolonged distances while traveling. She denies any other signs or symptoms of a flare recently.  She has not had any symptoms of Raynaud's.  She denies any recent rashes.  She continues to have 1 nose sore but it has been tolerable recently.  She denies any increased hair loss or photosensitivity.  She denies any swollen lymph nodes or fevers.  She has not had any shortness of breath, pleuritic chest pain, or palpitations.   Activities of Daily Living:  Patient reports morning stiffness for 20 minutes.   Patient Reports nocturnal pain.  Difficulty dressing/grooming: Denies Difficulty climbing stairs: Denies Difficulty getting out of chair: Denies Difficulty using  hands for taps, buttons, cutlery, and/or writing: Reports  Review of Systems  Constitutional:  Negative for fatigue.  HENT:  Positive for nose dryness. Negative for mouth sores and mouth dryness.        Nose sore  Eyes:  Negative for pain, itching and dryness.  Respiratory:  Negative for shortness of breath and difficulty breathing.   Cardiovascular:  Negative for chest pain and palpitations.  Gastrointestinal:  Negative for blood in stool, constipation and diarrhea.  Endocrine: Negative for increased urination.  Genitourinary:  Negative for difficulty urinating.  Musculoskeletal:  Positive for joint pain, joint pain, joint swelling, myalgias, morning stiffness, muscle tenderness and myalgias.  Skin:  Negative for color change, rash and redness.  Allergic/Immunologic: Negative for susceptible to infections.  Neurological:  Negative for dizziness, numbness, headaches, memory loss and weakness.  Hematological:  Positive for bruising/bleeding tendency.  Psychiatric/Behavioral:  Negative for confusion.    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   Hormone replacement therapy (HRT) 11/14/2017   History of migraine 11/07/2017   Autoimmune disease ([place]) 10/20/2016   High risk medication use 09/26/2016  Raynaud's disease without gangrene 09/19/2016   Primary osteoarthritis of both feet 09/19/2016   Family history of macular degeneration 02/03/2015   Primary localized osteoarthrosis of hand 12/23/2013   History of colonic polyps 11/01/2008    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 Janice Coffin   COLONOSCOPY  03/28/2008   PELVIC LAPAROSCOPY     REDUCTION MAMMAPLASTY     Social History   Social History Narrative   Not on file   Immunization History  Administered Date(s) Administered   PFIZER(Purple Top)SARS-COV-2 Vaccination 07/20/2019, 08/11/2019, 03/30/2020, 10/30/2020   Tdap 10/01/2011   Zoster Recombinat (Shingrix) 04/15/2019, 06/17/2019     Objective: Vital Signs: BP 131/81 (BP Location: Left Arm, Patient Position: Sitting, Cuff Size: Normal)   Pulse (!) 59   Ht 5' 5.5" (1.664 m)   Wt 139 lb 9.6 oz (63.3 kg)   LMP 10/11/2012   BMI 22.88 kg/m    Physical Exam Vitals and nursing note reviewed.  Constitutional:      Appearance: She is well-developed.  HENT:     Head: Normocephalic and atraumatic.  Eyes:     Conjunctiva/sclera: Conjunctivae normal.  Pulmonary:     Effort: Pulmonary effort is normal.  Abdominal:     Palpations: Abdomen is soft.  Musculoskeletal:     Cervical back: Normal range of motion.  Skin:    General: Skin is warm and dry.     Capillary Refill: Capillary refill takes less than 2 seconds.  Neurological:     Mental Status: She is alert and oriented to person, place, and time.  Psychiatric:        Behavior: Behavior normal.     Musculoskeletal Exam: C-spine, thoracic spine, lumbar spine have good range of motion with no discomfort.  Shoulder joints, elbow joints, wrist joints, MCPs, PIPs, DIPs have good range of motion with no synovitis.  PIP and DIP thickening  consistent with osteoarthritis of both hands noted.  Complete fist formation bilaterally.  Hip joints have good range of motion with no groin pain.  She has tenderness over the right trochanteric bursa and IT band.  Some discomfort with range of motion of the right knee.  Tenderness and warmth over the right pes anserine bursa.  Ankle joints have good range of motion with no tenderness or joint swelling.  No tenderness over MTP joints.  CDAI Exam: CDAI Score: -- Patient Global: --; Provider Global: -- Swollen: --; Tender: -- Joint Exam 02/20/2021   No joint exam has been documented for this visit   There is currently no information documented on the homunculus. Go to the Rheumatology activity and complete the homunculus joint exam.  Investigation: No additional findings.  Imaging: XR KNEE 3 VIEW RIGHT  Result Date: 02/20/2021 Mild medial compartment narrowing was noted.  No chondrocalcinosis was noted.  Mild patellofemoral narrowing was noted. Impression: These findings are consistent mild osteoarthritis and mild chondromalacia patella.   Recent Labs: Lab Results  Component Value Date   WBC 5.1 08/29/2020   HGB 12.7 08/29/2020   PLT 254 08/29/2020   NA 140 08/29/2020   K 4.3 08/29/2020   CL 105 08/29/2020   CO2 26 08/29/2020   GLUCOSE 71 08/29/2020   BUN 11 08/29/2020   CREATININE 0.77 08/29/2020   BILITOT 0.5 08/29/2020   ALKPHOS 33 (L) 01/10/2020   AST 17 08/29/2020   ALT 16 08/29/2020   PROT 6.5 08/29/2020   ALBUMIN 3.9 01/10/2020   CALCIUM 9.6 08/29/2020   GFRAA 99 08/29/2020    Speciality Comments: Plaquenil eye exam: normal on 12/28/2020 WNL [place] Follow up in 1 year.   Procedures:  Large Joint Inj: R greater trochanter on 02/20/2021 9:25 AM Indications: pain Details: 27 G 1.5 in lateral  Arthrogram: No  Medications: 1 mL lidocaine 1 %; 40 mg triamcinolone acetonide 40 MG/ML Aspirate: 0 mL Outcome: tolerated well, no immediate  complications   Large Joint Inj: R knee on 02/20/2021 12:12 PM Indications: pain Details: 27 G 1.5 in medial  Arthrogram: No  Medications: 1.5 mL lidocaine 1 %; 40 mg triamcinolone acetonide 40 MG/ML Aspirate: 0 mL Outcome: tolerated well, no immediate complications   Allergies: Penicillins, Adhesive [tape], and Latex        Assessment / Plan:     Visit Diagnoses: Autoimmune disease ([place]) - History of arthralgias, [name], photosensitivity, positive aCL IgM46, positive beta 2 IgM 100: Overall she is clinically been doing well taking Plaquenil 200 mg 1 tablet by mouth daily.  Over the past 1 week she has started to experience increased pain in the right knee joint as well as over the right trochanteric bursa.  Right knee was x-rayed today and injected with cortisone as requested by the patient.  She had no other joint tenderness or synovitis on exam.  She has not had any recent rashes, signs of alopecia, increased photosensitivity, symptoms of Raynaud's, oral ulcerations, increased fatigue, shortness of breath, pleuritic chest pain, or swollen lymph nodes.   Lab work from 08/29/2020 was reviewed with the patient today in the office: CBC WNL, CMP WNL, protein creatinine ratio WNL,, dsDNA negative, complements WNL, ESR WNL, ANA remains positive-1:40NH, lupus anticoagulant was detected, anticardiolipin IgM 23.7, and Beta-2 glycoprotein I AgG 36.1.  She remains on aspirin 162 mg daily.  She has no history of blood clots.  We will be repeating the following lab work today.  A referral to hematology will be placed today for further recommendations.  Discussed with Dr. Estanislado Pandy and advised the patient to increase aspirin to 325 mg 2 to 3 days prior to her upcoming trip to [place] next week and throughout the vacation.  The patient is in agreement.  She will remain on Plaquenil as prescribed.  We will check a hydroxychloroquine drug level today.  She was advised to notify us if she develops any new or  worsening symptoms.  She will follow-up in the office in 5 months.- Plan: Protein / creatinine ratio, urine, CBC with Differential/Platelet, COMPLETE METABOLIC PANEL WITH GFR, Anti-DNA antibody, double-stranded, C3 and C4, Sedimentation rate, VITAMIN D 25 Hydroxy (Vit-D Deficiency, Fractures), ANA, Beta-2 glycoprotein antibodies, Cardiolipin antibodies, IgG, IgM, IgA, Lupus Anticoagulant  Eval w/Reflex, Hydroxychloroquine, Blood  High risk medication use - Plaquenil 200 mg 1 tablet by mouth daily.  Hydroxychloroquine blood drug level was obtained today.  Plaquenil eye exam: normal on 12/28/2020 WNL [place] Follow up in 1 year. CBC and CMP were within normal limits on 08/29/2020.  CBC and CMP will be drawn today to monitor for drug toxicity. - Plan: CBC with Differential/Platelet, COMPLETE METABOLIC PANEL WITH GFR, Hydroxychloroquine, Blood  Raynaud's disease without gangrene: Not currently active.  She has not had any recent symptoms of Raynaud's.  No digital ulcerations, gangrene, or signs of sclerodactyly were noted on examination today.  She had good capillary refill.  She was advised to notify us if she develops any new or worsening symptoms.  Antiphospholipid antibody positive -History of beta-2 glycoprotein antibodies positive, anticardiolipin antibody positive, and lupus anticoagulant positive. No history of blood clots. Lab work from 08/29/20 was reviewed today in the office: Lupus anticoagulant was detected, anticardiolipin IgM 23.7, and Beta-2 glycoprotein I AgG 36.1.  Dr. Estanislado Pandy had recommended repeating lupus anticoagulant in 3 months with the patient did not return to update lab work at that time.  We will obtain the following lab work today.   A referral to hematology will be placed for further recommendations.  With her upcoming travel to [place] she was advised to increase the dose of aspirin to 325 mg 2 to 3 days prior to departure, throughout the trip, and 2 to 3 days after  returning.  We also discussed the importance of getting up every hour while on the flight and keeping her water intake up while on vacation. She is in agreement.  Plan: Beta-2 glycoprotein antibodies, Cardiolipin antibodies, IgG, IgM, IgA, Lupus Anticoagulant Eval w/Reflex    Primary osteoarthritis of both hands: She has PIP and DIP prominence consistent with osteoarthritis of both hands.  No inflammation was noted on examination today.  She was able to make a complete fist bilaterally.  Discussed the importance of joint protection and muscle strengthening.  Primary osteoarthritis of both feet: She has PIP and DIP thickening consistent with osteoarthritis of both feet.  No tenderness over MTP joints noted.  Ankle joints have good range of motion with no tenderness or inflammation.  Discussed the importance of wearing proper fitting shoes.  Chronic pain of right knee - She presents today with increased pain in the right knee joint for the past 1 week.  She did not have any injury prior to the onset of symptoms.  According to the patient she walks on a daily basis for exercise but about 1 week ago she had difficulty completing her walk due to the severity of pain in her right knee and right hip.  She has been unable to use her elliptical due to the severity of pain.  She has an upcoming a trip to [place] next week so she is concerned by the amount of walking she will be doing.  She has not been experiencing any mechanical symptoms.  On examination she has good range of motion of the right knee joint.  She has some tenderness over the Pez anserine bursa of the right knee.  No effusion in the right knee joint was noted.  X-rays of the right knee were obtained today.  Findings were consistent with mild osteoarthritis and mild chondromalacia patella.  The right knee joint was injected with cortisone as requested by the patient.  Procedure note was completed above.  Aftercare was discussed.  She was  encouraged to  rest, ice, and use compression.  She was strongly encouraged to bring a compression sleeve with her to [place] for support.  She was advised to notify us if she develops increased joint pain or joint swelling.Plan: XR KNEE 3 VIEW RIGHT, Large Joint Inj: R knee  Primary osteoarthritis of both knees: X-rays of both knees were initially obtained on 08/10/18.  She is not having any discomfort in her left knee joint at this time.  She is good range of motion of both knee joints with no warmth or effusion on examination today.  She is having increased discomfort in the right knee joint as discussed above.  X-rays of the right knee were updated today and the right knee joint was injected with cortisone after informed consent.  She was advised to notify us if her right knee joint pain persists or worsens.  Trochanteric bursitis, right hip - She presents today with discomfort on the lateral aspect of her right hip which started 1 week ago.  She walks on a daily basis for exercise but was unable to finish her walk about 1 week ago due to severity of pain.  She has tenderness palpation over the right trochanteric bursa on examination today.  The right hip joint has good range of motion with no groin pain.  She was able to bear full weight on her right lower extremity.  The right trochanteric bursa was injected with cortisone today as requested by the patient.  Procedure note was completed above.  Aftercare was discussed.  She was advised to perform stretching exercises on a daily basis once her symptoms have completely resolved.  She was advised to notify us if her discomfort persists or worsens.  Plan: Large Joint Inj: R greater trochanter  DDD (degenerative disc disease), cervical - MRI cervical spine multilevel DDD and spondylosis with moderate neuroforaminal narrowing.  She has good range of motion on examination today.  No symptoms of radiculopathy at this time.  DDD (degenerative disc disease), lumbar - MRI lumbar  spine on 05/12/19-L5-S1 disc bulge and mild to moderate neural foraminal narrowing.  She is not experiencing any increased lower back pain currently.  Osteopenia of multiple sites - DEXA 07/25/2020 The BMD measured at [facility] L1-L2 is 0.962 g/cm2 with a T-score of -1.7.  She continues to take vitamin D 2000 units daily.  Vitamin D deficiency - She takes vitamin D 2000 units daily.  Vitamin D level will be checked today. Plan: VITAMIN D 25 Hydroxy (Vit-D Deficiency, Fractures)  Other fatigue: Stable. Discussed the importance of regular exercise.   Other medical conditions are listed as follows:   History of migraine  Pitting edema: Mild, intermittent.   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 Follow-up scheduled with her orthopedist on 09/19/2019 to discuss repeat MRI of the left hip as recommended on a yearly.   History of colonic polyps  Tinnitus, bilateral  Orders: Orders Placed This Encounter  Procedures   Large Joint Inj: R greater trochanter   Large Joint Inj: R knee   XR KNEE 3 VIEW RIGHT   Protein / creatinine ratio, urine   CBC with Differential/Platelet   COMPLETE METABOLIC PANEL WITH GFR   Anti-DNA antibody, double-stranded   C3 and C4   Sedimentation rate   VITAMIN D 25 Hydroxy (Vit-D Deficiency, Fractures)   ANA   Beta-2 glycoprotein antibodies   Cardiolipin antibodies, IgG, IgM, IgA   Lupus Anticoagulant Eval w/Reflex   Hydroxychloroquine, Blood  No orders of the defined types were placed in this encounter.    Follow-Up Instructions: Return in about 5 months (around 07/23/2021) for Autoimmune Disease.   Ofilia Neas, PA-C  Note - This record has been created using Dragon software.  Chart creation errors have been sought, but may not always  have been located. Such creation errors do not reflect on  the standard of medical care.

## 2021-02-19 NOTE — Telephone Encounter (Signed)
Patient scheduled for 02/20/2021 at 8:40 am.

## 2021-02-20 ENCOUNTER — Other Ambulatory Visit: Payer: Self-pay

## 2021-02-20 ENCOUNTER — Ambulatory Visit: Payer: Self-pay

## 2021-02-20 ENCOUNTER — Ambulatory Visit: Payer: 59 | Admitting: Physician Assistant

## 2021-02-20 ENCOUNTER — Encounter: Payer: Self-pay | Admitting: Physician Assistant

## 2021-02-20 VITALS — BP 131/81 | HR 59 | Ht 65.5 in | Wt 139.6 lb

## 2021-02-20 DIAGNOSIS — M19071 Primary osteoarthritis, right ankle and foot: Secondary | ICD-10-CM

## 2021-02-20 DIAGNOSIS — I7300 Raynaud's syndrome without gangrene: Secondary | ICD-10-CM

## 2021-02-20 DIAGNOSIS — H9313 Tinnitus, bilateral: Secondary | ICD-10-CM

## 2021-02-20 DIAGNOSIS — E559 Vitamin D deficiency, unspecified: Secondary | ICD-10-CM

## 2021-02-20 DIAGNOSIS — M5030 Other cervical disc degeneration, unspecified cervical region: Secondary | ICD-10-CM

## 2021-02-20 DIAGNOSIS — M19072 Primary osteoarthritis, left ankle and foot: Secondary | ICD-10-CM

## 2021-02-20 DIAGNOSIS — Z79899 Other long term (current) drug therapy: Secondary | ICD-10-CM

## 2021-02-20 DIAGNOSIS — M19042 Primary osteoarthritis, left hand: Secondary | ICD-10-CM

## 2021-02-20 DIAGNOSIS — M19041 Primary osteoarthritis, right hand: Secondary | ICD-10-CM

## 2021-02-20 DIAGNOSIS — M359 Systemic involvement of connective tissue, unspecified: Secondary | ICD-10-CM | POA: Diagnosis not present

## 2021-02-20 DIAGNOSIS — G8929 Other chronic pain: Secondary | ICD-10-CM

## 2021-02-20 DIAGNOSIS — M7061 Trochanteric bursitis, right hip: Secondary | ICD-10-CM

## 2021-02-20 DIAGNOSIS — M5136 Other intervertebral disc degeneration, lumbar region: Secondary | ICD-10-CM

## 2021-02-20 DIAGNOSIS — R760 Raised antibody titer: Secondary | ICD-10-CM

## 2021-02-20 DIAGNOSIS — M25561 Pain in right knee: Secondary | ICD-10-CM

## 2021-02-20 DIAGNOSIS — M7062 Trochanteric bursitis, left hip: Secondary | ICD-10-CM

## 2021-02-20 DIAGNOSIS — D219 Benign neoplasm of connective and other soft tissue, unspecified: Secondary | ICD-10-CM

## 2021-02-20 DIAGNOSIS — Z86010 Personal history of colonic polyps: Secondary | ICD-10-CM

## 2021-02-20 DIAGNOSIS — M8589 Other specified disorders of bone density and structure, multiple sites: Secondary | ICD-10-CM

## 2021-02-20 DIAGNOSIS — R609 Edema, unspecified: Secondary | ICD-10-CM

## 2021-02-20 DIAGNOSIS — M170 Bilateral primary osteoarthritis of knee: Secondary | ICD-10-CM

## 2021-02-20 DIAGNOSIS — Z8669 Personal history of other diseases of the nervous system and sense organs: Secondary | ICD-10-CM

## 2021-02-20 DIAGNOSIS — R5383 Other fatigue: Secondary | ICD-10-CM

## 2021-02-20 MED ORDER — TRIAMCINOLONE ACETONIDE 40 MG/ML IJ SUSP
40.0000 mg | INTRAMUSCULAR | Status: AC | PRN
  Administered 2021-02-20: 40 mg via INTRA_ARTICULAR

## 2021-02-20 MED ORDER — LIDOCAINE HCL 1 % IJ SOLN
1.5000 mL | INTRAMUSCULAR | Status: AC | PRN
  Administered 2021-02-20: 1.5 mL

## 2021-02-20 MED ORDER — LIDOCAINE HCL 1 % IJ SOLN
1.0000 mL | INTRAMUSCULAR | Status: AC | PRN
  Administered 2021-02-20: 1 mL

## 2021-02-20 NOTE — Patient Instructions (Signed)
Pes Anserine Bursitis  The pes anserine is an area on the inside of your knee, just below the joint, that is cushioned by a fluid-filled sac (bursa). Pes anserine bursitis is a condition that happens when the bursa getsswollen and irritated. The condition causes knee pain. What are the causes? This condition may be caused by: Making the same movement over and over. A direct hit (trauma) to the inside of the leg. What increases the risk? You are more likely to develop this condition if you: Are a runner. Play sports that involve a lot of running and quick side-to-side movements (cutting). Are an athlete who plays contact sports. Swim using an inward angle of the knee, such as with the breaststroke. Have tight hamstring muscles. Are a woman. Are overweight. Have flat feet. Have diabetes or osteoarthritis. What are the signs or symptoms? Symptoms of this condition include: Knee pain that gets better with rest and worse with activities like climbing stairs, walking, running, or getting in and out of a chair. Swelling. Warmth. Tenderness when pressing at the inside of the lower leg, just below the knee. How is this diagnosed? This condition may be diagnosed based on: Your symptoms. Your medical history. A physical exam. During your physical exam, your health care provider will press on the tendon attachment to see if you feel pain. Your health care provider may also check your hip and knee motion and strength. Tests to check for swelling and fluid buildup in the bursa and to look at muscles, bones, and tendons. These tests might include: X-rays. MRI. Ultrasound. How is this treated? This condition may be treated by: Resting your knee. You may be told to raise (elevate) your knee while resting. Avoiding activities that cause pain. Icing the inside of your knee. Sleeping with a pillow between your knees. This will cushion your injured knee. Taking medicine by mouth (orally) to  reduce pain and swelling or having medicine injected into your knee. Doing strengthening and stretching exercises (physical therapy). If these treatments do not work or if the condition keeps coming back, you mayneed to have surgery to remove the bursa. Follow these instructions at home: Managing pain, stiffness, and swelling  If directed, put ice on the injured area. Put ice in a plastic bag. Place a towel between your skin and the bag. Leave the ice on for 20 minutes, 2-3 times a day. Elevate the injured area above the level of your heart while you are sitting or lying down.  Activity Return to your normal activities as told by your health care provider. Ask your health care provider what activities are safe for you. Do exercises as told by your health care provider. General instructions Take over-the-counter and prescription medicines only as told by your health care provider. Sleep with a pillow between your knees. Do not use any products that contain nicotine or tobacco, such as cigarettes, e-cigarettes, and chewing tobacco. These can delay healing. If you need help quitting, ask your health care provider. If you are overweight, work with your health care provider and a dietitian to set a weight-loss goal that is healthy and reasonable for you. Keep all follow-up visits as told by your health care provider. This is important. How is this prevented? When exercising, make sure that you: Warm up and stretch before being active. Cool down and stretch after being active. Give your body time to rest between periods of activity. Use equipment that fits you. Are safe and responsible while being active to  avoid falls. Do at least 150 minutes of moderate-intensity exercise each week, such as brisk walking or water aerobics. Maintain physical fitness, including: Strength. Flexibility. Cardiovascular fitness. Endurance. Maintain a healthy weight. Contact a health care provider if: Your  symptoms do not improve. Your symptoms get worse. Summary Pes anserine bursitis is a condition that happens when the fluid-filled sac (bursa) at the inside of your knee gets swollen and irritated. The condition causes knee pain. Treatment for pes anserine bursitis may include resting your knee, icing the inside of your knee, sleeping with a pillow between your knees, taking medicine by mouth or by injection, and doing strengthening and stretching exercises (physical therapy). Follow instructions for managing pain, stiffness, and swelling. Take over-the-counter and prescription medicines only as told by your health care provider. This information is not intended to replace advice given to you by your health care provider. Make sure you discuss any questions you have with your healthcare provider. Document Revised: 10/01/2018 Document Reviewed: 11/19/2017 Elsevier Patient Education  [place].  Pes Anserine Bursitis Rehab Ask your health care provider which exercises are safe for you. Do exercises exactly as told by your health care provider and adjust them as directed. It is normal to feel mild stretching, pulling, tightness, or discomfort as you do these exercises. Stop right away if you feel sudden pain or your pain gets worse. Do not begin these exercises until told by your health care provider. Stretching and range-of-motion exercises These exercises warm up your muscles and joints and improve the movement and flexibility of your knee. These exercises also help to relieve pain andstiffness. Hamstring stretch, doorway  Lie on your back in front of a doorway with your left / right leg resting against the wall and your other leg flat on the floor in the doorway. There should be a slight bend in your left / right knee. Straighten your left / right knee. You should feel a stretch behind your knee or thigh (hamstring). If you do not, scoot your buttocks closer to the door. Hold this position  for __________ seconds. Repeat __________ times. Complete this exercise __________ times a day. Seated stretch This exercise is sometimes called hamstrings and adductors stretch. Sit on the floor with your legs stretched wide. Keep your knees straight during this exercise. Keeping your head and back in a straight line, bend at your waist to reach for your left foot (position A). You should feel a stretch in your right inner thigh (adductors). Hold for __________ seconds. Then slowly return to the upright position. Keeping your head and back in a straight line, bend at your waist to reach forward (position B). You should feel a stretch behind both of your thighs or knees (hamstrings). Hold for __________ seconds. Then slowly return to the upright position. Keeping your head and back in a straight line, bend at your waist to reach for your right foot (position C). You should feel a stretch in your left inner thigh (adductors). Hold for __________ seconds. Then slowly return to the upright position. Repeat __________ times. Complete this exercise __________ times a day. Quadriceps, prone  Lie on your abdomen on a firm surface, such as a bed or padded floor (prone position). Bend your left / right knee and hold your ankle. If you cannot reach your ankle or pant leg, loop a belt around your foot and grab the belt instead. Gently pull your heel toward your buttocks. Your knee should not slide out to the side.  You should feel a stretch in the front of your thigh and knee (quadriceps). Hold this position for __________ seconds. Repeat __________ times. Complete this exercise __________ times a day. Strengthening exercises These exercises build strength and endurance in your knee and hip. Endurance isthe ability to use your muscles for a long time, even after they get tired. Quadriceps terminal knee extension Secure a long loop of rubber exercise band around a sturdy object like a table leg. Put the  band behind your left / right knee. Step back from where the band is secured to put tension on the band. Slowly bend your left / right knee. Keep your left / right foot flat on the floor. Tighten the muscle in the front of your thigh (quadriceps) and push back against the band to straighten your knee until it is completely straight (terminal extension). Hold this position for __________ seconds. Return to having your left / right knee bent. Repeat __________ times. Complete this exercise __________ times a day. Straight leg raises, side-lying This exercise strengthens the muscles that rotate the leg at the hip and move it away from your body (hip abductors). Lie on your side with your left / right leg in the top position. Lie so your head, shoulder, hip, and knee line up. You may bend your bottom knee to help you balance. Lift your top leg 4-6 inches (10-15 cm) while keeping your toes pointed straight ahead. Hold this position for __________ seconds. Slowly lower your leg to the starting position. Allow your muscles to relax completely after each repetition. Repeat __________ times. Complete this exercise __________ times a day. This information is not intended to replace advice given to you by your health care provider. Make sure you discuss any questions you have with your healthcare provider. Document Revised: 10/01/2018 Document Reviewed: 04/02/2018 Elsevier Patient Education  2022 Blunt Band Syndrome Rehab Ask your health care provider which exercises are safe for you. Do exercises exactly as told by your health care provider and adjust them as directed. It is normal to feel mild stretching, pulling, tightness, or discomfort as you do these exercises. Stop right away if you feel sudden pain or your pain gets significantly worse. Do not begin these exercises until told by your health care provider. Stretching and range-of-motion exercises These exercises warm up your  muscles and joints and improve the movement andflexibility of your hip and pelvis. Quadriceps stretch, prone  Lie on your abdomen (prone position) on a firm surface, such as a bed or padded floor. Bend your left / right knee and reach back to hold your ankle or pant leg. If you cannot reach your ankle or pant leg, loop a belt around your foot and grab the belt instead. Gently pull your heel toward your buttocks. Your knee should not slide out to the side. You should feel a stretch in the front of your thigh and knee (quadriceps). Hold this position for __________ seconds. Repeat __________ times. Complete this exercise __________ times a day. Iliotibial band stretch An iliotibial band is a strong band of muscle tissue that runs from the outer side of your hip to the outer side of your thigh and knee. Lie on your side with your left / right leg in the top position. Bend both of your knees and grab your left / right ankle. Stretch out your bottom arm to help you balance. Slowly bring your top knee back so your thigh goes behind your trunk. Slowly lower  your top leg toward the floor until you feel a gentle stretch on the outside of your left / right hip and thigh. If you do not feel a stretch and your knee will not fall farther, place the heel of your other foot on top of your knee and pull your knee down toward the floor with your foot. Hold this position for __________ seconds. Repeat __________ times. Complete this exercise __________ times a day. Strengthening exercises These exercises build strength and endurance in your hip and pelvis. Enduranceis the ability to use your muscles for a long time, even after they get tired. Straight leg raises, side-lying This exercise strengthens the muscles that rotate the leg at the hip and move it away from your body (hip abductors). Lie on your side with your left / right leg in the top position. Lie so your head, shoulder, hip, and knee line up. You may  bend your bottom knee to help you balance. Roll your hips slightly forward so your hips are stacked directly over each other and your left / right knee is facing forward. Tense the muscles in your outer thigh and lift your top leg 4-6 inches (10-15 cm). Hold this position for __________ seconds. Slowly lower your leg to return to the starting position. Let your muscles relax completely before doing another repetition. Repeat __________ times. Complete this exercise __________ times a day. Leg raises, prone This exercise strengthens the muscles that move the hips backward (hip extensors). Lie on your abdomen (prone position) on your bed or a firm surface. You can put a pillow under your hips if that is more comfortable for your lower back. Bend your left / right knee so your foot is straight up in the air. Squeeze your buttocks muscles and lift your left / right thigh off the bed. Do not let your back arch. Tense your thigh muscle as hard as you can without increasing any knee pain. Hold this position for __________ seconds. Slowly lower your leg to return to the starting position and allow it to relax completely. Repeat __________ times. Complete this exercise __________ times a day. Hip hike Stand sideways on a bottom step. Stand on your left / right leg with your other foot unsupported next to the step. You can hold on to a railing or wall for balance if needed. Keep your knees straight and your torso square. Then lift your left / right hip up toward the ceiling. Slowly let your left / right hip lower toward the floor, past the starting position. Your foot should get closer to the floor. Do not lean or bend your knees. Repeat __________ times. Complete this exercise __________ times a day. This information is not intended to replace advice given to you by your health care provider. Make sure you discuss any questions you have with your healthcare provider. Document Revised: 08/18/2019 Document  Reviewed: 08/18/2019 Elsevier Patient Education  2022 [place]. Hip Bursitis Rehab Ask your health care provider which exercises are safe for you. Do exercises exactly as told by your health care provider and adjust them as directed. It is normal to feel mild stretching, pulling, tightness, or discomfort as you do these exercises. Stop right away if you feel sudden pain or your pain gets worse. Do not begin these exercises until told by your health care provider. Stretching exercise This exercise warms up your muscles and joints and improves the movement andflexibility of your hip. This exercise also helps to relieve pain and stiffness.  Iliotibial band stretch An iliotibial band is a strong band of muscle tissue that runs from the outer side of your hip to the outer side of your thigh and knee. Lie on your side with your left / right leg in the top position. Bend your left / right knee and grab your ankle. Stretch out your bottom arm to help you balance. Slowly bring your knee back so your thigh is behind your body. Slowly lower your knee toward the floor until you feel a gentle stretch on the outside of your left / right thigh. If you do not feel a stretch and your knee will not fall farther, place the heel of your other foot on top of your knee and pull your knee down toward the floor with your foot. Hold this position for __________ seconds. Slowly return to the starting position. Repeat __________ times. Complete this exercise __________ times a day. Strengthening exercises These exercises build strength and endurance in your hip and pelvis. Enduranceis the ability to use your muscles for a long time, even after they get tired. Bridge This exercise strengthens the muscles that move your thigh backward (hip extensors). Lie on your back on a firm surface with your knees bent and your feet flat on the floor. Tighten your buttocks muscles and lift your buttocks off the floor until your trunk  is level with your thighs. Do not arch your back. You should feel the muscles working in your buttocks and the back of your thighs. If you do not feel these muscles, slide your feet 1-2 inches (2.5-5 cm) farther away from your buttocks. If this exercise is too easy, try doing it with your arms crossed over your chest. Hold this position for __________ seconds. Slowly lower your hips to the starting position. Let your muscles relax completely after each repetition. Repeat __________ times. Complete this exercise __________ times a day. Squats This exercise strengthens the muscles in front of your thigh and knee (quadriceps). Stand in front of a table, with your feet and knees pointing straight ahead. You may rest your hands on the table for balance but not for support. Slowly bend your knees and lower your hips like you are going to sit in a chair. Keep your weight over your heels, not over your toes. Keep your lower legs upright so they are parallel with the table legs. Do not let your hips go lower than your knees. Do not bend lower than told by your health care provider. If your hip pain increases, do not bend as low. Hold the squat position for __________ seconds. Slowly push with your legs to return to standing. Do not use your hands to pull yourself to standing. Repeat __________ times. Complete this exercise __________ times a day. Hip hike Stand sideways on a bottom step. Stand on your left / right leg with your other foot unsupported next to the step. You can hold on to the railing or wall for balance if needed. Keep your knees straight and your torso square. Then lift your left / right hip up toward the ceiling. Hold this position for __________ seconds. Slowly let your left / right hip lower toward the floor, past the starting position. Your foot should get closer to the floor. Do not lean or bend your knees. Repeat __________ times. Complete this exercise __________ times a  day. Single leg stand Without shoes, stand near a railing or in a doorway. You may hold on to the railing or door frame  as needed for balance. Squeeze your left / right buttock muscles, then lift up your other foot. Do not let your left / right hip push out to the side. It is helpful to stand in front of a mirror for this exercise so you can watch your hip. Hold this position for __________ seconds. Repeat __________ times. Complete this exercise __________ times a day. This information is not intended to replace advice given to you by your health care provider. Make sure you discuss any questions you have with your healthcare provider. Document Revised: 10/05/2018 Document Reviewed: 10/05/2018 Elsevier Patient Education  [place].

## 2021-02-21 ENCOUNTER — Telehealth: Payer: Self-pay | Admitting: Hematology and Oncology

## 2021-02-21 NOTE — Telephone Encounter (Signed)
Scheduled appt per 8/31 referral. Pt had previously seen Dr. Marin Olp. I spoke to pt and she preferred to be seen at [facility]. I scheduled pt to see Dr. Alvy Bimler per referral.

## 2021-02-21 NOTE — Addendum Note (Signed)
Addended by: Earnestine Mealing on: 02/21/2021 09:15 AM   Modules accepted: Orders

## 2021-02-27 NOTE — Progress Notes (Signed)
Beta-2 glycoprotein IgM, anticardiolipin IgM, and lupus anticoagulant remain positive.  She has an upcoming appointment with Dr. Alvy Bimler scheduled this month.    Protein creatinine ratio WNL. ANA negative. CBC and CMP WNL. dsDNA negative.  Complements and ESR WNL.  Labs are not consistent with a flare at this time.  Vitamin D Wnl-Please advise the patient to continue on a maintenance dose of vitamin D.

## 2021-03-01 LAB — CARDIOLIPIN ANTIBODIES, IGG, IGM, IGA
Anticardiolipin IgA: <2 APL-U/mL
Anticardiolipin IgG: 3.1 GPL-U/mL
Anticardiolipin IgM: 24.7 MPL-U/mL — ABNORMAL HIGH

## 2021-03-01 LAB — CBC WITH DIFFERENTIAL/PLATELET
Absolute Monocytes: 296 cells/uL (ref 200–950)
Basophils Absolute: 41 cells/uL (ref 0–200)
Basophils Relative: 0.8 %
Eosinophils Absolute: 92 cells/uL (ref 15–500)
Eosinophils Relative: 1.8 %
HCT: 38.8 % (ref 35.0–45.0)
Hemoglobin: 13 g/dL (ref 11.7–15.5)
Lymphs Abs: 1413 cells/uL (ref 850–3900)
MCH: 29 pg (ref 27.0–33.0)
MCHC: 33.5 g/dL (ref 32.0–36.0)
MCV: 86.4 fL (ref 80.0–100.0)
MPV: 11.2 fL (ref 7.5–12.5)
Monocytes Relative: 5.8 %
Neutro Abs: 3259 cells/uL (ref 1500–7800)
Neutrophils Relative %: 63.9 %
Platelets: 271 10*3/uL (ref 140–400)
RBC: 4.49 10*6/uL (ref 3.80–5.10)
RDW: 12.5 % (ref 11.0–15.0)
Total Lymphocyte: 27.7 %
WBC: 5.1 10*3/uL (ref 3.8–10.8)

## 2021-03-01 LAB — C3 AND C4
C3 Complement: 107 mg/dL (ref 83–193)
C4 Complement: 17 mg/dL (ref 15–57)

## 2021-03-01 LAB — PROTEIN / CREATININE RATIO, URINE
Creatinine, Urine: 164 mg/dL (ref 20–275)
Protein/Creat Ratio: 73 mg/g creat (ref 24–184)
Protein/Creatinine Ratio: 0.073 mg/mg creat (ref 0.024–0.184)
Total Protein, Urine: 12 mg/dL (ref 5–24)

## 2021-03-01 LAB — COMPLETE METABOLIC PANEL WITH GFR
AG Ratio: 1.9 (calc) (ref 1.0–2.5)
ALT: 13 U/L (ref 6–29)
AST: 17 U/L (ref 10–35)
Albumin: 4.3 g/dL (ref 3.6–5.1)
Alkaline phosphatase (APISO): 43 U/L (ref 37–153)
BUN: 10 mg/dL (ref 7–25)
CO2: 28 mmol/L (ref 20–32)
Calcium: 9.5 mg/dL (ref 8.6–10.4)
Chloride: 105 mmol/L (ref 98–110)
Creat: 0.68 mg/dL (ref 0.50–1.03)
Globulin: 2.3 g/dL (calc) (ref 1.9–3.7)
Glucose, Bld: 85 mg/dL (ref 65–99)
Potassium: 4.5 mmol/L (ref 3.5–5.3)
Sodium: 139 mmol/L (ref 135–146)
Total Bilirubin: 0.5 mg/dL (ref 0.2–1.2)
Total Protein: 6.6 g/dL (ref 6.1–8.1)
eGFR: 101 mL/min/{1.73_m2} (ref >=60)

## 2021-03-01 LAB — BETA-2 GLYCOPROTEIN ANTIBODIES
Beta-2 Glyco 1 IgA: <2 U/mL
Beta-2 Glyco 1 IgM: 35.8 U/mL — ABNORMAL HIGH
Beta-2 Glyco I IgG: 3.7 U/mL

## 2021-03-01 LAB — RFLX HEXAGONAL PHASE CONFIRM: Hexagonal Phase Conf: POSITIVE — AB

## 2021-03-01 LAB — LUPUS ANTICOAGULANT EVAL W/ REFLEX
PTT-LA Screen: 43 s — ABNORMAL HIGH (ref <=40)
dRVVT: 50 s — ABNORMAL HIGH (ref <=45)

## 2021-03-01 LAB — ANTI-DNA ANTIBODY, DOUBLE-STRANDED: ds DNA Ab: <1 IU/mL

## 2021-03-01 LAB — RFX DRVVT 1:1 MIX

## 2021-03-01 LAB — ANA: Anti Nuclear Antibody (ANA): NEGATIVE

## 2021-03-01 LAB — RFLX DRVVT CONFRIM: DRVVT CONFIRM: POSITIVE — AB

## 2021-03-01 LAB — VITAMIN D 25 HYDROXY (VIT D DEFICIENCY, FRACTURES): Vit D, 25-Hydroxy: 42 ng/mL (ref 30–100)

## 2021-03-01 LAB — THROMBIN CLOTTING TIME: Thrombin Clotting Time: 17 s (ref 13–19)

## 2021-03-01 LAB — HYDROXYCHLOROQUINE,BLOOD: HYDROXYCHLOROQUINE, (B): 730 ng/mL — ABNORMAL HIGH

## 2021-03-01 LAB — SEDIMENTATION RATE: Sed Rate: 9 mm/h (ref 0–30)

## 2021-03-01 NOTE — Progress Notes (Signed)
Hydroxychloroquine blood level is within the desirable range.

## 2021-03-08 ENCOUNTER — Other Ambulatory Visit: Payer: Self-pay | Admitting: Physician Assistant

## 2021-03-08 DIAGNOSIS — M359 Systemic involvement of connective tissue, unspecified: Secondary | ICD-10-CM

## 2021-03-08 NOTE — Telephone Encounter (Signed)
Next Visit: 07/23/2021  Last Visit: 02/20/2021  Labs: 02/20/2021 CBC and CMP WNL Hydroxychloroquine blood level is within the desirable range.  Eye exam: normal on 12/28/2020 WNL    Current Dose per office note 02/20/2021: Plaquenil 200 mg 1 tablet by mouth daily  [id number] disease   Last Fill: 11/30/2020  Okay to refill Plaquenil?

## 2021-03-12 ENCOUNTER — Other Ambulatory Visit: Payer: Self-pay

## 2021-03-12 ENCOUNTER — Encounter: Payer: Self-pay | Admitting: Hematology and Oncology

## 2021-03-12 ENCOUNTER — Inpatient Hospital Stay: Payer: 59 | Attending: Hematology and Oncology | Admitting: Hematology and Oncology

## 2021-03-12 DIAGNOSIS — M359 Systemic involvement of connective tissue, unspecified: Secondary | ICD-10-CM | POA: Diagnosis not present

## 2021-03-12 DIAGNOSIS — Z79899 Other long term (current) drug therapy: Secondary | ICD-10-CM | POA: Diagnosis not present

## 2021-03-12 DIAGNOSIS — Z8042 Family history of malignant neoplasm of prostate: Secondary | ICD-10-CM | POA: Diagnosis not present

## 2021-03-12 DIAGNOSIS — Z7982 Long term (current) use of aspirin: Secondary | ICD-10-CM | POA: Diagnosis not present

## 2021-03-12 DIAGNOSIS — R760 Raised antibody titer: Secondary | ICD-10-CM | POA: Diagnosis not present

## 2021-03-12 DIAGNOSIS — M069 Rheumatoid arthritis, unspecified: Secondary | ICD-10-CM | POA: Insufficient documentation

## 2021-03-12 DIAGNOSIS — Z8541 Personal history of malignant neoplasm of cervix uteri: Secondary | ICD-10-CM | POA: Diagnosis not present

## 2021-03-12 DIAGNOSIS — Z79890 Hormone replacement therapy (postmenopausal): Secondary | ICD-10-CM | POA: Diagnosis not present

## 2021-03-12 NOTE — Assessment & Plan Note (Signed)
She follows closely with rheumatologist and is on hydroxychloroquine She will continue taking that

## 2021-03-12 NOTE — Assessment & Plan Note (Addendum)
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

## 2021-03-12 NOTE — Progress Notes (Signed)
[place] CONSULT NOTE  Patient Care Team: Alexandra Arnt, MD as PCP - General (Family Medicine) Alexandra Salon, MD as Consulting Physician (Gynecology) Alexandra Shipper, MD as Consulting Physician (Gastroenterology) Alexandra Merino, MD as Consulting Physician (Rheumatology)  Assessment and plan Antiphospholipid antibody positive Due to her autoimmune disorder, it is not unusual to see persistent, lingering antiphospholipid antibody She does not have any clinical signs or symptoms of blood clots She is taking preventive dose of aspirin She does not need long-term follow-up We discussed the implication of future perioperative blood test that could cause prolonged APTT She does not need to see me unless she need perioperative surgical clearance for that In fact, I recommend not to get her blood test repeated in the future in the absence of clinical diagnosis of thrombotic event We discussed risk factors for thrombosis and for now, she wants to continue on estrogen replacement therapy  Autoimmune disease ([place]) She follows closely with rheumatologist and is on hydroxychloroquine She will continue taking that  All questions were answered. The patient knows to call the clinic with any problems, questions or concerns. The total time spent in the appointment was 55 minutes encounter with patients including review of chart and various tests results, discussions about plan of care and coordination of care plan  Alexandra Lark, MD 9/19/20223:02 PM    CHIEF COMPLAINTS/PURPOSE OF CONSULTATION:  Antiphospholipid antibody positive blood test  HISTORY OF PRESENTING ILLNESS:  Alexandra Garner 60 y.o. female is here because of recent findings of abnormal blood test She had blood test performed many years ago and was referred to see Dr. Marin Garner  Most recently, on August 29, 2020 and August 30th 2022, her autoimmune panel disclosed presence of lupus anticoagulant and that prompted another  referral She was diagnosed with autoimmune disorder since 2017 and is seeing rheumatologist on a regular basis She is currently on Plaquenil for diffuse arthritis.  She also have Raynaud's phenomenon She has never been put on prednisone or other immunologic Autoimmune disorder seems to run in her family Her mother have autoimmune disorder and had blood clots The patient had multiple surgeries in the past and had been pregnant She has never been diagnosed with blood clots She is on chronic hormone replacement therapy Since she saw Dr. Marin Garner, she started taking 2 tablets of 81 mg aspirin She had prior surgeries before and never had perioperative thromboembolic events.  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 Janice Garner   COLONOSCOPY  03/28/2008   PELVIC LAPAROSCOPY     REDUCTION MAMMAPLASTY      SOCIAL HISTORY: Social History   Socioeconomic History   Marital status: Married    Spouse name: Not on file   Number of children: 1   Years of education: Not on file   Highest education level: Not on file  Occupational History  Occupation: Press photographer  Tobacco Use   Smoking status: Never   Smokeless tobacco: Never  Vaping Use   Vaping Use: Never used  Substance and Sexual Activity   Alcohol use: Yes    Alcohol/week: 2.0 - 3.0 standard drinks    Types: 2 - 3 Standard drinks or equivalent per week   Drug use: No   Sexual activity: Yes    Partners: Male     Birth control/protection: Surgical    Comment: TAH  Other Topics Concern   Not on file  Social History Narrative   Not on file   Social Determinants of Health   Financial Resource Strain: Not on file  Food Insecurity: Not on file  Transportation Needs: Not on file  Physical Activity: Not on file  Stress: Not on file  Social Connections: Not on file  Intimate Partner Violence: Not on file    FAMILY HISTORY: Family History  Problem Relation Age of Onset   Clotting disorder Mother    Prostate cancer Father    Colon polyps Father    Cervical cancer Maternal Grandmother    Healthy Daughter     ALLERGIES:  is allergic to penicillins, adhesive [tape], and latex.  MEDICATIONS:  Current Outpatient Medications  Medication Sig Dispense Refill   acetaminophen (TYLENOL) 500 MG tablet Take by mouth as needed.      aspirin 81 MG tablet Take 81 mg by mouth 2 (two) times daily.      estrogens, conjugated, (PREMARIN) 0.625 MG tablet TAKE 1 TABLET (0.625 MG TOTAL) BY MOUTH DAILY. 90 tablet 0   hydroxychloroquine (PLAQUENIL) 200 MG tablet TAKE 1 TABLET BY MOUTH EVERY DAY 90 tablet 0   ibuprofen (ADVIL) 400 MG tablet Take by mouth as needed.      Multiple Vitamins-Minerals (PRESERVISION AREDS) CAPS Take 1 tablet by mouth daily.      Vitamin D, Cholecalciferol, 25 MCG (1000 UT) TABS Take 2 tablets by mouth daily.      No current facility-administered medications for this visit.    REVIEW OF SYSTEMS:   Constitutional: Denies fevers, chills or abnormal night sweats Eyes: Denies blurriness of vision, double vision or watery eyes Ears, nose, mouth, throat, and face: Denies mucositis or sore throat Respiratory: Denies cough, dyspnea or wheezes Cardiovascular: Denies palpitation, chest discomfort or lower extremity swelling Gastrointestinal:  Denies nausea, heartburn or change in bowel habits Skin: Denies abnormal skin rashes Lymphatics: Denies new lymphadenopathy or easy  bruising Neurological:Denies numbness, tingling or new weaknesses Behavioral/Psych: Mood is stable, no new changes  All other systems were reviewed with the patient and are negative.  PHYSICAL EXAMINATION: ECOG PERFORMANCE STATUS: 1 - Symptomatic but completely ambulatory  Vitals:   03/12/21 1317  BP: (!) 141/67  Pulse: 64  Resp: 18  Temp: 98.3 F (36.8 C)  SpO2: 100%   Filed Weights   03/12/21 1317  Weight: 139 lb 3.2 oz (63.1 kg)    GENERAL:alert, no distress and comfortable SKIN: skin color, texture, turgor are normal, no rashes or significant lesions EYES: normal, conjunctiva are pink and non-injected, sclera clear OROPHARYNX:no exudate, no erythema and lips, buccal mucosa, and tongue normal  NECK: supple, thyroid normal size, non-tender, without nodularity LYMPH:  no palpable lymphadenopathy in the cervical, axillary or inguinal LUNGS: clear to auscultation and percussion with normal breathing effort HEART: regular rate & rhythm and no murmurs and no lower extremity edema ABDOMEN:abdomen soft, non-tender and normal bowel sounds Musculoskeletal:no cyanosis of digits and no clubbing.  She has  cold extremities but no presence of digital ischemia PSYCH: alert & oriented x 3 with fluent speech NEURO: no focal motor/sensory deficits  LABORATORY DATA:  I have reviewed the data as listed  RADIOGRAPHIC STUDIES: I have personally reviewed the radiological images as listed and agreed with the findings in the report. XR KNEE 3 VIEW RIGHT  Result Date: 02/20/2021 Mild medial compartment narrowing was noted.  No chondrocalcinosis was noted.  Mild patellofemoral narrowing was noted. Impression: These findings are consistent mild osteoarthritis and mild chondromalacia patella.

## 2021-03-27 ENCOUNTER — Ambulatory Visit (HOSPITAL_BASED_OUTPATIENT_CLINIC_OR_DEPARTMENT_OTHER): Payer: 59 | Admitting: Obstetrics & Gynecology

## 2021-03-28 ENCOUNTER — Other Ambulatory Visit: Payer: Self-pay

## 2021-03-28 ENCOUNTER — Encounter (HOSPITAL_BASED_OUTPATIENT_CLINIC_OR_DEPARTMENT_OTHER): Payer: Self-pay | Admitting: Obstetrics & Gynecology

## 2021-03-28 ENCOUNTER — Ambulatory Visit (INDEPENDENT_AMBULATORY_CARE_PROVIDER_SITE_OTHER): Payer: 59 | Admitting: Obstetrics & Gynecology

## 2021-03-28 VITALS — BP 142/76 | HR 63 | Ht 65.5 in | Wt 138.0 lb

## 2021-03-28 DIAGNOSIS — Z86018 Personal history of other benign neoplasm: Secondary | ICD-10-CM

## 2021-03-28 DIAGNOSIS — Z1231 Encounter for screening mammogram for malignant neoplasm of breast: Secondary | ICD-10-CM | POA: Diagnosis not present

## 2021-03-28 DIAGNOSIS — R760 Raised antibody titer: Secondary | ICD-10-CM

## 2021-03-28 DIAGNOSIS — Z01419 Encounter for gynecological examination (general) (routine) without abnormal findings: Secondary | ICD-10-CM

## 2021-03-28 DIAGNOSIS — Z79890 Hormone replacement therapy (postmenopausal): Secondary | ICD-10-CM | POA: Diagnosis not present

## 2021-03-28 DIAGNOSIS — Z780 Asymptomatic menopausal state: Secondary | ICD-10-CM

## 2021-03-28 DIAGNOSIS — Z90710 Acquired absence of both cervix and uterus: Secondary | ICD-10-CM

## 2021-03-28 MED ORDER — ESTROGENS CONJUGATED 0.625 MG PO TABS: ORAL_TABLET | ORAL | 3 refills | Status: DC

## 2021-03-28 NOTE — Progress Notes (Signed)
60 y.o. G1P1 Married White or Caucasian female here for annual exam.  H/o antiphospholipid antibody positive testing.  She saw Dr. Alvy Bimler for consult about this and and upcoming trip to [place].  Pt continues to take two aspirin a day.  We discussed lower the premarin dosage.  She does not want to do this right now.    Denies vaginal bleeding.    Patient's last menstrual period was 10/11/2012.          Sexually active: Yes.    The current method of family planning is status post hysterectomy.    Exercising: yes Smoker:  no  Health Maintenance: Pap:  not indicated History of abnormal Pap:  no MMG:  02/15/2020 Negative.  Pt aware this is due.   Colonoscopy:  12/18/2018, Dr. Henrene Pastor.  Follow up 10 years Screening Labs: done with different providers over the last two years   reports that she has never smoked. She has never used smokeless tobacco. She reports current alcohol use of about 2.0 - 3.0 standard drinks per week. She reports that she does not use drugs.  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 Janice Coffin   COLONOSCOPY  03/28/2008   PELVIC LAPAROSCOPY     REDUCTION MAMMAPLASTY      Current Outpatient Medications  Medication Sig Dispense Refill   acetaminophen (TYLENOL) 500 MG tablet Take by mouth as needed.      aspirin 81 MG tablet Take 81 mg by mouth 2 (two) times daily.      hydroxychloroquine (PLAQUENIL) 200 MG tablet TAKE 1 TABLET BY MOUTH EVERY DAY 90 tablet 0   ibuprofen  (ADVIL) 400 MG tablet Take by mouth as needed.      Multiple Vitamins-Minerals (PRESERVISION AREDS) CAPS Take 1 tablet by mouth daily.      Vitamin D, Cholecalciferol, 25 MCG (1000 UT) TABS Take 2 tablets by mouth daily.      estrogens, conjugated, (PREMARIN) 0.625 MG tablet TAKE 1 TABLET (0.625 MG TOTAL) BY MOUTH DAILY. 90 tablet 3   No current facility-administered medications for this visit.    Family History  Problem Relation Age of Onset   Clotting disorder Mother    Prostate cancer Father    Colon polyps Father    Cervical cancer Maternal Grandmother    Healthy Daughter     Review of Systems  All other systems reviewed and are negative.  Exam:   BP (!) 142/76 (BP Location: Left Arm, Patient Position: Sitting, Cuff Size: Normal)   Pulse 63   Ht 5' 5.5" (1.664 m)   Wt 138 lb (62.6 kg)   LMP 10/11/2012   BMI 22.62 kg/m   Height: 5' 5.5" (166.4 cm)  General appearance: alert, cooperative and appears stated age Head: Normocephalic, without obvious abnormality, atraumatic Neck: no adenopathy, supple, symmetrical, trachea midline and thyroid normal to inspection and palpation Lungs: clear to auscultation bilaterally  Breasts: normal appearance, no masses or tenderness Heart: regular rate and rhythm Abdomen: soft, non-tender; bowel sounds normal; no masses,  no organomegaly Extremities: extremities normal, atraumatic, no cyanosis or edema Skin: Skin color, texture, turgor normal. No rashes or lesions Lymph nodes: Cervical, supraclavicular, and axillary nodes normal. No abnormal inguinal nodes palpated Neurologic: Grossly normal   Pelvic: External genitalia:  no lesions              Urethra:  normal appearing urethra with no masses, tenderness or lesions              Bartholins and Skenes: normal                 Vagina: normal appearing vagina with normal color and no discharge, no lesions              Cervix: absent              Pap taken: No. Bimanual Exam:  Uterus:   uterus absent              Adnexa: no mass, fullness, tenderness               Rectovaginal: Confirms               Anus:  normal sphincter tone, no lesions  Chaperone, Octaviano Batty, CMA, was present for exam.  Assessment/Plan: 1. Well woman exam with routine gynecological exam - pap is not indicated - MMG due.  Pt aware.  Order placed. - Colonoscopy 2020, following up 10 years - BMD done earlier thsi year - care gaps reviewed/updated  2.  Hormone replacement therapy (HRT) - estrogens, conjugated, (PREMARIN) 0.625 MG tablet; TAKE 1 TABLET (0.625 MG TOTAL) BY MOUTH DAILY.  Dispense: 90 tablet; Refill: 3  3. Postmenopausal  4. Antiphospholipid antibody positive - followed by Dr. Bronson Curb and Dr. Alvy Bimler.  Also saw Dr. Marin Olp in the past to ensure continuing HRT was appropriate.  5. H/O abdominal hysterectomy  6. History of myxoma, s/p resection 12/2019

## 2021-04-30 ENCOUNTER — Ambulatory Visit (HOSPITAL_BASED_OUTPATIENT_CLINIC_OR_DEPARTMENT_OTHER): Admission: RE | Admit: 2021-04-30 | Payer: 59 | Source: Ambulatory Visit | Admitting: Radiology

## 2021-04-30 ENCOUNTER — Encounter (HOSPITAL_BASED_OUTPATIENT_CLINIC_OR_DEPARTMENT_OTHER): Payer: Self-pay

## 2021-05-02 ENCOUNTER — Ambulatory Visit (HOSPITAL_BASED_OUTPATIENT_CLINIC_OR_DEPARTMENT_OTHER)
Admission: RE | Admit: 2021-05-02 | Discharge: 2021-05-02 | Disposition: A | Discharge status: Home / Self Care | Payer: 59 | Source: Ambulatory Visit | Attending: Obstetrics & Gynecology | Admitting: Obstetrics & Gynecology

## 2021-05-02 ENCOUNTER — Other Ambulatory Visit: Payer: Self-pay

## 2021-05-02 DIAGNOSIS — Z1231 Encounter for screening mammogram for malignant neoplasm of breast: Secondary | ICD-10-CM | POA: Insufficient documentation

## 2021-05-02 IMAGING — MG DIGITAL SCREENING BREAST BILAT IMPLANT W/ TOMO W/ CAD
8 of 12 series · 8 of 28 positions shown · non-contrast
Comparison: Previous exams.

CLINICAL DATA: Screening.

EXAM:
DIGITAL SCREENING BILATERAL MAMMOGRAM WITH IMPLANTS, CAD AND
TOMOSYNTHESIS
TECHNIQUE: Bilateral screening digital craniocaudal and mediolateral oblique
mammograms were obtained. Bilateral screening digital breast
tomosynthesis was performed. The images were evaluated with
computer-aided detection. Standard and/or implant displaced views
were performed.

[L CC]
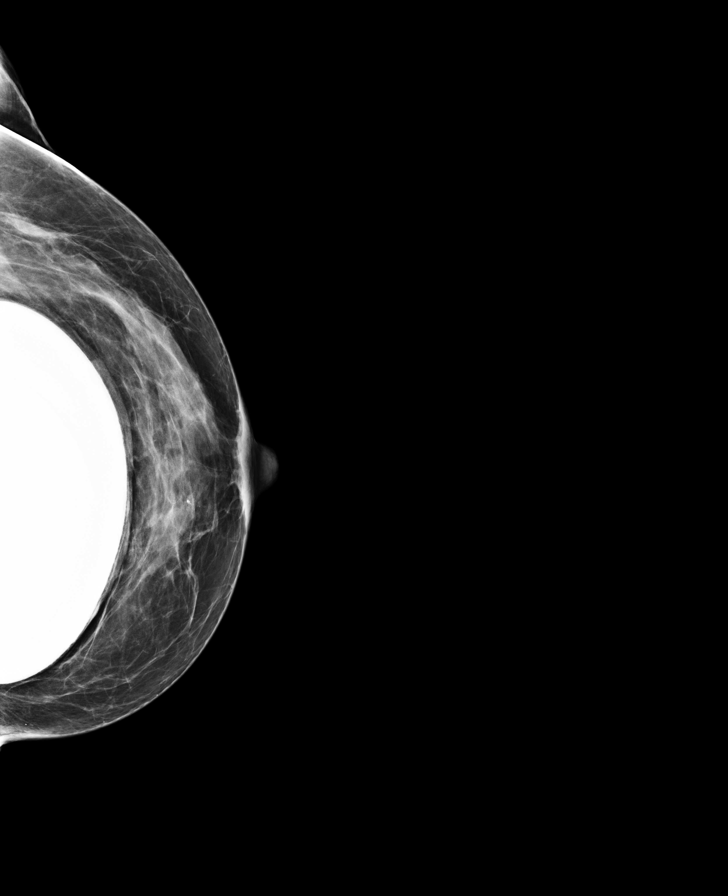

[R MLO]
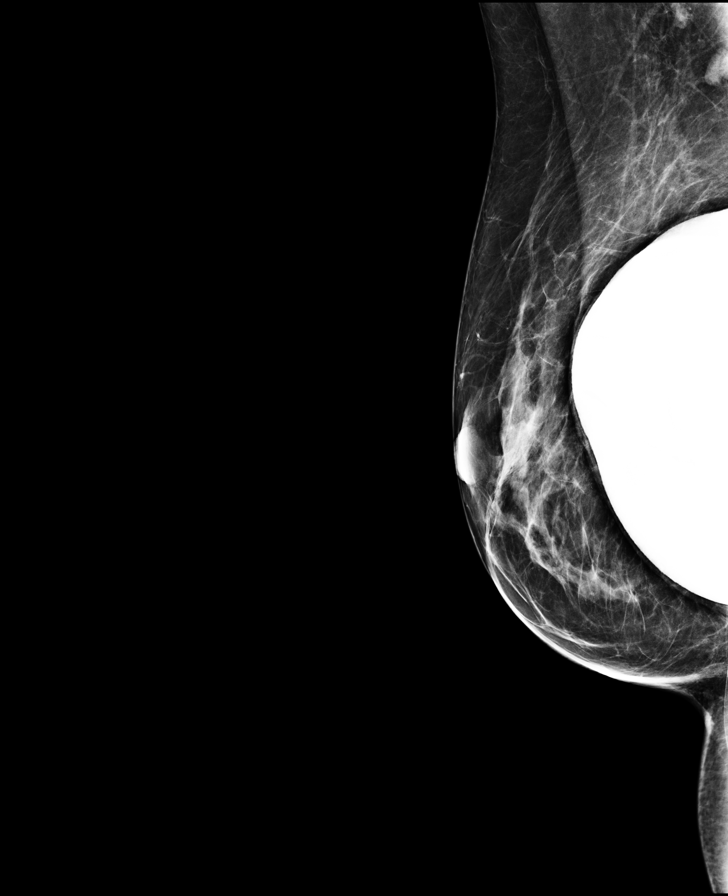

[L MLO]
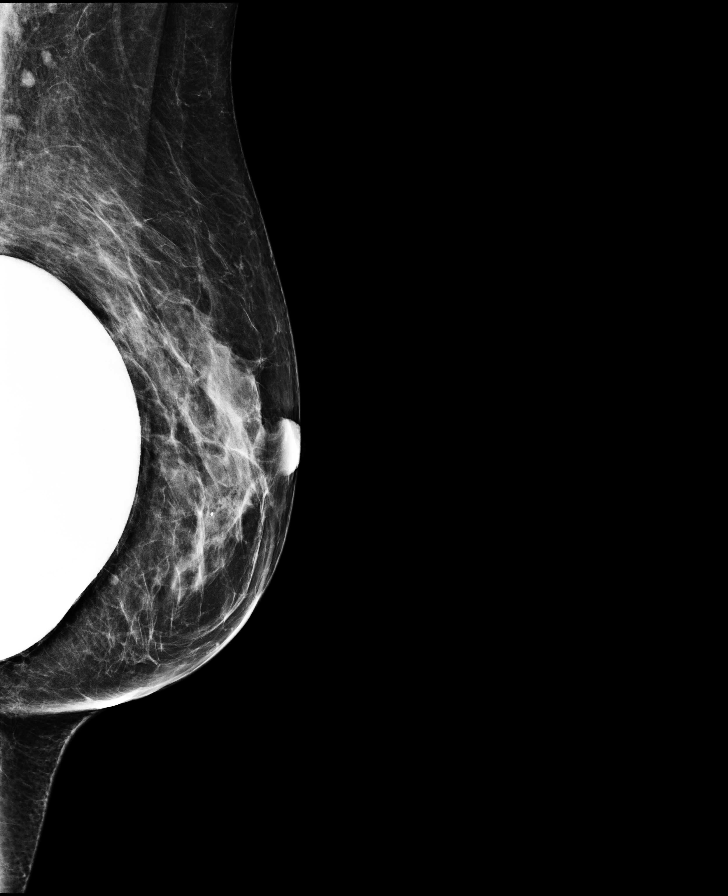

[R CC]
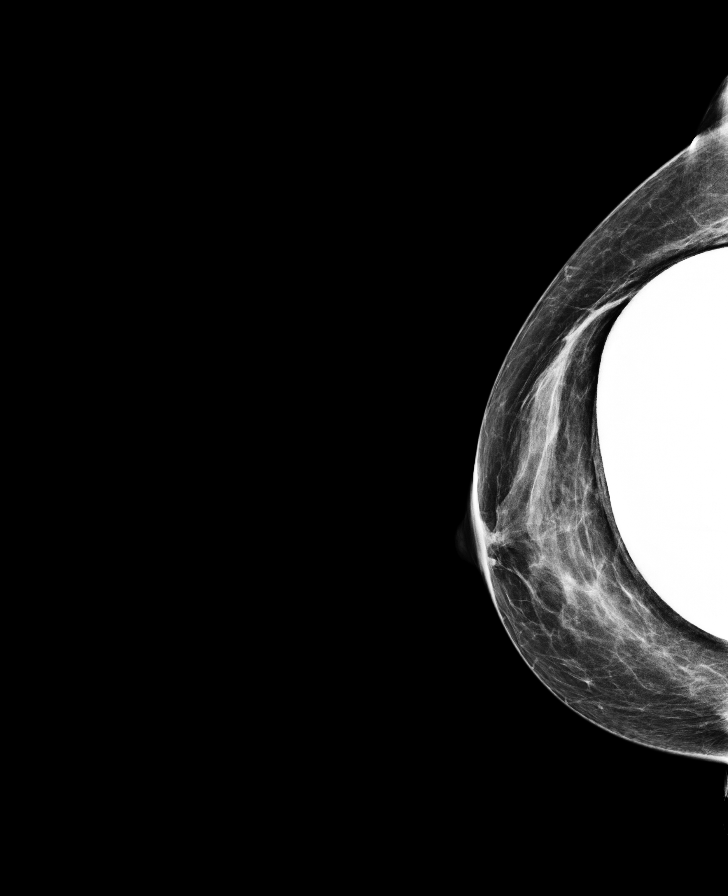

[L CC synth-2D]
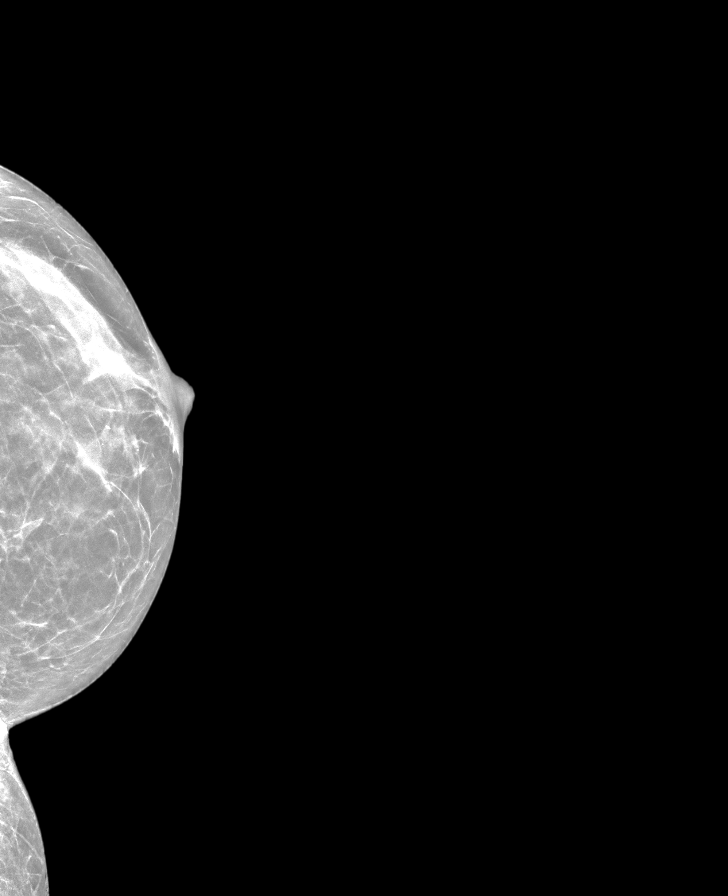

[R CC synth-2D]
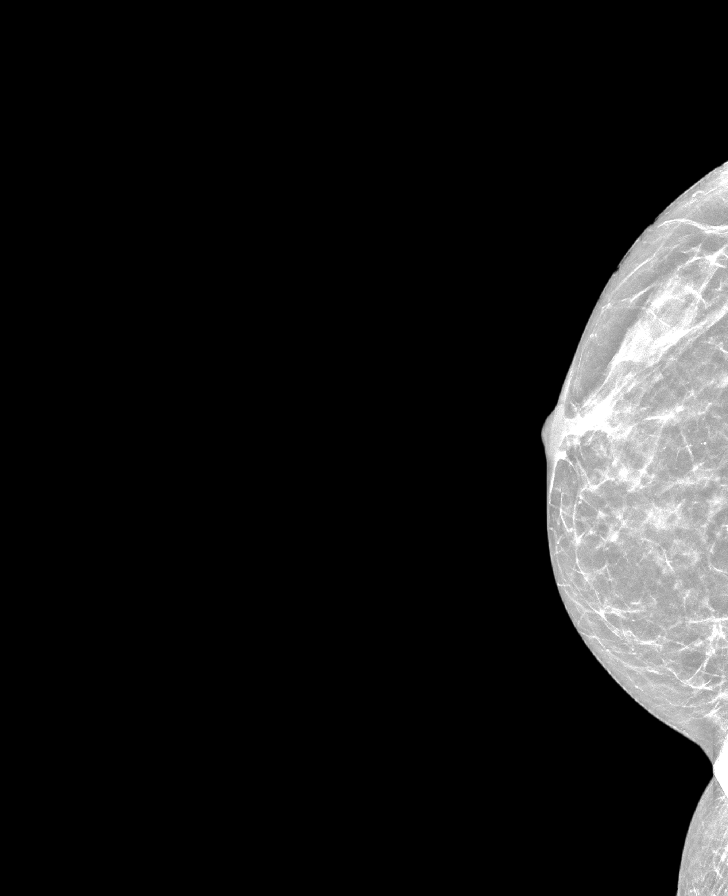

[R MLO synth-2D]
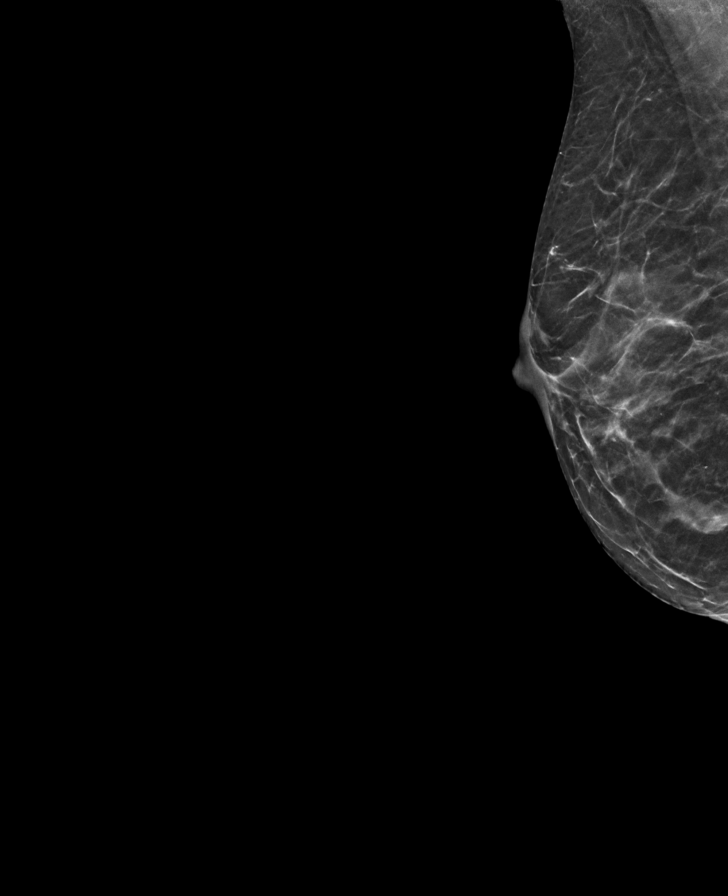

[L MLO synth-2D]
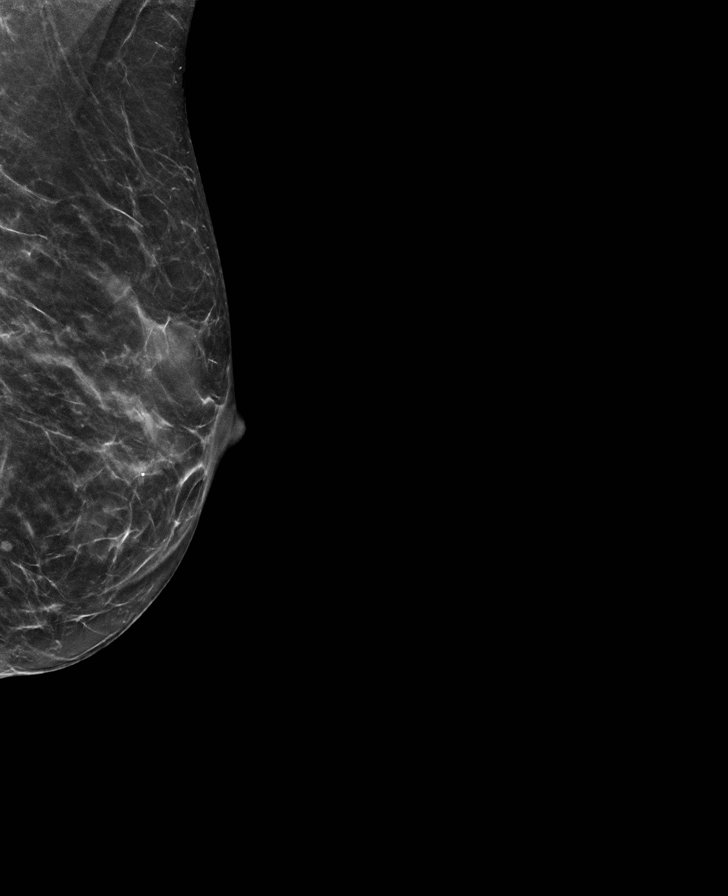

[8 of 28 positions shown; findings below may reference images not displayed]

ACR Breast Density Category c: The breast tissue is heterogeneously
dense, which may obscure small masses.
FINDINGS: The patient has bilateral subpectoral silicone implants. There are
no findings suspicious for malignancy.
IMPRESSION: No mammographic evidence of malignancy. A result letter of this
screening mammogram will be mailed directly to the patient.

RECOMMENDATION:
Screening mammogram in one year. (Code:[CL])

BI-RADS CATEGORY  1:  Negative.

## 2021-05-29 ENCOUNTER — Encounter: Payer: Self-pay | Admitting: Rheumatology

## 2021-05-29 DIAGNOSIS — G8929 Other chronic pain: Secondary | ICD-10-CM

## 2021-05-29 DIAGNOSIS — M25561 Pain in right knee: Secondary | ICD-10-CM

## 2021-05-29 NOTE — Telephone Encounter (Signed)
Please notify patient that we will try to schedule MRI of her right knee joint.  She has been experiencing severe pain and discomfort.  She had an adequate response to cortisone injection.  Is a scheduled MRI of the right knee joint to rule out internal derangement.

## 2021-06-03 ENCOUNTER — Ambulatory Visit (HOSPITAL_COMMUNITY)
Admission: RE | Admit: 2021-06-03 | Discharge: 2021-06-03 | Disposition: A | Discharge status: Home / Self Care | Payer: 59 | Source: Ambulatory Visit | Attending: Rheumatology | Admitting: Rheumatology

## 2021-06-03 DIAGNOSIS — M25561 Pain in right knee: Secondary | ICD-10-CM | POA: Insufficient documentation

## 2021-06-03 DIAGNOSIS — G8929 Other chronic pain: Secondary | ICD-10-CM | POA: Diagnosis present

## 2021-06-03 IMAGING — MR MR KNEE*R* W/O CM
6 series · 38 of 40 positions shown · non-contrast
Comparison: X-ray knee [DATE].

CLINICAL DATA: Chronic right medial knee pain for 8 months.

EXAM:
MRI OF THE RIGHT KNEE WITHOUT CONTRAST
TECHNIQUE: Multiplanar, multisequence MR imaging of the knee was performed. No
intravenous contrast was administered.

[Series 5: T2 fat-sat · axial · right · 4.0mm · 0.50mm/px · z∈[-84,+56]mm · 9 of 33 slices shown (1 of 3)]
[im 1/33]
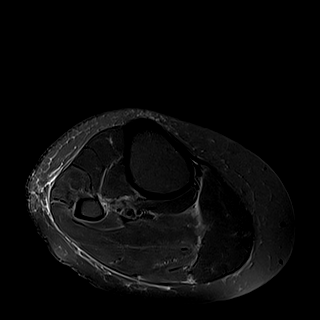
[im 5/33]
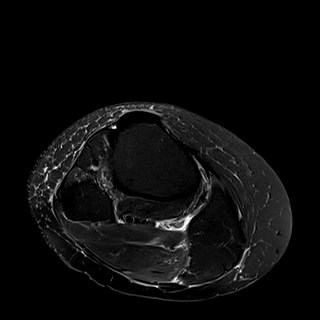
[im 9/33]
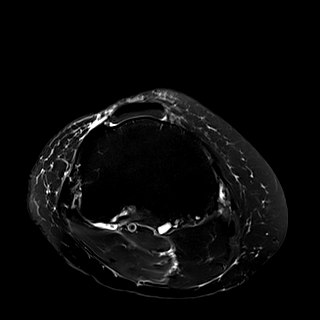
[im 13/33]
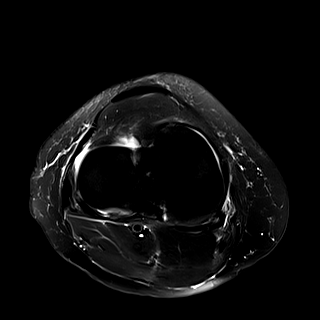
[im 17/33]
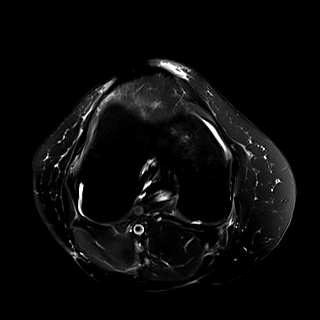
[im 21/33]
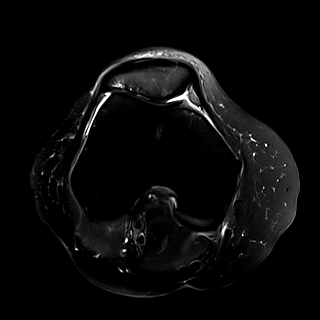
[im 25/33]
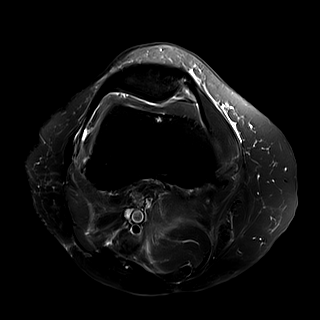
[im 29/33]
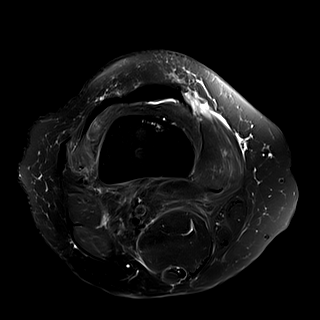
[im 33/33]
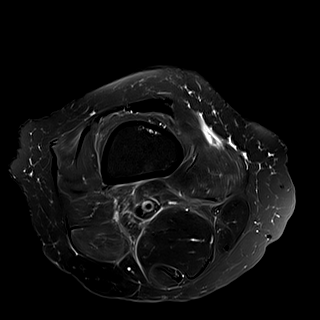

[Series 6: T1 · coronal · right · 4.0mm · 0.29mm/px · 5 of 25 slices shown]
[im 1/25]
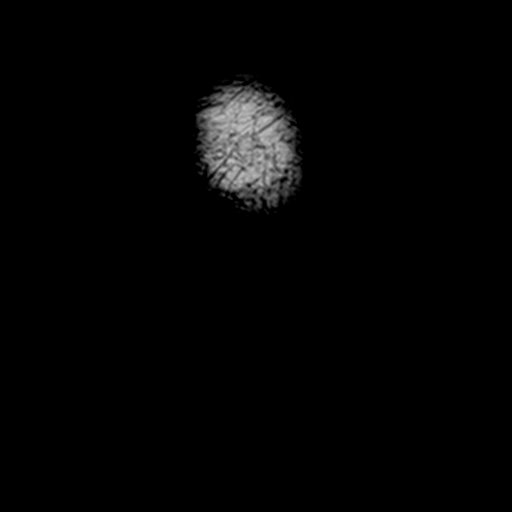
[im 5/25]
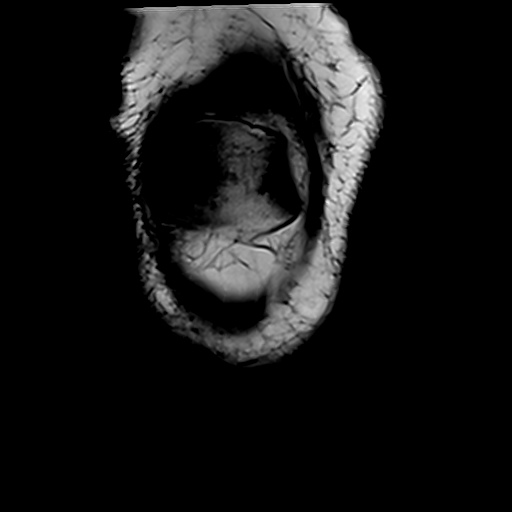
[im 9/25]
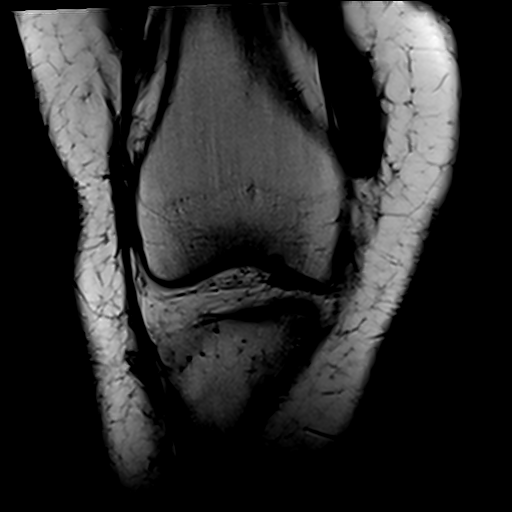
[im 13/25]
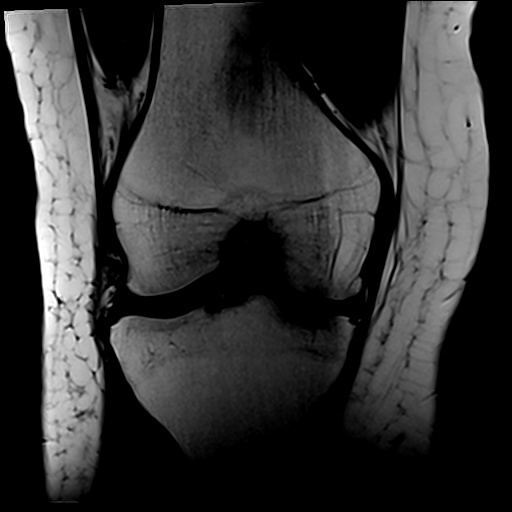
[im 17/25]
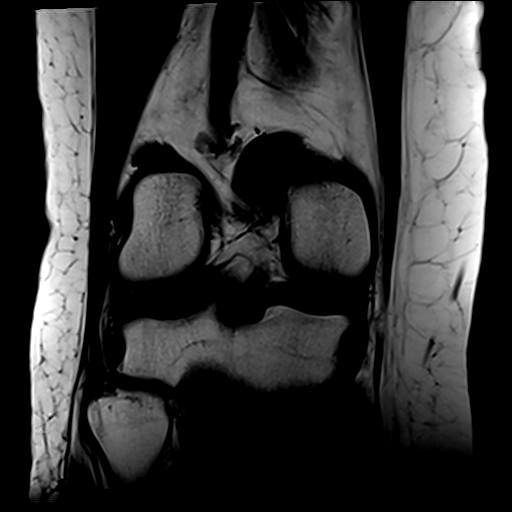

[Series 7: T2 fat-sat · coronal · right · 4.0mm · 0.59mm/px · 7 of 25 slices shown (2 of 3)]
[im 1/25]
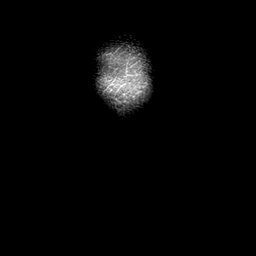
[im 5/25]
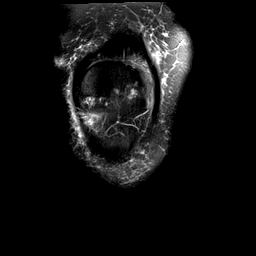
[im 9/25]
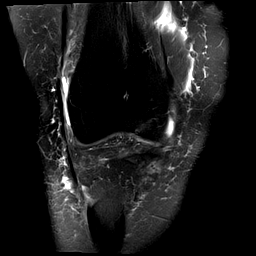
[im 13/25]
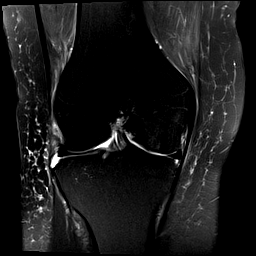
[im 17/25]
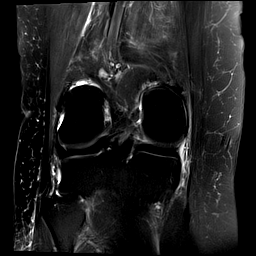
[im 21/25]
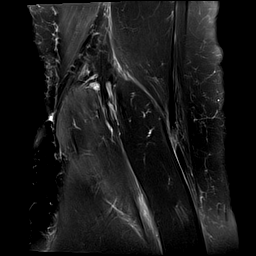
[im 25/25]
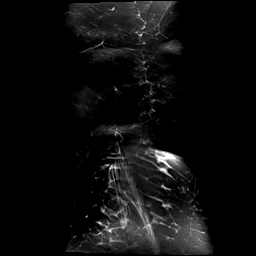

[Series 8: PD fat-sat · coronal · right · 3.0mm · 0.47mm/px · 7 of 25 slices shown (1 of 2)]
[im 1/25]
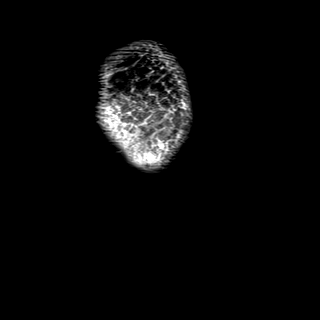
[im 5/25]
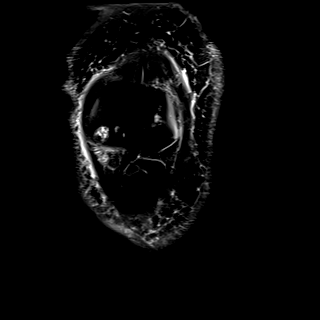
[im 9/25]
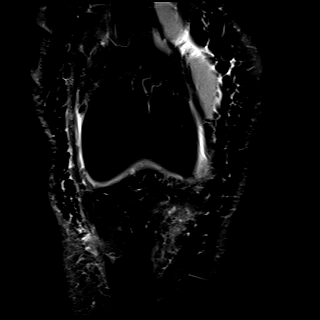
[im 13/25]
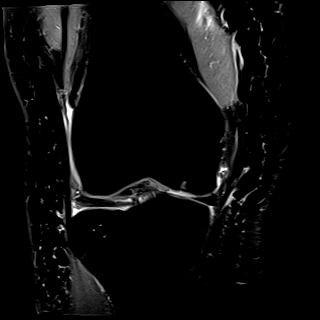
[im 17/25]
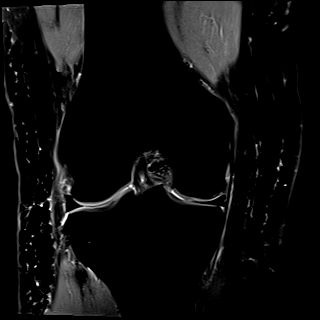
[im 21/25]
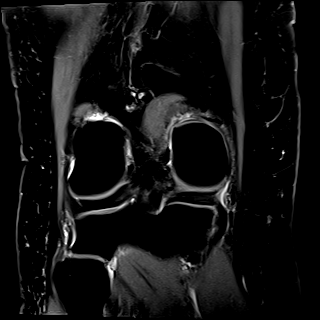
[im 25/25]
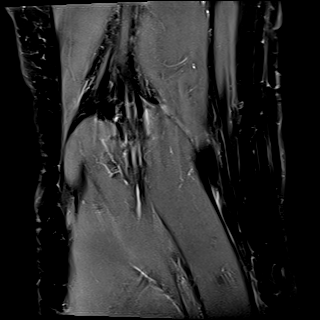

[Series 9: PD fat-sat · sagittal · right · 4.0mm · 0.47mm/px · 5 of 20 slices shown (2 of 2)]
[im 1/20]
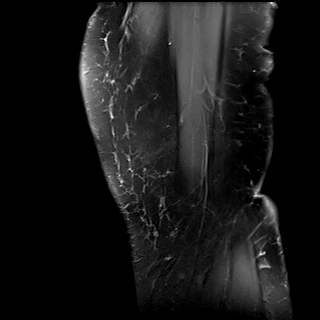
[im 5/20]
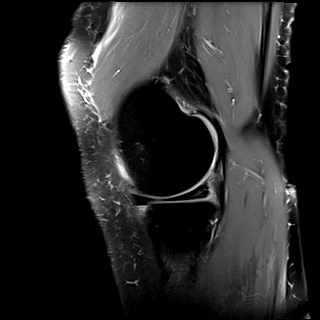
[im 10/20]
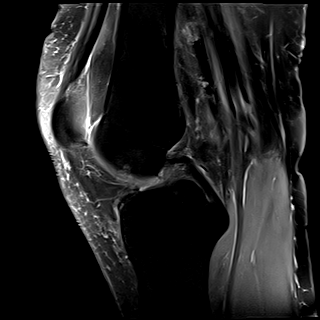
[im 15/20]
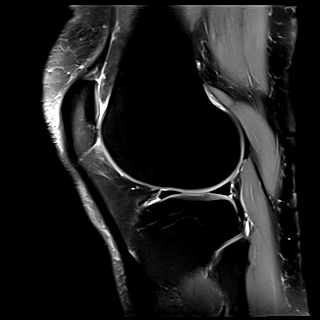
[im 20/20]
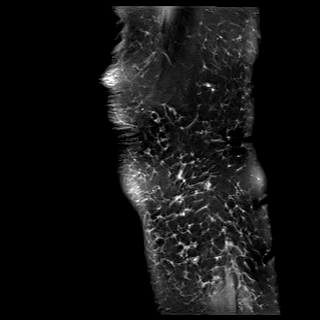

[Series 10: T2 fat-sat · sagittal · right · 4.0mm · 0.47mm/px · 5 of 20 slices shown (3 of 3)]
[im 1/20]
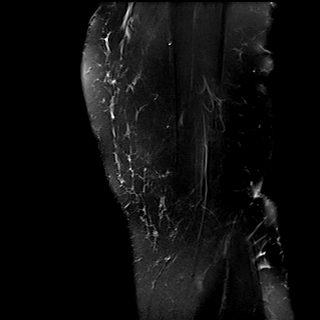
[im 5/20]
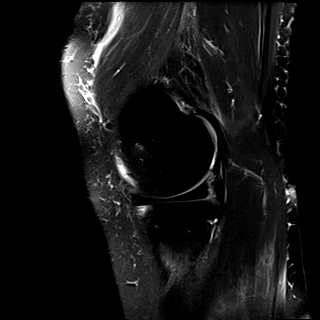
[im 10/20]
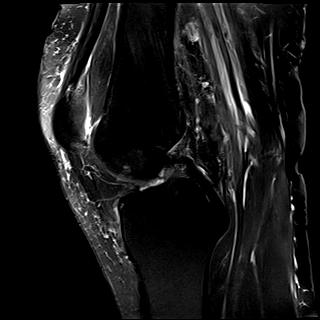
[im 15/20]
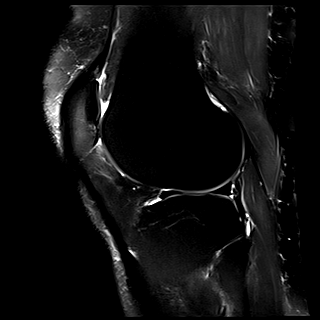
[im 20/20]
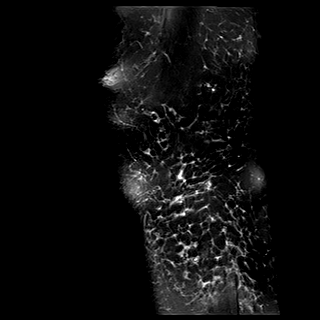

[38 of 40 positions shown; findings below may reference images not displayed]

FINDINGS: MENISCI

Medial meniscus: Degeneration of the posterior horn of the medial
meniscus. Small vertical tear of the free edge of the posterior horn
of the medial meniscus.

Lateral meniscus: Intact.

LIGAMENTS

Cruciates: Intact ACL and PCL.

Collaterals: Medial collateral ligament is intact. Lateral
collateral ligament complex is intact.

CARTILAGE

Patellofemoral: High-grade partial-thickness cartilage loss of the
lateral patellofemoral compartment with subchondral reactive marrow
changes in the lateral patellar facet. Partial-thickness cartilage
loss of the patellar apex and medial patellar facet with subchondral
reactive marrow changes.

Medial: Partial-thickness cartilage loss of the medial femorotibial
compartment.

Lateral: No chondral defect.

Joint: No joint effusion. Mild edema in superolateral Hoffa's fat.
No plical thickening.

Popliteal Fossa: No Baker's cyst.  Intact popliteus tendon.

Extensor Mechanism: Intact quadriceps tendon and patellar tendon.

Bones: No other focal marrow signal abnormality. No fracture or
dislocation.

Other: None.
IMPRESSION: 1. Degeneration of the posterior horn of medial meniscus. Small
vertical tear of the free edge of the posterior horn of the medial
meniscus.
2. High-grade partial-thickness cartilage loss of the lateral
patellofemoral compartment with subchondral reactive marrow changes
in the lateral patellar facet. Partial-thickness cartilage loss of
the patellar apex and medial patellar facet with subchondral
reactive marrow changes.
3. Partial-thickness cartilage loss of the medial femorotibial
compartment.

## 2021-06-04 ENCOUNTER — Telehealth: Payer: Self-pay | Admitting: *Deleted

## 2021-06-04 DIAGNOSIS — M25561 Pain in right knee: Secondary | ICD-10-CM

## 2021-06-04 DIAGNOSIS — M1711 Unilateral primary osteoarthritis, right knee: Secondary | ICD-10-CM

## 2021-06-04 DIAGNOSIS — G8929 Other chronic pain: Secondary | ICD-10-CM

## 2021-06-04 NOTE — Progress Notes (Signed)
MRI of the knee joint shows osteoarthritis and a small meniscal tear.  I would recommend evaluation by an orthopedic surgeon.  Please refer patient to the orthopedic surgeon of her choice.

## 2021-06-04 NOTE — Telephone Encounter (Signed)
-----   Message from Bo Merino, MD sent at 06/04/2021  1:51 PM EST ----- MRI of the knee joint shows osteoarthritis and a small meniscal tear.  I would recommend evaluation by an orthopedic surgeon.  Please refer patient to the orthopedic surgeon of her choice.

## 2021-06-09 ENCOUNTER — Other Ambulatory Visit: Payer: Self-pay | Admitting: Physician Assistant

## 2021-06-09 DIAGNOSIS — M359 Systemic involvement of connective tissue, unspecified: Secondary | ICD-10-CM

## 2021-06-11 NOTE — Telephone Encounter (Signed)
Next Visit: 07/23/2021  Last Visit: 02/20/2021  Labs: 03/23/2021 CBC and CMP WNL  Eye exam: 12/28/2020 WNL    Current Dose per office note 02/20/2021: Plaquenil 200 mg 1 tablet by mouth daily  DX: Autoimmune disease   Last Fill: 03/08/2021  Okay to refill Plaquenil?

## 2021-06-13 ENCOUNTER — Encounter: Payer: 59 | Admitting: Plastic Surgery

## 2021-06-20 ENCOUNTER — Encounter: Payer: Self-pay | Admitting: Plastic Surgery

## 2021-06-20 ENCOUNTER — Encounter: Payer: 59 | Admitting: Plastic Surgery

## 2021-06-27 ENCOUNTER — Ambulatory Visit: Payer: 59 | Admitting: Orthopaedic Surgery

## 2021-07-09 ENCOUNTER — Ambulatory Visit: Payer: 59 | Admitting: Orthopaedic Surgery

## 2021-07-09 ENCOUNTER — Telehealth: Payer: Self-pay

## 2021-07-09 DIAGNOSIS — M1711 Unilateral primary osteoarthritis, right knee: Secondary | ICD-10-CM

## 2021-07-09 DIAGNOSIS — G8929 Other chronic pain: Secondary | ICD-10-CM | POA: Diagnosis not present

## 2021-07-09 DIAGNOSIS — M25561 Pain in right knee: Secondary | ICD-10-CM | POA: Diagnosis not present

## 2021-07-09 NOTE — Telephone Encounter (Signed)
Left knee gel injection ?

## 2021-07-09 NOTE — Progress Notes (Signed)
Office Visit Note   Patient: Alexandra Garner           Date of Birth: 1960-12-05           MRN: [id number] Visit Date: 07/09/2021              Requested by: Bo Merino, MD [address] [place],   [id number] PCP: Leamon Arnt, MD   Assessment & Plan: Visit Diagnoses:  1. Chronic pain of right knee   2. Unilateral primary osteoarthritis, right knee     Plan:   She does have osteoarthritis of her right knee and I have recommended hyaluronic acid given the failure of other conservative treatment measures including activity modification, quad strengthening exercises, anti-inflammatories and a steroid injection.  This is the next logical step given her mild to moderate arthritis in the right knee.  She will avoid high impact aerobic activities in the interim.  She agrees with this treatment plan.  I gave her handout about hyaluronic acid we will see if we get this approved.  We will see her in follow-up for that injection once it is approved.  Follow-Up Instructions: No follow-ups on file.   Orders:  No orders of the defined types were placed in this encounter.  No orders of the defined types were placed in this encounter.     Procedures: No procedures performed   Clinical Data: No additional findings.   Subjective: Chief Complaint  Patient presents with   Right Knee - Pain  The patient is a very pleasant and active 61 year old female who comes in for evaluation treatment of chronic right knee pain.  She is a patient of rheumatology and Dr. Estanislado Pandy sent her our way for further evaluation treatment.  She has plain films on the canopy system and MRI of her right knee.  Most of her pain is along the medial joint line.  There is no locking catching.  It is activity related pain.  She is on rheumatologic medications and is followed closely for those issues.  She is not a runner.  She has not injured this right knee before and has not had surgery on the  right knee before.  She did have a steroid injection in her right knee about 4 months ago.  That did help but only short-term.  HPI  Review of Systems There is currently listed no fever, chills, nausea, vomiting  Objective: Vital Signs: LMP 10/11/2012   Physical Exam She is alert and orient x3 and in no acute distress Ortho Exam Examination of her left knee is normal.  Examination right knee shows significant medial joint line tenderness but a negative McMurray's and negative Lachman's exam.  Her range of motion is excellent.  There is no effusion. Specialty Comments:  No specialty comments available.  Imaging: No results found. X-rays and MRI of the canopy system reviewed of her right knee.  The plain film showed well-maintained joint space.  The MRI shows some partial cartilage loss in the medial compartment of her knee and the patellofemoral joint.  There are some degenerative signal changes in the meniscus medially but no meniscal tear.  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   Hormone replacement therapy (HRT) 11/14/2017   History of migraine 11/07/2017   Autoimmune disease ([place]) 10/20/2016   High risk medication use 09/26/2016  Raynaud's disease without gangrene 09/19/2016   Primary osteoarthritis of both feet 09/19/2016   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   Family history of macular degeneration 02/03/2015   Primary localized osteoarthrosis of hand 12/23/2013   History of colonic polyps 11/01/2008   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   Autoimmune disease ([place])    in the Lupus family   Fibroid    Hand pain    left hand-had 2 cortisone injections   Hx of migraines    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 Janice Coffin   COLONOSCOPY  03/28/2008   PELVIC LAPAROSCOPY     REDUCTION MAMMAPLASTY     Social History   Occupational History   Occupation: Press photographer  Tobacco Use   Smoking status: Never   Smokeless tobacco: Never  Vaping Use   Vaping Use: Never used  Substance and Sexual Activity   Alcohol use: Yes    Alcohol/week: 2.0 - 3.0 standard drinks    Types: 2 - 3 Standard drinks or equivalent per week   Drug use: No   Sexual activity: Yes    Partners: Male    Birth control/protection: Surgical    Comment: TAH

## 2021-07-09 NOTE — Progress Notes (Deleted)
Office Visit Note  Patient: Alexandra Garner             Date of Birth: 01-18-1961           MRN: [id number]             PCP: Leamon Arnt, MD Referring: Leamon Arnt, MD Visit Date: 07/23/2021 Occupation: @GUAROCC @  Subjective:  No chief complaint on file.   History of Present Illness: Alexandra Garner is a 61 y.o. female ***   Activities of Daily Living:  Patient reports morning stiffness for *** {minute/hour:19697}.   Patient {ACTIONS;DENIES/REPORTS:21021675::"Denies"} nocturnal pain.  Difficulty dressing/grooming: {ACTIONS;DENIES/REPORTS:21021675::"Denies"} Difficulty climbing stairs: {ACTIONS;DENIES/REPORTS:21021675::"Denies"} Difficulty getting out of chair: {ACTIONS;DENIES/REPORTS:21021675::"Denies"} Difficulty using hands for taps, buttons, cutlery, and/or writing: {ACTIONS;DENIES/REPORTS:21021675::"Denies"}  No Rheumatology ROS completed.   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/23/2013   History of colonic polyps 11/01/2008    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 Janice Coffin   COLONOSCOPY  03/28/2008   PELVIC LAPAROSCOPY     REDUCTION MAMMAPLASTY     Social History   Social History Narrative   Not on file   Immunization History  Administered Date(s) Administered   PFIZER(Purple Top)SARS-COV-2 Vaccination 07/20/2019, 08/11/2019, 03/30/2020, 10/30/2020   Tdap 10/01/2011   Zoster Recombinat (Shingrix) 04/15/2019, 06/17/2019     Objective: Vital Signs: LMP 10/11/2012    Physical Exam   Musculoskeletal Exam: ***  CDAI Exam: CDAI Score: -- Patient Global: --; Provider Global: -- Swollen: --; Tender: -- Joint Exam 07/23/2021   No joint exam has been documented for this visit   There is currently no information documented on the homunculus. Go to the Rheumatology activity and complete the homunculus joint exam.  Investigation: No additional findings.  Imaging: No results found.  Recent Labs: Lab Results  Component Value Date   WBC 5.1 02/20/2021   HGB 13.0 02/20/2021   PLT 271 02/20/2021   NA 139 02/20/2021   K 4.5 02/20/2021   CL 105 02/20/2021   CO2 28 02/20/2021   GLUCOSE 85 02/20/2021   BUN 10 02/20/2021   CREATININE 0.68 02/20/2021   BILITOT 0.5 02/20/2021  ALKPHOS 33 (L) 01/10/2020   AST 17 02/20/2021   ALT 13 02/20/2021   PROT 6.6 02/20/2021   ALBUMIN 3.9 01/10/2020   CALCIUM 9.5 02/20/2021   GFRAA 99 08/29/2020    Speciality Comments: Plaquenil eye exam: normal on 12/28/2020 [facility] [place] Follow up in 1 year.   Procedures:  No procedures performed Allergies: Penicillins, Adhesive [tape], and Latex   Assessment / Plan:     Visit Diagnoses: No diagnosis  found.  Orders: No orders of the defined types were placed in this encounter.  No orders of the defined types were placed in this encounter.   Face-to-face time spent with patient was *** minutes. Greater than 50% of time was spent in counseling and coordination of care.  Follow-Up Instructions: No follow-ups on file.   Earnestine Mealing, CMA  Note - This record has been created using Editor, commissioning.  Chart creation errors have been sought, but may not always  have been located. Such creation errors do not reflect on  the standard of medical care.

## 2021-07-13 NOTE — Telephone Encounter (Signed)
Noted  

## 2021-07-17 ENCOUNTER — Encounter: Payer: Self-pay | Admitting: Rheumatology

## 2021-07-17 ENCOUNTER — Telehealth (INDEPENDENT_AMBULATORY_CARE_PROVIDER_SITE_OTHER): Payer: 59 | Admitting: Family Medicine

## 2021-07-17 ENCOUNTER — Encounter: Payer: Self-pay | Admitting: Family Medicine

## 2021-07-17 VITALS — Temp 98.0°F

## 2021-07-17 DIAGNOSIS — U071 COVID-19: Secondary | ICD-10-CM

## 2021-07-17 MED ORDER — BENZONATATE 100 MG PO CAPS: ORAL_CAPSULE | ORAL | 0 refills | Status: DC

## 2021-07-17 MED ORDER — NIRMATRELVIR/RITONAVIR (PAXLOVID)TABLET: 3.0000 | ORAL_TABLET | Freq: Two times a day (BID) | ORAL | 0 refills | Status: AC

## 2021-07-17 NOTE — Patient Instructions (Addendum)
HOME CARE TIPS:   -I sent the medication(s) we discussed to your pharmacy: Meds ordered this encounter  Medications   benzonatate (TESSALON PERLES) 100 MG capsule    Sig: 1-2 capsules up to twice daily as needed    Dispense:  30 capsule    Refill:  0   nirmatrelvir/ritonavir EUA (PAXLOVID) 20 x 150 MG & 10 x 100MG TABS    Sig: Take 3 tablets by mouth 2 (two) times daily for 5 days. (Take nirmatrelvir 150 mg two tablets twice daily for 5 days and ritonavir 100 mg one tablet twice daily for 5 days) Patient GFR is > 60    Dispense:  30 tablet    Refill:  0     -I sent in the [place] treatment or referral you requested per our discussion. Please see the information provided below and discuss further with the pharmacist/treatment team.  -If taking Paxlovid, please review all medications, supplement and over the counter drugs with your pharmacist and ask them to check for any interactions.  *  -there is a chance of rebound illness after finishing your treatment. If you become sick again please isolate for an additional 5 days, plus 5 more days of masking.   -can use tylenol  if needed for fevers, aches and pains per instructions  -can use nasal saline a few times per day if you have nasal congestion  -stay hydrated, drink plenty of fluids and eat small healthy meals - avoid dairy  -stay home while sick, except to seek medical care. If you have COVID19, you will likely be contagious for 7-10 days. Flu or Influenza is likely contagious for about 7 days. Other respiratory viral infections remain contagious for 5-10+ days depending on the virus and many other factors. Wear a good mask that fits snugly (such as N95 or KN95) if around others to reduce the risk of transmission.  It was nice to meet you today, and I really hope you are feeling better soon. I help [facility] out with telemedicine visits on Tuesdays and Thursdays and am happy to help if you need a follow up virtual visit on those  days. Otherwise, if you have any concerns or questions following this visit please schedule a follow up visit with your Primary Care doctor or seek care at a local urgent care clinic to avoid delays in care.    Seek in person care or schedule a follow up video visit promptly if your symptoms worsen, new concerns arise or you are not improving with treatment. Call 911 and/or seek emergency care if your symptoms are severe or life threatening.  FACT SHEET FOR PATIENTS, PARENTS, AND CAREGIVERS EMERGENCY USE AUTHORIZATION (EUA) OF PAXLOVID FOR CORONAVIRUS DISEASE 2019 (COVID-19) You are being given this Fact Sheet because your healthcare provider believes it is necessary to provide you with PAXLOVID for the treatment of mild-to-moderate coronavirus disease (COVID-19) caused by the SARS-CoV-2 virus. This Fact Sheet contains information to help you understand the risks and benefits of taking the PAXLOVID you have received or may receive. The U.S. Food and Drug Administration (FDA) has issued an Emergency Use Authorization (EUA) to make PAXLOVID available during the COVID-19 pandemic (for more details about an EUA please see What is an Emergency Use Authorization? at the end of this document). PAXLOVID is not an FDA-approved medicine in the [place]. Read this Fact Sheet for information about PAXLOVID. Talk to your healthcare provider about your options or if you have any questions. It is  your choice to take PAXLOVID.  What is COVID-19? COVID-19 is caused by a virus called a coronavirus. You can get COVID-19 through close contact with another person who has the virus. COVID-19 illnesses have ranged from very mild-to-severe, including illness resulting in death. While information so far suggests that most COVID-19 illness is mild, serious illness can happen and may cause some of your other medical conditions to become worse. Older people and people of all ages with severe, long lasting  (chronic) medical conditions like heart disease, lung disease, and diabetes, for example seem to be at higher risk of being hospitalized for COVID-19.  What is PAXLOVID? PAXLOVID is an investigational medicine used to treat mild-to-moderate COVID-19 in adults and children [41 years of age and older weighing at least 57 pounds (81 kg)] with positive results of direct SARS-CoV-2 viral testing, and who are at high risk for progression to severe COVID-19, including hospitalization or death. PAXLOVID is investigational because it is still being studied. There is limited information about the safety and effectiveness of using PAXLOVID to treat people with mild-to-moderate COVID-19.  The FDA has authorized the emergency use of PAXLOVID for the treatment of mild-tomoderate COVID-19 in adults and children [37 years of age and older weighing at least 76 pounds (36 kg)] with a positive test for the virus that causes COVID-19, and who are at high risk for progression to severe COVID-19, including hospitalization or death, under an EUA. 1 Revised: 08 September 2020   What should I tell my healthcare provider before I take PAXLOVID? Tell your healthcare provider if you: ? Have any allergies ? Have liver or kidney disease ? Are pregnant or plan to become pregnant ? Are breastfeeding a child ? Have any serious illnesses  Tell your healthcare provider about all the medicines you take, including prescription and over-the-counter medicines, vitamins, and herbal supplements. Some medicines may interact with PAXLOVID and may cause serious side effects. Keep a list of your medicines to show your healthcare provider and pharmacist when you get a new medicine.  You can ask your healthcare provider or pharmacist for a list of medicines that interact with PAXLOVID. Do not start taking a new medicine without telling your healthcare provider. Your healthcare provider can tell you if it is safe to take PAXLOVID  with other medicines.  Tell your healthcare provider if you are taking combined hormonal contraceptive. PAXLOVID may affect how your birth control pills work. Females who are able to become pregnant should use another effective alternative form of contraception or an additional barrier method of contraception. Talk to your healthcare provider if you have any questions about contraceptive methods that might be right for you.  How do I take PAXLOVID? ? PAXLOVID consists of 2 medicines: nirmatrelvir and ritonavir. o Take 2 pink tablets of nirmatrelvir with 1 white tablet of ritonavir by mouth 2 times each day (in the morning and in the evening) for 5 days. For each dose, take all 3 tablets at the same time. o If you have kidney disease, talk to your healthcare provider. You may need a different dose. ? Swallow the tablets whole. Do not chew, break, or crush the tablets. ? Take PAXLOVID with or without food. ? Do not stop taking PAXLOVID without talking to your healthcare provider, even if you feel better. ? If you miss a dose of PAXLOVID within 8 hours of the time it is usually taken, take it as soon as you remember. If you miss a dose by  more than 8 hours, skip the missed dose and take the next dose at your regular time. Do not take 2 doses of PAXLOVID at the same time. ? If you take too much PAXLOVID, call your healthcare provider or go to the nearest hospital emergency room right away. ? If you are taking a ritonavir- or cobicistat-containing medicine to treat hepatitis C or Human Immunodeficiency Virus (HIV), you should continue to take your medicine as prescribed by your healthcare provider. 2 Revised: 08 September 2020    Talk to your healthcare provider if you do not feel better or if you feel worse after 5 days.  Who should generally not take PAXLOVID? Do not take PAXLOVID if: ? You are allergic to nirmatrelvir, ritonavir, or any of the ingredients in PAXLOVID. ? You are taking  any of the following medicines: o Alfuzosin o Pethidine, propoxyphene o Ranolazine o Amiodarone, dronedarone, flecainide, propafenone, quinidine o Colchicine o Lurasidone, pimozide, clozapine o Dihydroergotamine, ergotamine, methylergonovine o Lovastatin, simvastatin o Sildenafil (Revatio) for pulmonary arterial hypertension (PAH) o Triazolam, oral midazolam o Apalutamide o Carbamazepine, phenobarbital, phenytoin o Rifampin o St. Johns Wort (hypericum perforatum) Taking PAXLOVID with these medicines may cause serious or life-threatening side effects or affect how PAXLOVID works.  These are not the only medicines that may cause serious side effects if taken with PAXLOVID. PAXLOVID may increase or decrease the levels of multiple other medicines. It is very important to tell your healthcare provider about all of the medicines you are taking because additional laboratory tests or changes in the dose of your other medicines may be necessary while you are taking PAXLOVID. Your healthcare provider may also tell you about specific symptoms to watch out for that may indicate that you need to stop or decrease the dose of some of your other medicines.  What are the important possible side effects of PAXLOVID? Possible side effects of PAXLOVID are: ? Allergic Reactions. Allergic reactions can happen in people taking PAXLOVID, even after only 1 dose. Stop taking PAXLOVID and call your healthcare provider right away if you get any of the following symptoms of an allergic reaction: o hives o trouble swallowing or breathing o swelling of the mouth, lips, or face o throat tightness o hoarseness 3 Revised: 08 September 2020  o skin rash ? Liver Problems. Tell your healthcare provider right away if you have any of these signs and symptoms of liver problems: loss of appetite, yellowing of your skin and the whites of eyes (jaundice), dark-colored urine, pale colored stools and itchy skin, stomach  area (abdominal) pain. ? Resistance to HIV Medicines. If you have untreated HIV infection, PAXLOVID may lead to some HIV medicines not working as well in the future. ? Other possible side effects include: o altered sense of taste o diarrhea o high blood pressure o muscle aches These are not all the possible side effects of PAXLOVID. Not many people have taken PAXLOVID. Serious and unexpected side effects may happen. PAXLOVID is still being studied, so it is possible that all of the risks are not known at this time.  What other treatment choices are there? Veklury (remdesivir) is FDA-approved for the treatment of mild-to-moderate VELFY-10 in certain adults and children. Talk with your doctor to see if Marijean Heath is appropriate for you. Like PAXLOVID, FDA may also allow for the emergency use of other medicines to treat people with COVID-19. Go to https://Demmer.info/ for information on the emergency use of other medicines that are authorized by FDA to treat  people with COVID-19. Your healthcare provider may talk with you about clinical trials for which you may be eligible. It is your choice to be treated or not to be treated with PAXLOVID. Should you decide not to receive it or for your child not to receive it, it will not change your standard medical care.  What if I am pregnant or breastfeeding? There is no experience treating pregnant women or breastfeeding mothers with PAXLOVID. For a mother and unborn baby, the benefit of taking PAXLOVID may be greater than the risk from the treatment. If you are pregnant, discuss your options and specific situation with your healthcare provider. It is recommended that you use effective barrier contraception or do not have sexual activity while taking PAXLOVID. If you are breastfeeding, discuss your options and specific situation with  your healthcare provider. 4 Revised: 08 September 2020   How do I report side effects with PAXLOVID? Contact your healthcare provider if you have any side effects that bother you or do not go away. Report side effects to FDA MedWatch at SmoothHits.hu or call 1-800-FDA1088 or you can report side effects to [employer]. at the contact information provided below. Website Fax number Telephone number www.pfizersafetyreporting.com ([phone] How should I store Braman? Store PAXLOVID tablets at room temperature, between 68?F to 77?F (20?C to 25?C). How can I learn more about COVID-19? ? Ask your healthcare provider. ? Visit https://jacobson-johnson.com/. ? Contact your local or state public health department. What is an Emergency Use Authorization (EUA)? The [place] FDA has made PAXLOVID available under an emergency access mechanism called an Emergency Use Authorization (EUA). The EUA is supported by a Education officer, museum and Human Service (HHS) declaration that circumstances exist to justify the emergency use of drugs and biological products during the COVID-19 pandemic. PAXLOVID for the treatment of mild-to-moderate COVID-19 in adults and children [47 years of age and older weighing at least 50 pounds (62 kg)] with positive results of direct SARS-CoV-2 viral testing, and who are at high risk for progression to severe COVID-19, including hospitalization or death, has not undergone the same type of review as an FDA-approved product. In issuing an EUA under the QBVQX-45 public health emergency, the FDA has determined, among other things, that based on the total amount of scientific evidence available including data from adequate and well-controlled clinical trials, if available, it is reasonable to believe that the product may be effective for diagnosing, treating, or preventing COVID-19, or a serious or life-threatening disease or condition caused by COVID-19;  that the known and potential benefits of the product, when used to diagnose, treat, or prevent such disease or condition, outweigh the known and potential risks of such product; and that there are no adequate, approved, and available alternatives. All of these criteria must be met to allow for the product to be used in the treatment of patients during the COVID-19 pandemic. The EUA for PAXLOVID is in effect for the duration of the COVID-19 declaration justifying emergency use of this product, unless terminated or revoked (after which the products may no longer be used under the EUA). 5 Revised: 08 September 2020     Additional Information For general questions, visit the website or call the telephone number provided below. Website Telephone number www.COVID19oralRx.com [phone] (1-877-C19-PACK) You can also go to www.pfizermedinfo.com or call [phone] for more information. [id number] Revised: 08 September 2020

## 2021-07-17 NOTE — Progress Notes (Signed)
Virtual Visit via Video Note  I connected with Alexandra Garner  on 07/17/21 at  4:00 PM EST by a video enabled telemedicine application and verified that I am speaking with the correct person using two identifiers.  Location patient: [place] Location provider:work or home office Persons participating in the virtual visit: patient, provider  I discussed the limitations and requested verbal permission for telemedicine visit. The patient expressed understanding and agreed to proceed.   HPI:  Acute telemedicine visit for Covid19: -Onset: 2-3 days ago; Covid test was positive today -Symptoms include: nasal congestion, scratchy throat, ears feel full, body aches, cough -Denies:fever, CP, SOB, NVD -Has tried: Tylenol -Pertinent past medical history: see below; reports she has not had covid before to her knowledge -reports has an immune condition, RA and sees rheumatologist and is on plaquenil -eGFR was 101 in the end of august -Pertinent medication allergies: Allergies  Allergen Reactions   Penicillins Shortness Of Breath and Swelling    Other reaction(s): anaphylaxis/angioedema Mouth swells and it gets hard to breathe    Adhesive [Tape] Other (See Comments)    Other reaction(s): other/intolerance blisters blisters   Latex Hives  -COVID-19 vaccine status:  2 doses, 3 boosters Immunization History  Administered Date(s) Administered   PFIZER(Purple Top)SARS-COV-2 Vaccination 07/20/2019, 08/11/2019, 03/30/2020, 10/30/2020   Tdap 10/01/2011   Zoster Recombinat (Shingrix) 04/15/2019, 06/17/2019     ROS: See pertinent positives and negatives per HPI.  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 Janice Coffin   COLONOSCOPY  03/28/2008   PELVIC LAPAROSCOPY     REDUCTION MAMMAPLASTY       Current Outpatient Medications:    acetaminophen (TYLENOL) 500 MG tablet, Take by mouth as needed. , Disp: , Rfl:    aspirin 81 MG tablet, Take 81 mg by mouth 2 (two) times daily. , Disp: , Rfl:    benzonatate (TESSALON PERLES) 100 MG capsule, 1-2 capsules up to twice daily as needed, Disp: 30 capsule, Rfl: 0   estrogens, conjugated, (PREMARIN) 0.625 MG tablet, TAKE 1 TABLET (0.625 MG TOTAL) BY MOUTH DAILY., Disp: 90 tablet, Rfl: 3   hydroxychloroquine (PLAQUENIL) 200 MG tablet, TAKE 1 TABLET BY MOUTH EVERY DAY, Disp: 90 tablet, Rfl: 0   Multiple Vitamins-Minerals (PRESERVISION AREDS) CAPS, Take 1 tablet by mouth daily. , Disp: , Rfl:    nirmatrelvir/ritonavir EUA (PAXLOVID) 20 x 150 MG & 10 x 100MG TABS, Take 3 tablets by mouth 2 (two) times daily for 5 days. (Take nirmatrelvir 150 mg two tablets twice daily for 5 days and ritonavir 100 mg one tablet twice daily for 5 days) Patient GFR is > 60, Disp: 30 tablet, Rfl: 0   Vitamin D, Cholecalciferol, 25 MCG (1000 UT) TABS, Take 2 tablets by mouth daily. , Disp: , Rfl:    ibuprofen (ADVIL) 400 MG tablet, Take by mouth as needed.  (Patient not taking: Reported on 07/17/2021), Disp: ,  Rfl:   EXAM:  VITALS per patient if applicable:  GENERAL: alert, oriented, appears well and in no acute distress  HEENT: atraumatic, conjunttiva clear, no obvious abnormalities on inspection of external nose and ears  NECK: normal movements of the head and neck  LUNGS: on inspection no signs of respiratory distress, breathing rate appears normal, no obvious gross SOB, gasping or wheezing  CV: no obvious cyanosis  MS: moves all visible extremities without noticeable  abnormality  PSYCH/NEURO: pleasant and cooperative, no obvious depression or anxiety, speech and thought processing grossly intact  ASSESSMENT AND PLAN:  Discussed the following assessment and plan:  COVID-19   Discussed treatment options and risk of drug interactions, ideal treatment window, potential complications, isolation and precautions for COVID-19.  Discussed possibility of rebound with antivirals and the need to reisolate if it should occur for 5 days. Checked for/reviewed any labs done in the last 90 days with GFR listed in HPI if available. After lengthy discussion, the patient opted for treatment with Paxlovid due to being higher risk for complications of covid or severe disease and other factors. Discussed EUA status of this drug and the fact that there is preliminary limited knowledge of risks/interactions/side effects per EUA document vs possible benefits and precautions. This information was shared with patient during the visit and also was provided in patient instructions. Also, advised that patient discuss risks/interactions and use with pharmacist/treatment team as well. She plans to contact her specialist/rheumatologist to let them know about her infection and treatment.The patient did want a prescription for cough, Tessalon Rx sent.  Other symptomatic care measures summarized in patient instructions. Advised to seek prompt virtual visit or in person care if worsening, new symptoms arise, or if is not improving with treatment as expected per our conversation of expected course. Discussed options for follow up care. Did let this patient know that I do telemedicine on Tuesdays and Thursdays for [facility] and those are the days I am logged into the system. Advised to schedule follow up visit with PCP, [place] virtual visits or [facility] if any further questions or concerns to avoid delays in care.   I discussed the assessment and treatment plan with the patient. The patient was provided an  opportunity to ask questions and all were answered. The patient agreed with the plan and demonstrated an understanding of the instructions.     Alexandra Kern, DO

## 2021-07-23 ENCOUNTER — Ambulatory Visit: Payer: 59 | Admitting: Rheumatology

## 2021-07-23 DIAGNOSIS — I7300 Raynaud's syndrome without gangrene: Secondary | ICD-10-CM

## 2021-07-23 DIAGNOSIS — M19041 Primary osteoarthritis, right hand: Secondary | ICD-10-CM

## 2021-07-23 DIAGNOSIS — M7061 Trochanteric bursitis, right hip: Secondary | ICD-10-CM

## 2021-07-23 DIAGNOSIS — D219 Benign neoplasm of connective and other soft tissue, unspecified: Secondary | ICD-10-CM

## 2021-07-23 DIAGNOSIS — Z79899 Other long term (current) drug therapy: Secondary | ICD-10-CM

## 2021-07-23 DIAGNOSIS — H9313 Tinnitus, bilateral: Secondary | ICD-10-CM

## 2021-07-23 DIAGNOSIS — M5030 Other cervical disc degeneration, unspecified cervical region: Secondary | ICD-10-CM

## 2021-07-23 DIAGNOSIS — M170 Bilateral primary osteoarthritis of knee: Secondary | ICD-10-CM

## 2021-07-23 DIAGNOSIS — M19071 Primary osteoarthritis, right ankle and foot: Secondary | ICD-10-CM

## 2021-07-23 DIAGNOSIS — R5383 Other fatigue: Secondary | ICD-10-CM

## 2021-07-23 DIAGNOSIS — E559 Vitamin D deficiency, unspecified: Secondary | ICD-10-CM

## 2021-07-23 DIAGNOSIS — Z8669 Personal history of other diseases of the nervous system and sense organs: Secondary | ICD-10-CM

## 2021-07-23 DIAGNOSIS — M8589 Other specified disorders of bone density and structure, multiple sites: Secondary | ICD-10-CM

## 2021-07-23 DIAGNOSIS — M5136 Other intervertebral disc degeneration, lumbar region: Secondary | ICD-10-CM

## 2021-07-23 DIAGNOSIS — R609 Edema, unspecified: Secondary | ICD-10-CM

## 2021-07-23 DIAGNOSIS — M359 Systemic involvement of connective tissue, unspecified: Secondary | ICD-10-CM

## 2021-07-23 DIAGNOSIS — R760 Raised antibody titer: Secondary | ICD-10-CM

## 2021-07-23 DIAGNOSIS — Z86010 Personal history of colonic polyps: Secondary | ICD-10-CM

## 2021-07-24 ENCOUNTER — Telehealth: Payer: Self-pay

## 2021-07-24 NOTE — Telephone Encounter (Signed)
VOB submitted for Durolane, left knee. °BV pending. °

## 2021-07-25 ENCOUNTER — Telehealth: Payer: Self-pay

## 2021-07-25 NOTE — Telephone Encounter (Signed)
Called and left a VM for patient to CB to schedule for gel injection with Dr. Ninfa Linden.  Approved for Durolane, left knee. Buy & Bill Must meet deductible first Patient will be responsible for 20% OOP. Co-pay of $50.00 No PA required

## 2021-07-26 NOTE — Progress Notes (Deleted)
Office Visit Note  Patient: Alexandra Garner             Date of Birth: [birth year]           MRN: [id number]             PCP: Leamon Arnt, MD Referring: Leamon Arnt, MD Visit Date: 08/08/2021 Occupation: @GUAROCC @  Subjective:  No chief complaint on file.   History of Present Illness: Alexandra Garner is a 61 y.o. female ***   Activities of Daily Living:  Patient reports morning stiffness for *** {minute/hour:19697}.   Patient {ACTIONS;DENIES/REPORTS:21021675::"Denies"} nocturnal pain.  Difficulty dressing/grooming: {ACTIONS;DENIES/REPORTS:21021675::"Denies"} Difficulty climbing stairs: {ACTIONS;DENIES/REPORTS:21021675::"Denies"} Difficulty getting out of chair: {ACTIONS;DENIES/REPORTS:21021675::"Denies"} Difficulty using hands for taps, buttons, cutlery, and/or writing: {ACTIONS;DENIES/REPORTS:21021675::"Denies"}  No Rheumatology ROS completed.   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   Family history of macular degeneration 02/03/2015   Primary localized osteoarthrosis of hand 12/23/2013   History of colonic polyps 11/01/2008    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 Janice Coffin   COLONOSCOPY  03/28/2008   PELVIC LAPAROSCOPY     REDUCTION MAMMAPLASTY     Social History   Social History Narrative   Not on file   Immunization History  Administered Date(s) Administered   PFIZER(Purple Top)SARS-COV-2 Vaccination 07/20/2019, 08/11/2019, 03/30/2020, 10/30/2020   Tdap 10/01/2011   Zoster Recombinat (Shingrix) 04/15/2019, 06/17/2019     Objective: Vital Signs: LMP 10/11/2012    Physical Exam   Musculoskeletal Exam: ***  CDAI Exam: CDAI Score: -- Patient Global: --; Provider Global: -- Swollen: --; Tender: -- Joint Exam 08/08/2021   No joint exam has been documented for this visit   There is currently no information documented on the homunculus. Go to the Rheumatology activity and complete the homunculus joint exam.  Investigation: No additional findings.  Imaging: No results found.  Recent Labs: Lab Results  Component Value Date   WBC 5.1 02/20/2021   HGB 13.0 02/20/2021   PLT 271 02/20/2021   NA 139 02/20/2021   K 4.5 02/20/2021   CL 105 02/20/2021   CO2 28 02/20/2021   GLUCOSE 85 02/20/2021   BUN 10 02/20/2021   CREATININE 0.68 02/20/2021   BILITOT 0.5 02/20/2021  ALKPHOS 33 (L) 01/10/2020   AST 17 02/20/2021   ALT 13 02/20/2021   PROT 6.6 02/20/2021   ALBUMIN 3.9 01/10/2020   CALCIUM 9.5 02/20/2021   GFRAA 99 08/29/2020    Speciality Comments: Plaquenil eye exam: normal on 12/28/2020 WNL [place] Follow up in 1 year.   Procedures:  No procedures performed Allergies: Penicillins, Adhesive [tape], and Latex   Assessment / Plan:     Visit Diagnoses: No diagnosis  found.  Orders: No orders of the defined types were placed in this encounter.  No orders of the defined types were placed in this encounter.   Face-to-face time spent with patient was *** minutes. Greater than 50% of time was spent in counseling and coordination of care.  Follow-Up Instructions: No follow-ups on file.   Earnestine Mealing, CMA  Note - This record has been created using Editor, commissioning.  Chart creation errors have been sought, but may not always  have been located. Such creation errors do not reflect on  the standard of medical care.

## 2021-08-08 ENCOUNTER — Ambulatory Visit: Payer: 59 | Admitting: Rheumatology

## 2021-08-08 DIAGNOSIS — M5030 Other cervical disc degeneration, unspecified cervical region: Secondary | ICD-10-CM

## 2021-08-08 DIAGNOSIS — M359 Systemic involvement of connective tissue, unspecified: Secondary | ICD-10-CM

## 2021-08-08 DIAGNOSIS — D219 Benign neoplasm of connective and other soft tissue, unspecified: Secondary | ICD-10-CM

## 2021-08-08 DIAGNOSIS — M5136 Other intervertebral disc degeneration, lumbar region: Secondary | ICD-10-CM

## 2021-08-08 DIAGNOSIS — M19071 Primary osteoarthritis, right ankle and foot: Secondary | ICD-10-CM

## 2021-08-08 DIAGNOSIS — Z79899 Other long term (current) drug therapy: Secondary | ICD-10-CM

## 2021-08-08 DIAGNOSIS — M7061 Trochanteric bursitis, right hip: Secondary | ICD-10-CM

## 2021-08-08 DIAGNOSIS — R760 Raised antibody titer: Secondary | ICD-10-CM

## 2021-08-08 DIAGNOSIS — I7300 Raynaud's syndrome without gangrene: Secondary | ICD-10-CM

## 2021-08-08 DIAGNOSIS — Z86010 Personal history of colonic polyps: Secondary | ICD-10-CM

## 2021-08-08 DIAGNOSIS — M19042 Primary osteoarthritis, left hand: Secondary | ICD-10-CM

## 2021-08-08 DIAGNOSIS — M170 Bilateral primary osteoarthritis of knee: Secondary | ICD-10-CM

## 2021-08-08 DIAGNOSIS — R5383 Other fatigue: Secondary | ICD-10-CM

## 2021-08-08 DIAGNOSIS — H9313 Tinnitus, bilateral: Secondary | ICD-10-CM

## 2021-08-08 DIAGNOSIS — E559 Vitamin D deficiency, unspecified: Secondary | ICD-10-CM

## 2021-08-08 DIAGNOSIS — Z8669 Personal history of other diseases of the nervous system and sense organs: Secondary | ICD-10-CM

## 2021-08-08 DIAGNOSIS — R609 Edema, unspecified: Secondary | ICD-10-CM

## 2021-08-08 DIAGNOSIS — M8589 Other specified disorders of bone density and structure, multiple sites: Secondary | ICD-10-CM

## 2021-09-08 ENCOUNTER — Other Ambulatory Visit: Payer: Self-pay | Admitting: Physician Assistant

## 2021-09-08 DIAGNOSIS — M359 Systemic involvement of connective tissue, unspecified: Secondary | ICD-10-CM

## 2021-09-11 NOTE — Progress Notes (Signed)
Office Visit Note  Patient: Alexandra Garner             Date of Birth: 11/05/1960           MRN: [id number]             PCP: Willow Ora, MD Referring: Willow Ora, MD Visit Date: 09/13/2021 Occupation: @GUAROCC @  Subjective:  Medication management  History of Present Illness: Alexandra Garner is a 61 y.o. female with history of autoimmune disease, osteoarthritis and degenerative disc disease.  She continues to have some discomfort in the right toe.  She has intermittent discomfort in the trochanteric bursa.  [name] symptoms are not very active.  She had a recent episode of nose sore which is resolved now.  She denies any history of sicca symptoms, photosensitivity.  She has not noticed any significant joint swelling.  Activities of Daily Living:  Patient reports morning stiffness for a few minutes.   Patient Reports nocturnal pain.  Difficulty dressing/grooming: Denies Difficulty climbing stairs: Denies Difficulty getting out of chair: Denies Difficulty using hands for taps, buttons, cutlery, and/or writing: Reports  Review of Systems  Constitutional:  Positive for fatigue.  HENT:  Positive for nose dryness. Negative for mouth sores and mouth dryness.        Nose sore  Eyes:  Negative for pain, itching and dryness.  Respiratory:  Negative for shortness of breath and difficulty breathing.   Cardiovascular:  Negative for chest pain and palpitations.  Gastrointestinal:  Negative for blood in stool, constipation and diarrhea.  Endocrine: Negative for increased urination.  Genitourinary:  Negative for difficulty urinating.  Musculoskeletal:  Positive for joint pain, joint pain, joint swelling and morning stiffness. Negative for myalgias, muscle tenderness and myalgias.  Skin:  Negative for color change, rash and redness.  Allergic/Immunologic: Negative for susceptible to infections.  Neurological:  Positive for numbness. Negative for dizziness, headaches, memory loss and  weakness.  Hematological:  Positive for bruising/bleeding tendency.  Psychiatric/Behavioral:  Negative for confusion.    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   Family history of macular degeneration 02/03/2015   Primary localized osteoarthrosis of hand 12/23/2013   History of colonic polyps 11/01/2008    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 Toy Baker   COLONOSCOPY  03/28/2008   PELVIC LAPAROSCOPY     REDUCTION MAMMAPLASTY     Social History   Social History Narrative   Not on file   Immunization History  Administered Date(s) Administered   PFIZER(Purple Top)SARS-COV-2 Vaccination 07/20/2019, 08/11/2019, 03/30/2020, 10/30/2020   Tdap 10/01/2011   Zoster Recombinat (Shingrix) 04/15/2019, 06/17/2019     Objective: Vital Signs: BP 138/85 (BP Location:  Left Arm, Patient Position: Sitting, Cuff Size: Normal)   Pulse 60   Ht 5\' 6"  (1.676 m)   Wt 142 lb 6.4 oz (64.6 kg)   LMP 10/11/2012   BMI 22.98 kg/m    Physical Exam Vitals and nursing note reviewed.  Constitutional:      Appearance: She is well-developed.  HENT:     Head: Normocephalic and atraumatic.  Eyes:     Conjunctiva/sclera: Conjunctivae normal.  Cardiovascular:     Rate and Rhythm: Normal rate and regular rhythm.     Heart sounds: Normal heart sounds.  Pulmonary:     Effort: Pulmonary effort is normal.     Breath sounds: Normal breath sounds.  Abdominal:     General: Bowel sounds are normal.     Palpations: Abdomen is soft.  Musculoskeletal:     Cervical back: Normal range of motion.  Lymphadenopathy:     Cervical: No cervical adenopathy.  Skin:    General: Skin is warm and dry.     Capillary Refill: Capillary refill takes less than 2 seconds.  Neurological:     Mental Status: She is alert and oriented to person, place, and time.  Psychiatric:        Behavior: Behavior normal.     Musculoskeletal Exam: C-spine was in good range of motion.  Shoulder joints, elbow joints, wrist joints with good range of motion.  She had bilateral PIP and DIP thickening in her hands.  Hip joints and knee joints in good range of motion.  She had mild tenderness on palpation of right trochanteric bursa.  She had thickening of the right first MTP.  Mild edema was noted on her left lower extremity.  CDAI Exam: CDAI Score: -- Patient Global: --; Provider Global: -- Swollen: --; Tender: -- Joint Exam 09/13/2021   No joint exam has been documented for this visit   There is currently no information documented on the homunculus. Go to the Rheumatology activity and complete the homunculus joint exam.  Investigation: No additional findings.  Imaging: No results found.  Recent Labs: Lab Results  Component Value Date   WBC 5.1 02/20/2021   HGB 13.0 02/20/2021   PLT 271  02/20/2021   NA 139 02/20/2021   K 4.5 02/20/2021   CL 105 02/20/2021   CO2 28 02/20/2021   GLUCOSE 85 02/20/2021   BUN 10 02/20/2021   CREATININE 0.68 02/20/2021   BILITOT 0.5 02/20/2021   ALKPHOS 33 (L) 01/10/2020   AST 17 02/20/2021   ALT 13 02/20/2021   PROT 6.6 02/20/2021   ALBUMIN 3.9 01/10/2020   CALCIUM 9.5 02/20/2021   GFRAA 99 08/29/2020    Speciality Comments: Plaquenil eye exam: normal on 12/28/2020 WNL [facility] Follow up in 1 year.   Procedures:  No procedures performed Allergies: Penicillins, Adhesive [tape], and Latex   Assessment / Plan:     Visit Diagnoses: Autoimmune disease ([facility]) - History of arthralgias, [name], photosensitivity, positive aCL IgM46, positive beta 2 IgM 100: -She gives history of nasal ulcers, Raynaud's phenomenon and  arthralgias.  No synovitis was noted.  I will obtain labs today.  She has noticed improvement in her joint symptoms since she has been taking hydroxychloroquine 200 mg p.o. daily.  Plan: Protein / creatinine ratio, urine, Anti-DNA antibody, double-stranded, C3 and C4, Sedimentation rate, Beta-2 glycoprotein antibodies, Cardiolipin antibodies, IgG, IgM, IgA, Lupus Anticoagulant Eval w/Reflex today.  We will contact her with the lab results.  High risk medication use - Plaquenil 200 mg 1 tablet by mouth daily.  Plaquenil eye exam: normal on 12/28/2020 - Plan: CBC with Differential/Platelet, COMPLETE METABOLIC PANEL WITH GFR today.  Raynaud's disease without gangrene-currently not active.  Antiphospholipid antibody positive - History of beta-2 glycoprotein antibodies positive, anticardiolipin antibody positive, and lupus anticoagulant positive. No history of blood clots.  She has been taking aspirin 81 mg every other day.  Primary osteoarthritis of both hands-joint protection muscle strengthening was discussed.  Primary osteoarthritis of both feet-she has been having discomfort in her right first MTP joint.  No warmth  swelling or effusion was noted.  She has osteoarthritic changes.  She also has callus formation on the medial aspect of her left first MTP.  Proper fitting shoes with padded socks were emphasized.  Primary osteoarthritis of both knees-she has intermittent discomfort in her knee joints.  No warmth swelling or effusion was noted.  Trochanteric bursitis, right hip-she had tenderness on palpation over right trochanteric area.  DDD (degenerative disc disease), cervical - MRI cervical spine multilevel DDD and spondylosis with moderate neuroforaminal narrowing.  DDD (degenerative disc disease), lumbar - MRI lumbar spine on 05/12/19-L5-S1 disc bulge and mild to moderate neural foraminal narrowing.  She has intermittent discomfort with certain activities.  Osteopenia of multiple sites - DEXA 07/25/2020 The BMD measured at [facility] L1-L2 is 0.962 g/cm2 with a T-score of -1.7.  She has been walking on a regular basis.  She also takes calcium and vitamin D.  Vitamin D deficiency  Other fatigue  Elevated blood pressure reading-I advised her to monitor blood pressure at home and keep a record.  She may discuss the blood pressure readings with Dr. Mardelle Matte at the follow-up visit.  Pitting edema-left lower extremity has mild pitting edema.  Compression socks were discussed.  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 Follow-up scheduled with her orthopedist on 09/19/2019 to discuss repeat MRI of the left hip as recommended on a yearly.   History of colonic polyps  History of migraine  Tinnitus, bilateral  Orders: Orders Placed This Encounter  Procedures   Protein / creatinine ratio, urine   CBC with Differential/Platelet   COMPLETE METABOLIC PANEL WITH GFR   Anti-DNA antibody, double-stranded   C3 and C4   Sedimentation rate   Beta-2 glycoprotein antibodies   Cardiolipin antibodies, IgG, IgM, IgA   Lupus Anticoagulant Eval w/Reflex   No orders of the defined types were placed in this  encounter.    Follow-Up Instructions: Return in about 5 months (around 02/13/2022) for Autoimmune disease.   Pollyann Savoy, MD  Note - This record has been created using Animal nutritionist.  Chart creation errors have been sought, but may not always  have been located. Such creation errors do not reflect on  the standard of medical care.

## 2021-09-13 ENCOUNTER — Other Ambulatory Visit: Payer: Self-pay

## 2021-09-13 ENCOUNTER — Encounter: Payer: Self-pay | Admitting: Rheumatology

## 2021-09-13 ENCOUNTER — Ambulatory Visit: Payer: 59 | Admitting: Rheumatology

## 2021-09-13 VITALS — BP 138/85 | HR 60 | Ht 66.0 in | Wt 142.4 lb

## 2021-09-13 DIAGNOSIS — Z79899 Other long term (current) drug therapy: Secondary | ICD-10-CM | POA: Diagnosis not present

## 2021-09-13 DIAGNOSIS — M170 Bilateral primary osteoarthritis of knee: Secondary | ICD-10-CM

## 2021-09-13 DIAGNOSIS — R760 Raised antibody titer: Secondary | ICD-10-CM

## 2021-09-13 DIAGNOSIS — M19041 Primary osteoarthritis, right hand: Secondary | ICD-10-CM

## 2021-09-13 DIAGNOSIS — M51369 Other intervertebral disc degeneration, lumbar region without mention of lumbar back pain or lower extremity pain: Secondary | ICD-10-CM

## 2021-09-13 DIAGNOSIS — M359 Systemic involvement of connective tissue, unspecified: Secondary | ICD-10-CM | POA: Diagnosis not present

## 2021-09-13 DIAGNOSIS — M8589 Other specified disorders of bone density and structure, multiple sites: Secondary | ICD-10-CM

## 2021-09-13 DIAGNOSIS — R030 Elevated blood-pressure reading, without diagnosis of hypertension: Secondary | ICD-10-CM

## 2021-09-13 DIAGNOSIS — Z8669 Personal history of other diseases of the nervous system and sense organs: Secondary | ICD-10-CM

## 2021-09-13 DIAGNOSIS — M5136 Other intervertebral disc degeneration, lumbar region: Secondary | ICD-10-CM

## 2021-09-13 DIAGNOSIS — D219 Benign neoplasm of connective and other soft tissue, unspecified: Secondary | ICD-10-CM

## 2021-09-13 DIAGNOSIS — E559 Vitamin D deficiency, unspecified: Secondary | ICD-10-CM

## 2021-09-13 DIAGNOSIS — I7300 Raynaud's syndrome without gangrene: Secondary | ICD-10-CM

## 2021-09-13 DIAGNOSIS — M19071 Primary osteoarthritis, right ankle and foot: Secondary | ICD-10-CM

## 2021-09-13 DIAGNOSIS — M19072 Primary osteoarthritis, left ankle and foot: Secondary | ICD-10-CM

## 2021-09-13 DIAGNOSIS — R609 Edema, unspecified: Secondary | ICD-10-CM

## 2021-09-13 DIAGNOSIS — G8929 Other chronic pain: Secondary | ICD-10-CM

## 2021-09-13 DIAGNOSIS — M7061 Trochanteric bursitis, right hip: Secondary | ICD-10-CM

## 2021-09-13 DIAGNOSIS — H9313 Tinnitus, bilateral: Secondary | ICD-10-CM

## 2021-09-13 DIAGNOSIS — Z860100 Personal history of colon polyps, unspecified: Secondary | ICD-10-CM

## 2021-09-13 DIAGNOSIS — M19042 Primary osteoarthritis, left hand: Secondary | ICD-10-CM

## 2021-09-13 DIAGNOSIS — M5030 Other cervical disc degeneration, unspecified cervical region: Secondary | ICD-10-CM

## 2021-09-13 DIAGNOSIS — R5383 Other fatigue: Secondary | ICD-10-CM

## 2021-09-17 ENCOUNTER — Other Ambulatory Visit: Payer: Self-pay | Admitting: *Deleted

## 2021-09-17 DIAGNOSIS — M359 Systemic involvement of connective tissue, unspecified: Secondary | ICD-10-CM

## 2021-09-17 NOTE — Telephone Encounter (Signed)
-----  Message from Ofilia Neas, PA-C sent at 09/17/2021 11:35 AM EDT ----- ?CBC and CMP WNL.  ESR WNL.  Complements WNL.  Protein creatinine ratio WNL. dsDNA is negative.  ?

## 2021-09-17 NOTE — Telephone Encounter (Signed)
Next Visit: 02/15/2022 ? ?Last Visit: 09/13/2021 ? ?Labs: 09/13/2021 CBC and CMP WNL. ? ?Eye exam: 12/28/2020 WNL   ? ?Current Dose per office note 09/13/2021: hydroxychloroquine 200 mg p.o. daily. ? ?[id number] disease  ? ?Last Fill: 06/11/2021 ? ?Okay to refill Plaquenil?  ?

## 2021-09-18 MED ORDER — HYDROXYCHLOROQUINE SULFATE 200 MG PO TABS: 200.0000 mg | ORAL_TABLET | Freq: Every day | ORAL | 0 refills | Status: DC

## 2021-09-19 LAB — COMPLETE METABOLIC PANEL WITH GFR
AG Ratio: 1.8 (calc) (ref 1.0–2.5)
ALT: 13 U/L (ref 6–29)
AST: 19 U/L (ref 10–35)
Albumin: 4.2 g/dL (ref 3.6–5.1)
Alkaline phosphatase (APISO): 42 U/L (ref 37–153)
BUN: 10 mg/dL (ref 7–25)
CO2: 28 mmol/L (ref 20–32)
Calcium: 9.2 mg/dL (ref 8.6–10.4)
Chloride: 104 mmol/L (ref 98–110)
Creat: 0.82 mg/dL (ref 0.50–1.03)
Globulin: 2.3 g/dL (calc) (ref 1.9–3.7)
Glucose, Bld: 86 mg/dL (ref 65–99)
Potassium: 4.1 mmol/L (ref 3.5–5.3)
Sodium: 140 mmol/L (ref 135–146)
Total Bilirubin: 0.4 mg/dL (ref 0.2–1.2)
Total Protein: 6.5 g/dL (ref 6.1–8.1)
eGFR: 82 mL/min/{1.73_m2} (ref >=60)

## 2021-09-19 LAB — C3 AND C4
C3 Complement: 116 mg/dL (ref 83–193)
C4 Complement: 18 mg/dL (ref 15–57)

## 2021-09-19 LAB — BETA-2 GLYCOPROTEIN ANTIBODIES
Beta-2 Glyco 1 IgA: <2 U/mL (ref <20.0)
Beta-2 Glyco 1 IgM: 29.8 U/mL — ABNORMAL HIGH (ref <20.0)
Beta-2 Glyco I IgG: 2.6 U/mL (ref <20.0)

## 2021-09-19 LAB — CBC WITH DIFFERENTIAL/PLATELET
Absolute Monocytes: 354 cells/uL (ref 200–950)
Basophils Absolute: 29 cells/uL (ref 0–200)
Basophils Relative: 0.5 %
Eosinophils Absolute: 93 cells/uL (ref 15–500)
Eosinophils Relative: 1.6 %
HCT: 36.8 % (ref 35.0–45.0)
Hemoglobin: 12.3 g/dL (ref 11.7–15.5)
Lymphs Abs: 1328 cells/uL (ref 850–3900)
MCH: 28.5 pg (ref 27.0–33.0)
MCHC: 33.4 g/dL (ref 32.0–36.0)
MCV: 85.4 fL (ref 80.0–100.0)
MPV: 11 fL (ref 7.5–12.5)
Monocytes Relative: 6.1 %
Neutro Abs: 3996 cells/uL (ref 1500–7800)
Neutrophils Relative %: 68.9 %
Platelets: 257 10*3/uL (ref 140–400)
RBC: 4.31 10*6/uL (ref 3.80–5.10)
RDW: 12.9 % (ref 11.0–15.0)
Total Lymphocyte: 22.9 %
WBC: 5.8 10*3/uL (ref 3.8–10.8)

## 2021-09-19 LAB — LUPUS ANTICOAGULANT EVAL W/ REFLEX
PTT-LA Screen: 39 s (ref <=40)
dRVVT: 49 s — ABNORMAL HIGH (ref <=45)

## 2021-09-19 LAB — SEDIMENTATION RATE: Sed Rate: 14 mm/h (ref 0–30)

## 2021-09-19 LAB — RFLX DRVVT CONFRIM: DRVVT CONFIRM: POSITIVE — AB

## 2021-09-19 LAB — PROTEIN / CREATININE RATIO, URINE
Creatinine, Urine: 197 mg/dL (ref 20–275)
Protein/Creat Ratio: 76 mg/g creat (ref 24–184)
Protein/Creatinine Ratio: 0.076 mg/mg creat (ref 0.024–0.184)
Total Protein, Urine: 15 mg/dL (ref 5–24)

## 2021-09-19 LAB — CARDIOLIPIN ANTIBODIES, IGG, IGM, IGA
Anticardiolipin IgA: <2 APL-U/mL (ref <20.0)
Anticardiolipin IgG: 2.2 GPL-U/mL (ref <20.0)
Anticardiolipin IgM: 20.1 MPL-U/mL — ABNORMAL HIGH (ref <20.0)

## 2021-09-19 LAB — RFX DRVVT 1:1 MIX: PT, Mixing Interp: POSITIVE — AB

## 2021-09-19 LAB — ANTI-DNA ANTIBODY, DOUBLE-STRANDED: ds DNA Ab: 1 IU/mL

## 2021-09-19 NOTE — Progress Notes (Signed)
Lupus anticoagulant is positive, beta 2 GP 1 IgM low titer positive, anticardiolipin IgM low titer positive.  No change in treatment advised.

## 2021-12-02 ENCOUNTER — Other Ambulatory Visit: Payer: Self-pay | Admitting: Physician Assistant

## 2021-12-02 DIAGNOSIS — M359 Systemic involvement of connective tissue, unspecified: Secondary | ICD-10-CM

## 2021-12-02 NOTE — Telephone Encounter (Signed)
Next Visit: 02/15/2022  Last Visit: 09/13/2021  Labs: 09/13/2021 CBC and CMP WNL.  ESR WNL.  Complements WNL.  Protein creatinine ratio WNL. dsDNA is negative, Lupus anticoagulant is positive, beta 2 GP 1 IgM low titer positive, anticardiolipin IgM low titer positive.  No change in treatment advised.  Eye exam: 12/28/2021   Current Dose per office note 09/13/2021:  Plaquenil 200 mg 1 tablet by mouth daily  XF:[id number] disease  Last Fill: 09/18/2021  Okay to refill Plaquenil?

## 2022-02-06 NOTE — Progress Notes (Unsigned)
Office Visit Note  Patient: Alexandra Garner             Date of Birth: 1961-01-30           MRN: [id number]             PCP: Leamon Arnt, MD Referring: Leamon Arnt, MD Visit Date: 02/20/2022 Occupation: _0 @  Subjective:  Medication monitoring  History of Present Illness: Alexandra Garner is a 61 y.o. female with history of autoimmune disease and osteoarthritis.  Patient is currently taking Plaquenil 200 mg 1 tablet by mouth daily.  She is tolerating Plaquenil without any side effects and has not missed any doses recently.  She had an updated Plaquenil eye examination on 01/01/2022 which was normal.  She denies any signs or symptoms of an autoimmune disease flare.  She remains on aspirin 81 mg 2 tablets daily.  She remains active walking 4 miles on a daily basis for exercise.  Patient reports that if she increases her mileage she experiences increased pain and stiffness in both knees and both hips.  She denies any joint swelling.  She has not had any recent rashes, Raynaud's phenomenon, alopecia, oral or nasal ulcerations, or sicca symptoms.  Her energy level has been stable.  Overall she has been feeling much better on the current dose of Plaquenil.    Activities of Daily Living:  Patient reports morning stiffness for 15-20 minutes.   Patient Denies nocturnal pain.  Difficulty dressing/grooming: Denies Difficulty climbing stairs: Denies Difficulty getting out of chair: Denies Difficulty using hands for taps, buttons, cutlery, and/or writing: Denies  Review of Systems  Constitutional:  Positive for fatigue.  HENT:  Negative for mouth sores and mouth dryness.   Eyes:  Negative for dryness.  Respiratory:  Negative for shortness of breath.   Cardiovascular:  Positive for palpitations. Negative for chest pain.  Gastrointestinal:  Negative for blood in stool, constipation and diarrhea.  Endocrine: Negative for increased urination.  Genitourinary:  Negative for involuntary  urination.  Musculoskeletal:  Positive for joint pain, joint pain, joint swelling, myalgias, morning stiffness, muscle tenderness and myalgias. Negative for gait problem and muscle weakness.  Skin:  Positive for sensitivity to sunlight. Negative for color change, rash and hair loss.  Allergic/Immunologic: Negative for susceptible to infections.  Neurological:  Negative for dizziness and headaches.  Hematological:  Negative for swollen glands.  Psychiatric/Behavioral:  Negative for depressed mood and sleep disturbance. The patient is not nervous/anxious.     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   Family history of macular degeneration 02/03/2015   Primary localized osteoarthrosis of hand 12/23/2013   History of colonic polyps 11/01/2008    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 Janice Coffin   COLONOSCOPY  03/28/2008   PELVIC LAPAROSCOPY     REDUCTION MAMMAPLASTY     Social History   Social History Narrative   Not on file   Immunization History  Administered Date(s) Administered   PFIZER(Purple Top)SARS-COV-2 Vaccination 07/20/2019, 08/11/2019, 03/30/2020, 10/30/2020   Tdap 10/01/2011   Zoster Recombinat (Shingrix) 04/15/2019, 06/17/2019     Objective: Vital Signs: BP 121/82 (BP Location: Left Arm, Patient Position: Sitting, Cuff Size: Normal)   Pulse (!) 59   Resp 16   Ht _0  (1.676 m)   Wt 139 lb 3.2 oz (63.1 kg)   LMP 06/25/2003   BMI 22.47 kg/m    Physical Exam Vitals and nursing note reviewed.  Constitutional:      Appearance: She is well-developed.  HENT:     Head: Normocephalic and atraumatic.  Eyes:     Conjunctiva/sclera: Conjunctivae normal.  Cardiovascular:     Rate and Rhythm: Normal rate and regular rhythm.     Heart sounds: Normal heart sounds.  Pulmonary:     Effort: Pulmonary effort is normal.     Breath sounds: Normal breath sounds.  Abdominal:     General: Bowel sounds are normal.     Palpations: Abdomen is soft.  Musculoskeletal:     Cervical back: Normal range of motion.  Skin:    General: Skin is warm and dry.     Capillary Refill: Capillary refill takes less than 2 seconds.  Neurological:     Mental Status: She is alert and oriented to person, place, and time.  Psychiatric:        Behavior: Behavior normal.      Musculoskeletal Exam: C-spine, thoracic spine, lumbar spine have good range of motion.  No midline spinal tenderness or SI joint tenderness.  Shoulder joints, elbow joints, wrist joints, MCPs, PIPs, DIPs have good range of motion with no synovitis.  Complete fist formation bilaterally.  Mild PIP and DIP thickening consistent with osteoarthritis of both hands.  Hip joints have good range of motion with no groin pain.  Some tenderness over bilateral trochanteric bursa.  Knee joints have  good range of motion with no warmth or effusion.  Ankle joints have good range of motion with no joint tenderness or synovitis.  Very mild pedal edema noted.  No tenderness or synovitis over MTP joints.  No evidence of Achilles tendinitis or planter fasciitis.  PIP and DIP thickening consistent with osteoarthritis of both feet.  CDAI Exam: CDAI Score: -- Patient Global: --; Provider Global: -- Swollen: --; Tender: -- Joint Exam 02/20/2022   No joint exam has been documented for this visit   There is currently no information documented on the homunculus. Go to the Rheumatology activity and complete the homunculus joint exam.  Investigation: No additional findings.  Imaging: No results found.  Recent Labs: Lab Results  Component Value Date   WBC 5.8 09/13/2021   HGB 12.3 09/13/2021   PLT 257 09/13/2021   NA 140 09/13/2021  K 4.1 09/13/2021   CL 104 09/13/2021   CO2 28 09/13/2021   GLUCOSE 86 09/13/2021   BUN 10 09/13/2021   CREATININE 0.82 09/13/2021   BILITOT 0.4 09/13/2021   ALKPHOS 33 (L) 01/10/2020   AST 19 09/13/2021   ALT 13 09/13/2021   PROT 6.5 09/13/2021   ALBUMIN 3.9 01/10/2020   CALCIUM 9.2 09/13/2021   GFRAA 99 08/29/2020    Speciality Comments: Plaquenil eye exam: normal on 01/01/2022 WNL [place] Follow up in 1 year.   Procedures:  No procedures performed Allergies: Penicillins, Adhesive [tape], and Latex    Assessment / Plan:     Visit Diagnoses: Autoimmune disease ([place]) - History of arthralgias, [name], photosensitivity, positive aCL IgM46, positive beta 2 IgM 100: She is not experiencing any signs or symptoms of a flare.  She has clinically been doing well taking Plaquenil 200 mg 1 tablet by mouth daily and aspirin 81 mg 2 tablets daily.  No history of VTE.  Patient remains triple positive reading material for antiphospholipid syndrome.  Evaluated by Dr. Alvy Bimler on 03/12/21-recommended continued use of preventative aspirin and  Plaquenil requiring no routine follow-up. She has been walking 4 miles on a daily basis.  Her energy level has improved.  She had no synovitis on examination today.  She has not had any recent rashes, Raynaud's phenomenon, alopecia, oral or nasal ulcerations, cervical lymphadenopathy or sicca symptoms.  Her lungs are clear to auscultation and she is not experiencing any new or worsening pulmonary symptoms. Lab work from 09/13/2021 was reviewed today in the office: Beta-2 glycoprotein IgM 29.8, anticardiolipin IgM 20.0, lupus anticoagulant detected, ESR within normal limits, complements within normal limits, and double-stranded DNA negative.  The following lab work will be obtained today for further evaluation.  She will remain on the current dose of Plaquenil and aspirin.  She was advised to notify us if she develops signs or symptoms of a flare.  She will follow-up in the office in 5 months or sooner if needed.   - Plan: CBC with Differential/Platelet, COMPLETE METABOLIC PANEL WITH GFR, Protein / creatinine ratio, urine, ANA, Anti-DNA antibody, double-stranded, C3 and C4, Sedimentation rate, Beta-2 glycoprotein antibodies, Cardiolipin antibodies, IgG, IgM, IgA, Lupus Anticoagulant Eval w/Reflex  High risk medication use - Plaquenil 200 mg 1 tablet by mouth daily.  Tolerating Plaquenil without any side effects. Plaquenil eye exam: normal on 01/01/2022 WNL [place] Follow up in 1 year.  CBC and CMP within normal limits on 09/13/2021.  Orders for CBC and CMP were released today. - Plan: CBC with Differential/Platelet, COMPLETE METABOLIC PANEL WITH GFR  Raynaud's disease without gangrene: Not currently active.  No digital ulcerations or signs of gangrene.  Good capillary refill less than 2 seconds.  No signs of sclerodactyly noted.  Antiphospholipid antibody positive - History of beta-2 glycoprotein antibodies positive, anticardiolipin antibody positive, and lupus anticoagulant positive:   Patient was evaluated by Dr. Simeon Craft such on 03/12/2021 and the office visit note was reviewed today in the office.  No history of VTE.  Recommended continued use of preventative dose of aspirin 81 mg 2 tablets daily and plaquenil as prescribed.  The following lab work will be repeated today for further evaluation.  Dr. Alvy Bimler did not recommend routine follow-up. - Plan: Beta-2 glycoprotein antibodies, Cardiolipin antibodies, IgG, IgM, IgA, Lupus Anticoagulant Eval w/Reflex  Primary osteoarthritis of both hands: She has mild PIP and DIP thickening consistent with osteoarthritis of both hands.  No tenderness  or inflammation noted.  Complete fist formation bilaterally.  Discussed the importance of joint protection and muscle strengthening.  Primary osteoarthritis of both feet: She has PIP and DIP thickening consistent with osteoarthritis of both feet.  No tenderness or synovitis over MTP joints.  Ankle joints have good range of motion with no tenderness or synovitis.  She is wearing proper fitting shoes.  Primary osteoarthritis of both knees: She has good range of motion of both knee joints on examination today.  No warmth or effusion was noted.  Trochanteric bursitis, right hip: She has some mild, persistent tenderness over the right trochanteric bursa.  Her symptoms improved significantly after having a cortisone injection on 02/20/2021.  DDD (degenerative disc disease), cervical - MRI cervical spine multilevel DDD and spondylosis with moderate neuroforaminal narrowing.  She has been experiencing some increased neck pain and neuralgias in the left side of her neck.  She has a massage on maintenance basis which alleviates her discomfort.  DDD (degenerative disc disease), lumbar - MRI lumbar spine on 05/12/19-L5-S1 disc bulge and mild to moderate neural foraminal narrowing.  No midline spinal tenderness or symptoms of radiculopathy at this time.  Osteopenia of multiple sites - DEXA 07/25/2020 The BMD  measured at [facility] L1-L2 is 0.962 g/cm2 with a T-score of -1.7.  She is taking vitamin D 2000 units daily. Due to update DEXA in February 2024.  Vitamin D deficiency: Vitamin D is within normal limits: 42 on 02/20/2021.  She continues to take vitamin D 2000 units daily as a maintenance dose.  Other fatigue: Improved.  She has been walking 4 miles on a daily basis for exercise.  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  Yearly MRI recommended to monitor for recurrence.  Pitting edema: Very mild in bilateral lower extremities.  History of migraine  History of colonic polyps  Tinnitus, bilateral  Orders: Orders Placed This Encounter  Procedures   CBC with Differential/Platelet   COMPLETE METABOLIC PANEL WITH GFR   Protein / creatinine ratio, urine   ANA   Anti-DNA antibody, double-stranded   C3 and C4   Sedimentation rate   Beta-2 glycoprotein antibodies   Cardiolipin antibodies, IgG, IgM, IgA   Lupus Anticoagulant Eval w/Reflex   No orders of the defined types were placed in this encounter.   Follow-Up Instructions: No follow-ups on file.   Alexandra Neas, PA-C  Note - This record has been created using Dragon software.  Chart creation errors have been sought, but may not always  have been located. Such creation errors do not reflect on  the standard of medical care.

## 2022-02-15 ENCOUNTER — Ambulatory Visit: Payer: 59 | Admitting: Physician Assistant

## 2022-02-20 ENCOUNTER — Encounter: Payer: Self-pay | Admitting: Physician Assistant

## 2022-02-20 ENCOUNTER — Ambulatory Visit: Payer: 59 | Attending: Physician Assistant | Admitting: Physician Assistant

## 2022-02-20 VITALS — BP 121/82 | HR 59 | Resp 16 | Ht 66.0 in | Wt 139.2 lb

## 2022-02-20 DIAGNOSIS — M7061 Trochanteric bursitis, right hip: Secondary | ICD-10-CM

## 2022-02-20 DIAGNOSIS — Z79899 Other long term (current) drug therapy: Secondary | ICD-10-CM

## 2022-02-20 DIAGNOSIS — I7300 Raynaud's syndrome without gangrene: Secondary | ICD-10-CM

## 2022-02-20 DIAGNOSIS — M5136 Other intervertebral disc degeneration, lumbar region: Secondary | ICD-10-CM

## 2022-02-20 DIAGNOSIS — E559 Vitamin D deficiency, unspecified: Secondary | ICD-10-CM

## 2022-02-20 DIAGNOSIS — M19071 Primary osteoarthritis, right ankle and foot: Secondary | ICD-10-CM

## 2022-02-20 DIAGNOSIS — R760 Raised antibody titer: Secondary | ICD-10-CM | POA: Diagnosis not present

## 2022-02-20 DIAGNOSIS — M19072 Primary osteoarthritis, left ankle and foot: Secondary | ICD-10-CM

## 2022-02-20 DIAGNOSIS — Z860100 Personal history of colon polyps, unspecified: Secondary | ICD-10-CM

## 2022-02-20 DIAGNOSIS — H9313 Tinnitus, bilateral: Secondary | ICD-10-CM

## 2022-02-20 DIAGNOSIS — M359 Systemic involvement of connective tissue, unspecified: Secondary | ICD-10-CM | POA: Diagnosis not present

## 2022-02-20 DIAGNOSIS — R5383 Other fatigue: Secondary | ICD-10-CM

## 2022-02-20 DIAGNOSIS — M8589 Other specified disorders of bone density and structure, multiple sites: Secondary | ICD-10-CM

## 2022-02-20 DIAGNOSIS — Z8669 Personal history of other diseases of the nervous system and sense organs: Secondary | ICD-10-CM

## 2022-02-20 DIAGNOSIS — M5030 Other cervical disc degeneration, unspecified cervical region: Secondary | ICD-10-CM

## 2022-02-20 DIAGNOSIS — D219 Benign neoplasm of connective and other soft tissue, unspecified: Secondary | ICD-10-CM

## 2022-02-20 DIAGNOSIS — M51369 Other intervertebral disc degeneration, lumbar region without mention of lumbar back pain or lower extremity pain: Secondary | ICD-10-CM

## 2022-02-20 DIAGNOSIS — M19041 Primary osteoarthritis, right hand: Secondary | ICD-10-CM

## 2022-02-20 DIAGNOSIS — R609 Edema, unspecified: Secondary | ICD-10-CM

## 2022-02-20 DIAGNOSIS — M19042 Primary osteoarthritis, left hand: Secondary | ICD-10-CM

## 2022-02-20 DIAGNOSIS — M170 Bilateral primary osteoarthritis of knee: Secondary | ICD-10-CM

## 2022-02-20 NOTE — Progress Notes (Signed)
CMP WNL

## 2022-02-20 NOTE — Progress Notes (Signed)
CBC WNL

## 2022-02-21 NOTE — Progress Notes (Signed)
Protein creatinine ratio WNL.  ESR and complements WNL.

## 2022-02-22 NOTE — Progress Notes (Signed)
ANA negative.  dsDNA is negative.

## 2022-02-26 ENCOUNTER — Telehealth (HOSPITAL_BASED_OUTPATIENT_CLINIC_OR_DEPARTMENT_OTHER): Payer: Self-pay

## 2022-02-26 NOTE — Telephone Encounter (Signed)
Patient called today and would like a call back from Dr. Sabra Heck. She would like to speak to you about navigating a procedure. Her call back number is [phone]. [email or web address]

## 2022-02-26 NOTE — Progress Notes (Signed)
Beta-2 glycoprotein IgM remains positive and anticardiolipin IgM is borderline positive-stable.  She remains on aspirin 81 mg daily.

## 2022-02-28 LAB — CBC WITH DIFFERENTIAL/PLATELET
Absolute Monocytes: 340 cells/uL (ref 200–950)
Basophils Absolute: 32 cells/uL (ref 0–200)
Basophils Relative: 0.7 %
Eosinophils Absolute: 110 cells/uL (ref 15–500)
Eosinophils Relative: 2.4 %
HCT: 39.1 % (ref 35.0–45.0)
Hemoglobin: 13.1 g/dL (ref 11.7–15.5)
Lymphs Abs: 1297 cells/uL (ref 850–3900)
MCH: 28.7 pg (ref 27.0–33.0)
MCHC: 33.5 g/dL (ref 32.0–36.0)
MCV: 85.7 fL (ref 80.0–100.0)
MPV: 11.2 fL (ref 7.5–12.5)
Monocytes Relative: 7.4 %
Neutro Abs: 2820 cells/uL (ref 1500–7800)
Neutrophils Relative %: 61.3 %
Platelets: 271 10*3/uL (ref 140–400)
RBC: 4.56 10*6/uL (ref 3.80–5.10)
RDW: 12.8 % (ref 11.0–15.0)
Total Lymphocyte: 28.2 %
WBC: 4.6 10*3/uL (ref 3.8–10.8)

## 2022-02-28 LAB — LUPUS ANTICOAGULANT EVAL W/ REFLEX
PTT-LA Screen: 44 s — ABNORMAL HIGH (ref <=40)
dRVVT: 56 s — ABNORMAL HIGH (ref <=45)

## 2022-02-28 LAB — COMPLETE METABOLIC PANEL WITH GFR
AG Ratio: 1.8 (calc) (ref 1.0–2.5)
ALT: 15 U/L (ref 6–29)
AST: 20 U/L (ref 10–35)
Albumin: 4.2 g/dL (ref 3.6–5.1)
Alkaline phosphatase (APISO): 42 U/L (ref 37–153)
BUN: 9 mg/dL (ref 7–25)
CO2: 26 mmol/L (ref 20–32)
Calcium: 9.5 mg/dL (ref 8.6–10.4)
Chloride: 104 mmol/L (ref 98–110)
Creat: 0.82 mg/dL (ref 0.50–1.03)
Globulin: 2.3 g/dL (calc) (ref 1.9–3.7)
Glucose, Bld: 68 mg/dL (ref 65–99)
Potassium: 4.6 mmol/L (ref 3.5–5.3)
Sodium: 138 mmol/L (ref 135–146)
Total Bilirubin: 0.4 mg/dL (ref 0.2–1.2)
Total Protein: 6.5 g/dL (ref 6.1–8.1)
eGFR: 82 mL/min/{1.73_m2} (ref >=60)

## 2022-02-28 LAB — C3 AND C4
C3 Complement: 121 mg/dL (ref 83–193)
C4 Complement: 19 mg/dL (ref 15–57)

## 2022-02-28 LAB — CARDIOLIPIN ANTIBODIES, IGG, IGM, IGA
Anticardiolipin IgA: <2 APL-U/mL (ref <20.0)
Anticardiolipin IgG: 2.4 GPL-U/mL (ref <20.0)
Anticardiolipin IgM: 20.3 MPL-U/mL — ABNORMAL HIGH (ref <20.0)

## 2022-02-28 LAB — RFX DRVVT 1:1 MIX: PT, Mixing Interp: POSITIVE — AB

## 2022-02-28 LAB — RFLX DRVVT CONFRIM: DRVVT CONFIRM: POSITIVE — AB

## 2022-02-28 LAB — PROTEIN / CREATININE RATIO, URINE
Creatinine, Urine: 101 mg/dL (ref 20–275)
Protein/Creat Ratio: 59 mg/g creat (ref 24–184)
Protein/Creatinine Ratio: 0.059 mg/mg creat (ref 0.024–0.184)
Total Protein, Urine: 6 mg/dL (ref 5–24)

## 2022-02-28 LAB — RFLX HEXAGONAL PHASE CONFIRM: Hexagonal Phase Conf: POSITIVE — AB

## 2022-02-28 LAB — ANTI-DNA ANTIBODY, DOUBLE-STRANDED: ds DNA Ab: 1 IU/mL

## 2022-02-28 LAB — SEDIMENTATION RATE: Sed Rate: 9 mm/h (ref 0–30)

## 2022-02-28 LAB — BETA-2 GLYCOPROTEIN ANTIBODIES
Beta-2 Glyco 1 IgA: <2 U/mL (ref <20.0)
Beta-2 Glyco 1 IgM: 29.9 U/mL — ABNORMAL HIGH (ref <20.0)
Beta-2 Glyco I IgG: 2.9 U/mL (ref <20.0)

## 2022-02-28 LAB — ANA: Anti Nuclear Antibody (ANA): NEGATIVE

## 2022-02-28 NOTE — Progress Notes (Signed)
Lupus anticoagulant remains present.

## 2022-03-02 ENCOUNTER — Other Ambulatory Visit: Payer: Self-pay | Admitting: Rheumatology

## 2022-03-02 DIAGNOSIS — M359 Systemic involvement of connective tissue, unspecified: Secondary | ICD-10-CM

## 2022-03-04 NOTE — Telephone Encounter (Signed)
Next Visit: 07/24/2022  Last Visit: 02/20/2022  Labs: 02/20/2022 CBC WNL CMP WNL  Eye exam: 01/01/2022 WNL    Current Dose per office note 02/20/2022: Plaquenil 200 mg 1 tablet by mouth daily  QZ:[id number] disease   Last Fill: 12/03/2021  Okay to refill Plaquenil?

## 2022-03-14 ENCOUNTER — Other Ambulatory Visit (HOSPITAL_BASED_OUTPATIENT_CLINIC_OR_DEPARTMENT_OTHER): Payer: Self-pay | Admitting: Obstetrics & Gynecology

## 2022-03-14 DIAGNOSIS — Z79890 Hormone replacement therapy (postmenopausal): Secondary | ICD-10-CM

## 2022-03-18 ENCOUNTER — Encounter: Payer: Self-pay | Admitting: *Deleted

## 2022-05-02 ENCOUNTER — Encounter (HOSPITAL_BASED_OUTPATIENT_CLINIC_OR_DEPARTMENT_OTHER): Payer: Self-pay | Admitting: Obstetrics & Gynecology

## 2022-05-02 ENCOUNTER — Ambulatory Visit (INDEPENDENT_AMBULATORY_CARE_PROVIDER_SITE_OTHER): Payer: 59 | Admitting: Obstetrics & Gynecology

## 2022-05-02 VITALS — BP 123/58 | HR 54 | Ht 66.0 in | Wt 138.2 lb

## 2022-05-02 DIAGNOSIS — Z01419 Encounter for gynecological examination (general) (routine) without abnormal findings: Secondary | ICD-10-CM | POA: Diagnosis not present

## 2022-05-02 DIAGNOSIS — Z79890 Hormone replacement therapy (postmenopausal): Secondary | ICD-10-CM | POA: Diagnosis not present

## 2022-05-02 DIAGNOSIS — R760 Raised antibody titer: Secondary | ICD-10-CM

## 2022-05-02 DIAGNOSIS — Z1231 Encounter for screening mammogram for malignant neoplasm of breast: Secondary | ICD-10-CM | POA: Diagnosis not present

## 2022-05-02 DIAGNOSIS — Z9229 Personal history of other drug therapy: Secondary | ICD-10-CM

## 2022-05-02 DIAGNOSIS — Z23 Encounter for immunization: Secondary | ICD-10-CM | POA: Diagnosis not present

## 2022-05-02 MED ORDER — ESTROGENS CONJUGATED 0.625 MG PO TABS: ORAL_TABLET | ORAL | 3 refills | Status: DC

## 2022-05-02 NOTE — Progress Notes (Signed)
61 y.o. G1P1 Married White or Caucasian female here for annual exam.  Doing well.  Feels well.  Went to [place] last year.  Great trip.  Followed by rheumatology.    Patient's last menstrual period was 06/25/2003.          Sexually active: Yes.    The current method of family planning is status post hysterectomy.    Exercising: Yes.     walking Smoker:  no  Health Maintenance: Pap:  not indicated History of abnormal Pap:  no MMG:  05/02/2021 Negative Colonoscopy:  12/18/2018, follow up 10 years BMD:   07/25/2020 Screening Labs: dos with other provider   reports that she has never smoked. She has never been exposed to tobacco smoke. She has never used smokeless tobacco. She reports current alcohol use of about 2.0 - 3.0 standard drinks of alcohol per week. She reports that she does not use drugs.  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 Janice Coffin   COLONOSCOPY  03/28/2008   PELVIC LAPAROSCOPY     REDUCTION MAMMAPLASTY      Current Outpatient Medications  Medication Sig Dispense Refill   acetaminophen (TYLENOL) 500 MG tablet Take by mouth as needed.      aspirin 81 MG tablet Take 81 mg by mouth 2 (two) times daily.      hydroxychloroquine (PLAQUENIL) 200 MG tablet TAKE 1 TABLET BY MOUTH EVERY DAY 90 tablet 0   ibuprofen (ADVIL) 400 MG tablet Take by mouth as needed.     Multiple Vitamins-Minerals (PRESERVISION AREDS) CAPS Take 1 tablet by mouth daily.      Vitamin D, Cholecalciferol, 25  MCG (1000 UT) TABS Take 2 tablets by mouth daily.      estrogens, conjugated, (PREMARIN) 0.625 MG tablet TAKE 1 TABLET BY MOUTH DAILY. 90 tablet 3   No current facility-administered medications for this visit.    Family History  Problem Relation Age of Onset   Clotting disorder Mother    Prostate cancer Father    Colon polyps Father    Cervical cancer Maternal Grandmother    Healthy Daughter     ROS: Constitutional: negative Genitourinary:negative  Exam:   BP (!) 123/58 (BP Location: Right Arm, Patient Position: Sitting, Cuff Size: Normal)   Pulse (!) 54   Ht '5\' 6"'$  (1.676 m) Comment: Reported  Wt 138 lb 3.2 oz (62.7 kg)   LMP 06/25/2003   BMI 22.31 kg/m   Height: '5\' 6"'$  (167.6 cm) (Reported)  General appearance: alert, cooperative and appears stated age Head: Normocephalic, without obvious abnormality, atraumatic Neck: no adenopathy, supple, symmetrical, trachea midline and thyroid normal to inspection and palpation Lungs: clear to auscultation bilaterally Breasts: normal appearance, no masses or tenderness Heart: regular rate and rhythm Abdomen: soft, non-tender; bowel sounds normal; no masses,  no organomegaly Extremities: extremities normal, atraumatic, no cyanosis or edema Skin: Skin color, texture, turgor normal.  No rashes or lesions Lymph nodes: Cervical, supraclavicular, and axillary nodes normal. No abnormal inguinal nodes palpated Neurologic: Grossly normal   Pelvic: External genitalia:  no lesions              Urethra:  normal appearing urethra with no masses, tenderness or lesions              Bartholins and Skenes: normal                 Vagina: normal appearing vagina with normal color and no discharge, no lesions              Cervix: absent              Pap taken: No. Bimanual Exam:  Uterus:  uterus absent              Adnexa: no mass, fullness, tenderness               Rectovaginal: Confirms               Anus:  normal sphincter tone, no  lesions  Chaperone, Tonya , CMA, was present for exam.  Assessment/Plan: 1. Well woman exam with routine gynecological exam - Pap smear not indicated - Mammogram ordered - Colonoscopy 2020 - Bone mineral density 2022 - lab work dhas been done - vaccines reviewed/updated.  Tdap updated today.  2. Antiphospholipid antibody positive - followed by Dr. Bronson Curb and Dr. Alvy Bimler.  Also saw Dr. Marin Olp in the past to ensure continuing HRT was appropriate.   3. Encounter for screening mammogram for malignant neoplasm of breast - MM 3D SCREEN BREAST BILATERAL; Future  4. Hormone replacement therapy (HRT) - discussed decreasing dosing but she desires to continue.  She is aware of risks - estrogens, conjugated, (PREMARIN) 0.625 MG tablet; TAKE 1 TABLET BY MOUTH DAILY.  Dispense: 90 tablet; Refill: 3

## 2022-05-23 ENCOUNTER — Ambulatory Visit (HOSPITAL_BASED_OUTPATIENT_CLINIC_OR_DEPARTMENT_OTHER)
Admission: RE | Admit: 2022-05-23 | Discharge: 2022-05-23 | Disposition: A | Discharge status: Home / Self Care | Payer: 59 | Source: Ambulatory Visit | Attending: Obstetrics & Gynecology | Admitting: Obstetrics & Gynecology

## 2022-05-23 DIAGNOSIS — Z1231 Encounter for screening mammogram for malignant neoplasm of breast: Secondary | ICD-10-CM | POA: Insufficient documentation

## 2022-05-31 ENCOUNTER — Other Ambulatory Visit: Payer: Self-pay | Admitting: Physician Assistant

## 2022-05-31 DIAGNOSIS — M359 Systemic involvement of connective tissue, unspecified: Secondary | ICD-10-CM

## 2022-05-31 NOTE — Telephone Encounter (Signed)
Next Visit: 07/24/2022  Last Visit: 02/20/2022  Labs: 02/20/2022 CBC and CMP WNL, Protein creatinine ratio WNL. ESR and complements WNL, ANA negative.  dsDNA is negative, Beta-2 glycoprotein IgM remains positive and anticardiolipin IgM is borderline positive-stable. She remains on aspirin 81 mg daily.Lupus anticoagulant remains present.   Eye exam: 01/01/2022   Current Dose per office note on 02/20/2022: Plaquenil 200 mg 1 tablet by mouth daily.   MV:[id number] disease   Last Fill: 03/04/2022  Okay to refill Plaquenil?

## 2022-07-05 ENCOUNTER — Encounter: Payer: Self-pay | Admitting: Family Medicine

## 2022-07-05 ENCOUNTER — Ambulatory Visit: Payer: 59 | Admitting: Family Medicine

## 2022-07-05 VITALS — BP 124/78 | HR 57 | Temp 98.2°F | Ht 66.0 in | Wt 127.4 lb

## 2022-07-05 DIAGNOSIS — S20211A Contusion of right front wall of thorax, initial encounter: Secondary | ICD-10-CM

## 2022-07-05 DIAGNOSIS — M940 Chondrocostal junction syndrome [Tietze]: Secondary | ICD-10-CM | POA: Diagnosis not present

## 2022-07-05 NOTE — Progress Notes (Signed)
Subjective  CC:  Chief Complaint  Patient presents with   rib pains    Pt stated that she has been having Rt rib pain for the past 2 weeks. Pt threw something in the trash can and then went back in to get it and scrapped her ribs     HPI: Alexandra Garner is a 62 y.o. female who presents to the office today to address the problems listed above in the chief complaint. 62 year old female with autoimmune disorder but mostly healthy and active exercises regularly reports that she was trying to reach something in the bottom of her trash can and had to lean vigorously over the side.  Her right lower rib cage was to stop against the sidewall.  She felt acute pain as she lifted the bag out of the trash can and landed on her feet.  Since, she has pain over her right lower chest wall mostly with sitting and certain movements, especially after walking for exercise but does not have pleuritic chest pain, bruising or shortness of breath.  She has taken occasional Advil with relief of pain.  This started almost 2 weeks ago and she just wants to be sure that it is okay.  She has several questions.  Assessment  1. Rib contusion, right, initial encounter   2. Slipped rib syndrome      Plan  Rib pain/chest wall pain: Exam and history most consistent with diagnosis of rib contusion/musculoskeletal injury.  Discussed possibility of costochondritis, contusion, slipped rib syndrome.  She is stable.  No respiratory distress.  Very unlikely that she has a fracture.  Treat with 1 to 2-week course of NSAIDs, she will use over-the-counter Advil.  Discussed appropriate rest and appropriate exercises that should not harm her. Follow up: Physical at patient's convenience, last 2021 Visit date not found  No orders of the defined types were placed in this encounter.  No orders of the defined types were placed in this encounter.     I reviewed the patients updated PMH, FH, and SocHx.    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    aspirin 81 MG tablet Take 81 mg by mouth 2 (two) times daily.    estrogens, conjugated, (PREMARIN) 0.625 MG tablet TAKE 1 TABLET BY MOUTH DAILY.   hydroxychloroquine (PLAQUENIL) 200 MG tablet TAKE 1 TABLET BY MOUTH EVERY DAY   ibuprofen (ADVIL) 400 MG tablet Take by mouth as needed.   Multiple  Vitamins-Minerals (PRESERVISION AREDS) CAPS Take 1 tablet by mouth daily.    Vitamin D, Cholecalciferol, 25 MCG (1000 UT) TABS Take 2 tablets by mouth daily.     Allergies: Patient is allergic to penicillins, adhesive [tape], and latex. Family History: Patient family history includes Cervical cancer in her maternal grandmother; Clotting disorder in her mother; Colon polyps in her father; Healthy in her daughter; Prostate cancer in her father. Social History:  Patient  reports that she has never smoked. She has never been exposed to tobacco smoke. She has never used smokeless tobacco. She reports current alcohol use of about 2.0 - 3.0 standard drinks of alcohol per week. She reports  that she does not use drugs.  Review of Systems: Constitutional: Negative for fever malaise or anorexia Cardiovascular: negative for chest pain Respiratory: negative for SOB or persistent cough Gastrointestinal: negative for abdominal pain  Objective  Vitals: BP 124/78   Pulse (!) 57   Temp 98.2 F (36.8 C)   Ht '5\' 6"'$  (1.676 m)   Wt 127 lb 6.4 oz (57.8 kg)   LMP 06/25/2003   SpO2 99%   BMI 20.56 kg/m  General: no acute distress , A&Ox3, no respiratory distress.  No splinting.  Moves to exam table effortlessly. HEENT: PEERL, conjunctiva normal, neck is supple Cardiovascular:  RRR without murmur or gallop.  Respiratory:  Good breath sounds bilaterally, CTAB with normal respiratory effort, tenderness over right lower ribs and costochondral junction without bruising or crepitus. Skin:  Warm, no rashes  Commons side effects, risks, benefits, and alternatives for medications and treatment plan prescribed today were discussed, and the patient expressed understanding of the given instructions. Patient is instructed to call or message via MyChart if he/she has any questions or concerns regarding our treatment plan. No barriers to understanding were identified. We discussed Red Flag symptoms and signs in detail. Patient expressed understanding regarding what to do in case of urgent or emergency type symptoms.  Medication list was reconciled, printed and provided to the patient in AVS. Patient instructions and summary information was reviewed with the patient as documented in the AVS. This note was prepared with assistance of Dragon voice recognition software. Occasional wrong-word or sound-a-like substitutions may have occurred due to the inherent limitations of voice recognition software

## 2022-07-11 NOTE — Progress Notes (Unsigned)
Office Visit Note  Patient: Alexandra Garner             Date of Birth: 04/04/1961           MRN: [id number]             PCP: Leamon Arnt, MD Referring: Leamon Arnt, MD Visit Date: 07/24/2022 Occupation: '@GUAROCC'$ @  Subjective:  Pain in multiple joints   History of Present Illness: Alexandra Garner is a 62 y.o. female with history of autoimmune disease and osteoarthritis. Alexandra Garner is taking plaquenil 200 mg 1 tablet by mouth daily.  Alexandra Garner continues to tolerate Plaquenil without any side effects and has not missed any doses recently.  Patient reports over the past 3 months Alexandra Garner has been experiencing increased pain involving multiple joints.  Her pain has been most severe in the left shoulder, right hip, right knee, and right great toe.  Alexandra Garner denies any obvious joint swelling.  Alexandra Garner has been taking ibuprofen 1-2 times daily for symptomatic relief.  Patient reports Alexandra Garner is also had some increased fatigue over the past several months to the point that Alexandra Garner just generally does not feel well.  Alexandra Garner states that Alexandra Garner has had less frequent and less severe Malar rashes.  Alexandra Garner denies any swollen lymph nodes.  Alexandra Garner has not had any sores in her mouth or nose.  Alexandra Garner denies any sicca symptoms.  Alexandra Garner has not noticed any hair loss.  Alexandra Garner continues to have intermittent symptoms of Raynaud's with the cooler weather temperatures.  Activities of Daily Living:  Patient reports morning stiffness for 30  minutes.   Patient Reports nocturnal pain.  Difficulty dressing/grooming: Denies Difficulty climbing stairs: Reports Difficulty getting out of chair: Denies Difficulty using hands for taps, buttons, cutlery, and/or writing: Reports  Review of Systems  Constitutional:  Positive for fatigue.  HENT:  Negative for mouth sores and mouth dryness.   Eyes:  Negative for dryness.  Respiratory:  Negative for shortness of breath.   Cardiovascular:  Negative for chest pain and palpitations.  Gastrointestinal:  Positive for blood in stool.  Negative for constipation and diarrhea.  Endocrine: Negative for increased urination.  Genitourinary:  Negative for involuntary urination.  Musculoskeletal:  Positive for joint pain, joint pain, joint swelling, myalgias, muscle weakness, morning stiffness, muscle tenderness and myalgias. Negative for gait problem.  Skin:  Negative for color change, rash, hair loss and sensitivity to sunlight.  Allergic/Immunologic: Negative for susceptible to infections.  Neurological:  Negative for dizziness and headaches.  Hematological:  Negative for swollen glands.  Psychiatric/Behavioral:  Negative for depressed mood and sleep disturbance. The patient is not nervous/anxious.     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   Family history of macular degeneration 02/03/2015   Primary localized osteoarthrosis of hand 12/23/2013   History of colonic polyps 11/01/2008    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 Janice Coffin   COLONOSCOPY  03/28/2008   PELVIC LAPAROSCOPY     REDUCTION MAMMAPLASTY     Social History   Social History Narrative   Not on file   Immunization History  Administered Date(s) Administered   COVID-19, mRNA, vaccine(Comirnaty)12 years and older 04/06/2022   PFIZER(Purple Top)SARS-COV-2 Vaccination 07/20/2019, 08/11/2019, 03/30/2020, 10/30/2020, 04/18/2022   Pfizer Covid-19 Vaccine Bivalent Booster 8yr & up 04/01/2021   Tdap 10/01/2011, 05/02/2022   Zoster Recombinat (Shingrix) 04/15/2019, 06/17/2019     Objective: Vital Signs: BP 122/72 (BP Location: Left Arm, Patient Position: Sitting, Cuff Size: Normal)   Pulse (!) 57   Resp 12   Ht '5\' 6"'$  (1.676 m)   Wt 140 lb (63.5 kg)   LMP 06/25/2003   BMI 22.60 kg/m    Physical Exam Vitals and nursing note reviewed.  Constitutional:      Appearance: Alexandra Garner is well-developed.  HENT:     Head: Normocephalic and atraumatic.  Eyes:     Conjunctiva/sclera: Conjunctivae normal.  Cardiovascular:     Rate and Rhythm: Normal rate and regular rhythm.     Heart sounds: Normal heart sounds.  Pulmonary:     Effort: Pulmonary effort is normal.     Breath sounds: Normal breath sounds.  Abdominal:     General: Bowel sounds are normal.     Palpations: Abdomen is soft.  Musculoskeletal:     Cervical back: Normal range of motion.  Skin:    General: Skin is warm and dry.     Capillary Refill: Capillary refill takes less than 2 seconds.  Neurological:     Mental Status: Alexandra Garner is alert and oriented to person, place, and time.  Psychiatric:        Behavior: Behavior normal.      Musculoskeletal Exam: C-spine has good range of motion with no discomfort.  Some trapezius muscle tension and tenderness on the left side.  Both shoulder joints have good range of motion with some discomfort in the left shoulder.   Tenderness over the left subacromial bursa.  Elbow joints, wrist joints, MCPs, PIPs, DIPs have good range of motion with no synovitis.  Complete fist formation bilaterally.  Hip joints have good range of motion with no groin pain.  Tenderness over the right trochanteric bursa and along the IT band.  Knee joints have good range of motion with no warmth or effusion.  Ankle joints have good range of motion with no tenderness or synovitis.  No evidence of Achilles tendinitis or plantar fasciitis.  Tenderness and thickening of the right first MTP joint.  No synovitis over MTP joints noted.  CDAI Exam: CDAI Score: -- Patient Global: --; Provider Global: -- Swollen: --; Tender: -- Joint Exam 07/24/2022   No joint exam has been documented for this visit   There is currently no information documented on the homunculus.  Go to the Rheumatology activity and complete the homunculus joint exam.  Investigation: No additional findings.  Imaging: No results found.  Recent Labs: Lab Results  Component Value Date   WBC 4.6 02/20/2022   HGB 13.1 02/20/2022   PLT 271 02/20/2022   NA 138 02/20/2022   K 4.6 02/20/2022   CL 104 02/20/2022   CO2 26 02/20/2022   GLUCOSE 68 02/20/2022   BUN 9 02/20/2022   CREATININE 0.82 02/20/2022   BILITOT 0.4 02/20/2022   ALKPHOS 33 (L) 01/10/2020   AST 20 02/20/2022   ALT 15 02/20/2022   PROT 6.5 02/20/2022   ALBUMIN 3.9 01/10/2020   CALCIUM 9.5 02/20/2022   GFRAA 99 08/29/2020    Speciality Comments: Plaquenil eye exam: normal on 01/01/2022 [facility] [place] Follow up in 1 year.   Procedures:  No procedures performed Allergies: Penicillins, Adhesive [tape], and Latex      Assessment / Plan:     Visit Diagnoses: Autoimmune disease ([place]) - History of arthralgias, [name], photosensitivity, positive aCL IgM46, positive beta 2 IgM 100: Patient remains on Plaquenil 200 mg 1 tablet by mouth daily.  Over the past 3 months Alexandra Garner has been experiencing  increased arthralgias in the left shoulder, right hip, right knee, and right great toe.  Alexandra Garner has not noticed any joint swelling.  No synovitis was noted on examination today.  Patient declined updated x-rays at this time.  Alexandra Garner has been taking ibuprofen once to twice daily for pain relief. Alexandra Garner has not been experiencing any other signs or symptoms of a flare.  Alexandra Garner has not had any oral or nasal ulcerations, sicca symptoms, hair loss, cervical lymphadenopathy, or increased photosensitivity.  Alexandra Garner has had less severe and less frequent Malar rashes.  Alexandra Garner has infrequent symptoms of Raynaud's phenomenon.  Good capillary refill noted on examination today. Lab work from 02/20/22 was reviewed today in the office: Protein creatinine ratio WNL, Beta-2 glycoprotein IgM 29.9, anticardiolipin IgM 20.3, lupus anticoagulant is detected, complements WNL, ANA negative, dsDNA negative-1, ESR 9.  Labs were not consistent with a flare at that time.  The following lab work will be obtained today for further evaluation.  Will notify her of lab results.  Alexandra Garner will remain on Plaquenil as prescribed.  Alexandra Garner was advised to notify us if Alexandra Garner develops any new or worsening symptoms.  Alexandra Garner will follow-up in the office in 5 months or sooner if needed.  - Plan: CBC with Differential/Platelet, COMPLETE METABOLIC PANEL WITH GFR, Protein / creatinine ratio, urine, Anti-DNA antibody, double-stranded, C3 and C4, Sedimentation rate, ANA, VITAMIN D 25 Hydroxy (Vit-D Deficiency, Fractures), Beta-2 glycoprotein antibodies, Cardiolipin antibodies, IgG, IgM, IgA, Lupus Anticoagulant Eval w/Reflex, Lipid panel  High risk medication use - Plaquenil 200 mg 1 tablet by mouth daily. Plaquenil eye exam: normal on 01/01/2022 [facility] [place] Follow up in 1 year.  CBC and CMP within normal limits on 02/20/2022.  Orders for CBC and CMP were released today. - Plan: CBC with Differential/Platelet, COMPLETE METABOLIC PANEL WITH GFR, Lipid panel  Raynaud's  disease without gangrene: Not currently active.  Discussed the importance of keeping her core body temperature warm as well as wearing gloves and socks especially if planning to be outdoors. Good capillary refill noted on examination today.  Antiphospholipid antibody positive - History of beta-2 glycoprotein antibodies positive, anticardiolipin antibody positive, and lupus anticoagulant positive: No history of VTE. Evaluated by Dr. Alvy Bimler on 03/12/21-recommended Aspirin 162 mg daily and continued use of  plaquenil.   The following lab work will be updated today.   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  No history of VTE. The following lab work was updated today. Alexandra Garner will remain on Plaquenil as prescribed and aspirin 162 mg daily.  Plan: Beta-2 glycoprotein antibodies, Cardiolipin antibodies, IgG, IgM, IgA, Lupus Anticoagulant Eval w/Reflex  Polyarthralgia - Alexandra Garner has been experiencing increased arthralgias over the past 3 months.  Alexandra Garner has not noticed any joint swelling.  Alexandra Garner has been taking ibuprofen once to twice daily for pain relief. Her discomfort has been most severe in the left shoulder, right trochanteric bursa, right knee joint, and right great toe.  Alexandra Garner has been unable to identify a trigger for the increased arthralgias other and weather changes.  Alexandra Garner continues to exercise on a regular basis and has been going for massage once a week.  On examination Alexandra Garner has tenderness over the left subacromial bursa as well as over the right trochanteric bursa.  Her right knee joint has good range of motion with no warmth or effusion.  Alexandra Garner has some thickening and tenderness over the right first MTP joint.  No synovitis was noted.  The following lab work will be obtained today for further evaluation.  Encourage patient to remain on Plaquenil as prescribed.  Alexandra Garner plans on trying to use Voltaren gel topically as needed for pain relief.  Alexandra Garner will notify us  if her symptoms persist or worsen.  Plan: Sedimentation rate, Rheumatoid factor, Cyclic citrul peptide antibody, IgG, C-reactive protein  Chronic left shoulder pain: Alexandra Garner presents today with increased pain in the left shoulder. A handout of exercises were provided to the patient today.  Patient declined updated x-rays of the left shoulder today.  Alexandra Garner plans on performing shoulder ROM exercises and will try using voltaren gel topically for pain relief.   Primary osteoarthritis of both hands: Mild PIP and DIP prominence. No synovitis noted.  Complete fist formation bilaterally. Discussed the importance of joint protection and muscle strengthening.   Primary osteoarthritis of both feet: Alexandra Garner has thickening of bilateral first MTP joints with tenderness over the right great toe.  No synovitis noted. Discussed the importance of wearing proper fitting shoes.  Alexandra Garner can use voltaren gel topically as needed for pain relief.   Primary osteoarthritis of both knees: Good range of motion of both knee joints on examination today.  Alexandra Garner has been experiencing intermittent discomfort in the right knee joint.  No warmth or effusion noted.  Alexandra Garner was advised to notify us if her right knee joint persist or worsens at which time Alexandra Garner can return for a right knee joint cortisone injection if needed.  Discussed the importance of lower extremity muscle strengthening.  Trochanteric bursitis, right hip: Patient presents today with discomfort in the right hip consistent with trochanteric bursitis and IT band syndrome.   No groin pain currently. Alexandra Garner has been performing stretching exercises on a regular basis and has been having a massage on a weekly basis.  Discussed the use of topical agents as well as the importance of performing stretching exercises daily.  Alexandra Garner will notify us if her symptoms persist or worsen at which time Alexandra Garner can return for a right trochanteric bursa cortisone injection if needed.  DDD (degenerative disc disease),  cervical - MRI cervical spine multilevel DDD and spondylosis with moderate neuroforaminal narrowing.  C-spine has good ROM with no discomfort.  Alexandra Garner has left trapezius muscle tension and tenderness.  Alexandra Garner has weekly massages which alleviate her discomfort.   DDD (degenerative disc disease), lumbar - MRI lumbar spine on 05/12/19-L5-S1 disc bulge and mild to moderate neural foraminal narrowing.  No midline spinal tenderness at this time.   Osteopenia of multiple sites - DEXA 07/25/2020 The BMD measured at [facility] L1-L2 is 0.962 g/cm2 with a T-score of -1.7.  Due to update DEXA in February 2024. Alexandra Garner continues to take vitamin D 2000 units daily.   Vitamin D deficiency: Alexandra Garner is taking vitamin D 2000 units daily.   Other fatigue: Alexandra Garner has been experiencing increased fatigue over the past 3 months.  The following lab work was updated today for further evaluation.    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  Yearly MRI recommended to monitor for recurrence.  Pitting edema  History of migraine  History of colonic polyps  Tinnitus, bilateral    Orders: Orders Placed This Encounter  Procedures   CBC with Differential/Platelet   COMPLETE METABOLIC PANEL WITH GFR   Protein / creatinine ratio, urine   Anti-DNA antibody, double-stranded   C3 and C4   Sedimentation rate   ANA   VITAMIN D 25 Hydroxy (Vit-D Deficiency, Fractures)   Rheumatoid factor   Cyclic citrul peptide antibody, IgG   C-reactive protein   Beta-2 glycoprotein antibodies   Cardiolipin antibodies, IgG, IgM, IgA   Lupus Anticoagulant Eval w/Reflex   Lipid panel   No orders of the defined types were placed in this encounter.   Follow-Up Instructions: Return in about 5 months (around 12/22/2022) for Autoimmune Disease.   Ofilia Neas, PA-C  Note - This record has been created using Dragon software.  Chart creation errors have been sought, but may not always  have  been located. Such creation errors do not reflect on  the standard of medical care.

## 2022-07-24 ENCOUNTER — Encounter: Payer: Self-pay | Admitting: Physician Assistant

## 2022-07-24 ENCOUNTER — Ambulatory Visit: Payer: 59 | Attending: Physician Assistant | Admitting: Physician Assistant

## 2022-07-24 VITALS — BP 122/72 | HR 57 | Resp 12 | Ht 66.0 in | Wt 140.0 lb

## 2022-07-24 DIAGNOSIS — R760 Raised antibody titer: Secondary | ICD-10-CM | POA: Diagnosis not present

## 2022-07-24 DIAGNOSIS — G8929 Other chronic pain: Secondary | ICD-10-CM

## 2022-07-24 DIAGNOSIS — Z79899 Other long term (current) drug therapy: Secondary | ICD-10-CM | POA: Diagnosis not present

## 2022-07-24 DIAGNOSIS — M359 Systemic involvement of connective tissue, unspecified: Secondary | ICD-10-CM | POA: Diagnosis not present

## 2022-07-24 DIAGNOSIS — I7300 Raynaud's syndrome without gangrene: Secondary | ICD-10-CM

## 2022-07-24 DIAGNOSIS — H9313 Tinnitus, bilateral: Secondary | ICD-10-CM

## 2022-07-24 DIAGNOSIS — M19071 Primary osteoarthritis, right ankle and foot: Secondary | ICD-10-CM

## 2022-07-24 DIAGNOSIS — D219 Benign neoplasm of connective and other soft tissue, unspecified: Secondary | ICD-10-CM

## 2022-07-24 DIAGNOSIS — M25512 Pain in left shoulder: Secondary | ICD-10-CM

## 2022-07-24 DIAGNOSIS — M170 Bilateral primary osteoarthritis of knee: Secondary | ICD-10-CM

## 2022-07-24 DIAGNOSIS — Z8669 Personal history of other diseases of the nervous system and sense organs: Secondary | ICD-10-CM

## 2022-07-24 DIAGNOSIS — M5030 Other cervical disc degeneration, unspecified cervical region: Secondary | ICD-10-CM

## 2022-07-24 DIAGNOSIS — M51369 Other intervertebral disc degeneration, lumbar region without mention of lumbar back pain or lower extremity pain: Secondary | ICD-10-CM

## 2022-07-24 DIAGNOSIS — M7061 Trochanteric bursitis, right hip: Secondary | ICD-10-CM

## 2022-07-24 DIAGNOSIS — M2550 Pain in unspecified joint: Secondary | ICD-10-CM

## 2022-07-24 DIAGNOSIS — Z860100 Personal history of colon polyps, unspecified: Secondary | ICD-10-CM

## 2022-07-24 DIAGNOSIS — M19072 Primary osteoarthritis, left ankle and foot: Secondary | ICD-10-CM

## 2022-07-24 DIAGNOSIS — M5136 Other intervertebral disc degeneration, lumbar region: Secondary | ICD-10-CM

## 2022-07-24 DIAGNOSIS — R609 Edema, unspecified: Secondary | ICD-10-CM

## 2022-07-24 DIAGNOSIS — R5383 Other fatigue: Secondary | ICD-10-CM

## 2022-07-24 DIAGNOSIS — M19041 Primary osteoarthritis, right hand: Secondary | ICD-10-CM

## 2022-07-24 DIAGNOSIS — M19042 Primary osteoarthritis, left hand: Secondary | ICD-10-CM

## 2022-07-24 DIAGNOSIS — E559 Vitamin D deficiency, unspecified: Secondary | ICD-10-CM

## 2022-07-24 DIAGNOSIS — M8589 Other specified disorders of bone density and structure, multiple sites: Secondary | ICD-10-CM

## 2022-07-24 NOTE — Patient Instructions (Signed)
Shoulder Exercises Ask your health care provider which exercises are safe for you. Do exercises exactly as told by your health care provider and adjust them as directed. It is normal to feel mild stretching, pulling, tightness, or discomfort as you do these exercises. Stop right away if you feel sudden pain or your pain gets worse. Do not begin these exercises until told by your health care provider. Stretching exercises External rotation and abduction This exercise is sometimes called corner stretch. The exercise rotates your arm outward (external rotation) and moves your arm out from your body (abduction). Stand in a doorway with one of your feet slightly in front of the other. This is called a staggered stance. If you cannot reach your forearms to the door frame, stand facing a corner of a room. Choose one of the following positions as told by your health care provider: Place your hands and forearms on the door frame above your head. Place your hands and forearms on the door frame at the height of your head. Place your hands on the door frame at the height of your elbows. Slowly move your weight onto your front foot until you feel a stretch across your chest and in the front of your shoulders. Keep your head and chest upright and keep your abdominal muscles tight. Hold for __________ seconds. To release the stretch, shift your weight to your back foot. Repeat __________ times. Complete this exercise __________ times a day. Extension, standing  Stand and hold a broomstick, a cane, or a similar object behind your back. Your hands should be a little wider than shoulder-width apart. Your palms should face away from your back. Keeping your elbows straight and your shoulder muscles relaxed, move the stick away from your body until you feel a stretch in your shoulders (extension). Avoid shrugging your shoulders while you move the stick. Keep your shoulder blades tucked down toward the middle of your  back. Hold for __________ seconds. Slowly return to the starting position. Repeat __________ times. Complete this exercise __________ times a day. Range-of-motion exercises Pendulum  Stand near a wall or a surface that you can hold onto for balance. Bend at the waist and let your left / right arm hang straight down. Use your other arm to support you. Keep your back straight and do not lock your knees. Relax your left / right arm and shoulder muscles, and move your hips and your trunk so your left / right arm swings freely. Your arm should swing because of the motion of your body, not because you are using your arm or shoulder muscles. Keep moving your hips and trunk so your arm swings in the following directions, as told by your health care provider: Side to side. Forward and backward. In clockwise and counterclockwise circles. Continue each motion for __________ seconds, or for as long as told by your health care provider. Slowly return to the starting position. Repeat __________ times. Complete this exercise __________ times a day. Shoulder flexion, standing  Stand and hold a broomstick, a cane, or a similar object. Place your hands a little more than shoulder-width apart on the object. Your left / right hand should be palm-up, and your other hand should be palm-down. Keep your elbow straight and your shoulder muscles relaxed. Push the stick up with your healthy arm to raise your left / right arm in front of your body, and then over your head until you feel a stretch in your shoulder (flexion). Avoid shrugging your shoulder   while you raise your arm. Keep your shoulder blade tucked down toward the middle of your back. Hold for __________ seconds. Slowly return to the starting position. Repeat __________ times. Complete this exercise __________ times a day. Shoulder abduction, standing  Stand and hold a broomstick, a cane, or a similar object. Place your hands a little more than  shoulder-width apart on the object. Your left / right hand should be palm-up, and your other hand should be palm-down. Keep your elbow straight and your shoulder muscles relaxed. Push the object across your body toward your left / right side. Raise your left / right arm to the side of your body (abduction) until you feel a stretch in your shoulder. Do not raise your arm above shoulder height unless your health care provider tells you to do that. If directed, raise your arm over your head. Avoid shrugging your shoulder while you raise your arm. Keep your shoulder blade tucked down toward the middle of your back. Hold for __________ seconds. Slowly return to the starting position. Repeat __________ times. Complete this exercise __________ times a day. Internal rotation  Place your left / right hand behind your back, palm-up. Use your other hand to dangle an exercise band, a broomstick, or a similar object over your shoulder. Grasp the band with your left / right hand so you are holding on to both ends. Gently pull up on the band until you feel a stretch in the front of your left / right shoulder. The movement of your arm toward the center of your body is called internal rotation. Avoid shrugging your shoulder while you raise your arm. Keep your shoulder blade tucked down toward the middle of your back. Hold for __________ seconds. Release the stretch by letting go of the band and lowering your hands. Repeat __________ times. Complete this exercise __________ times a day. Strengthening exercises External rotation  Sit in a stable chair without armrests. Secure an exercise band to a stable object at elbow height on your left / right side. Place a soft object, such as a folded towel or a small pillow, between your left / right upper arm and your body to move your elbow about 4 inches (10 cm) away from your side. Hold the end of the exercise band so it is tight and there is no slack. Keeping your  elbow pressed against the soft object, slowly move your forearm out, away from your abdomen (external rotation). Keep your body steady so only your forearm moves. Hold for __________ seconds. Slowly return to the starting position. Repeat __________ times. Complete this exercise __________ times a day. Shoulder abduction  Sit in a stable chair without armrests, or stand up. Hold a __________ lb / kg weight in your left / right hand, or hold an exercise band with both hands. Start with your arms straight down and your left / right palm facing in, toward your body. Slowly lift your left / right hand out to your side (abduction). Do not lift your hand above shoulder height unless your health care provider tells you that this is safe. Keep your arms straight. Avoid shrugging your shoulder while you do this movement. Keep your shoulder blade tucked down toward the middle of your back. Hold for __________ seconds. Slowly lower your arm, and return to the starting position. Repeat __________ times. Complete this exercise __________ times a day. Shoulder extension  Sit in a stable chair without armrests, or stand up. Secure an exercise band to a   stable object in front of you so it is at shoulder height. Hold one end of the exercise band in each hand. Straighten your elbows and lift your hands up to shoulder height. Squeeze your shoulder blades together as you pull your hands down to the sides of your thighs (extension). Stop when your hands are straight down by your sides. Do not let your hands go behind your body. Hold for __________ seconds. Slowly return to the starting position. Repeat __________ times. Complete this exercise __________ times a day. Shoulder row  Sit in a stable chair without armrests, or stand up. Secure an exercise band to a stable object in front of you so it is at chest height. Hold one end of the exercise band in each hand. Position your palms so that your thumbs are  facing the ceiling (neutral position). Bend each of your elbows to a 90-degree angle (right angle) and keep your upper arms at your sides. Step back or move the chair back until the band is tight and there is no slack. Slowly pull your elbows back behind you. Hold for __________ seconds. Slowly return to the starting position. Repeat __________ times. Complete this exercise __________ times a day. Shoulder press-ups  Sit in a stable chair that has armrests. Sit upright, with your feet flat on the floor. Put your hands on the armrests so your elbows are bent and your fingers are pointing forward. Your hands should be about even with the sides of your body. Push down on the armrests and use your arms to lift yourself off the chair. Straighten your elbows and lift yourself up as much as you comfortably can. Move your shoulder blades down, and avoid letting your shoulders move up toward your ears. Keep your feet on the ground. As you get stronger, your feet should support less of your body weight as you lift yourself up. Hold for __________ seconds. Slowly lower yourself back into the chair. Repeat __________ times. Complete this exercise __________ times a day. Wall push-ups  Stand so you are facing a stable wall. Your feet should be about one arm-length away from the wall. Lean forward and place your palms on the wall at shoulder height. Keep your feet flat on the floor as you bend your elbows and lean forward toward the wall. Hold for __________ seconds. Straighten your elbows to push yourself back to the starting position. Repeat __________ times. Complete this exercise __________ times a day. Hip Bursitis Rehab Ask your health care provider which exercises are safe for you. Do exercises exactly as told by your health care provider and adjust them as directed. It is normal to feel mild stretching, pulling, tightness, or discomfort as you do these exercises. Stop right away if you feel  sudden pain or your pain gets worse. Do not begin these exercises until told by your health care provider. Stretching exercise This exercise warms up your muscles and joints and improves the movement and flexibility of your hip. This exercise also helps to relieve pain and stiffness. Iliotibial band stretch An iliotibial band is a strong band of muscle tissue that runs from the outer side of your hip to the outer side of your thigh and knee. Lie on your side with your left / right leg in the top position. Bend your left / right knee and grab your ankle. Stretch out your bottom arm to help you balance. Slowly bring your knee back so your thigh is slightly behind your body. Slowly lower  your knee toward the floor until you feel a gentle stretch on the outside of your left / right thigh. If you do not feel a stretch and your knee will not lower more toward the floor, place the heel of your other foot on top of your knee and pull your knee down toward the floor with your foot. Hold this position for __________ seconds. Slowly return to the starting position. Repeat __________ times. Complete this exercise __________ times a day. Strengthening exercises These exercises build strength and endurance in your hip and pelvis. Endurance is the ability to use your muscles for a long time, even after they get tired. Bridge This exercise strengthens the muscles that move your thigh backward (hip extensors). Lie on your back on a firm surface with your knees bent and your feet flat on the floor. Tighten your buttocks muscles and lift your buttocks off the floor until your trunk is level with your thighs. Do not arch your back. You should feel the muscles working in your buttocks and the back of your thighs. If you do not feel these muscles, slide your feet 1-2 inches (2.5-5 cm) farther away from your buttocks. If this exercise is too easy, try doing it with your arms crossed over your chest. Hold this position  for __________ seconds. Slowly lower your hips to the starting position. Let your muscles relax completely after each repetition. Repeat __________ times. Complete this exercise __________ times a day. Squats This exercise strengthens the muscles in front of your thigh and knee (quadriceps). Stand in front of a table, with your feet and knees pointing straight ahead. You may rest your hands on the table for balance but not for support. Slowly bend your knees and lower your hips like you are going to sit in a chair. Keep your weight over your heels, not over your toes. Keep your lower legs upright so they are parallel with the table legs. Do not let your hips go lower than your knees. Do not bend lower than told by your health care provider. If your hip pain increases, do not bend as low. Hold the squat position for __________ seconds. Slowly push with your legs to return to standing. Do not use your hands to pull yourself to standing. Repeat __________ times. Complete this exercise __________ times a day. Hip hike  Stand sideways on a bottom step. Stand on your left / right leg with your other foot unsupported next to the step. You can hold on to the railing or wall for balance if needed. Keep your knees straight and your torso square. Then lift your left / right hip up toward the ceiling. Hold this position for __________ seconds. Slowly let your left / right hip lower toward the floor, past the starting position. Your foot should get closer to the floor. Do not lean or bend your knees. Repeat __________ times. Complete this exercise __________ times a day. Single leg stand This exercise increases your balance. Without shoes, stand near a railing or in a doorway. You may hold on to the railing or door frame as needed for balance. Squeeze your left / right buttock muscles, then lift up your other foot. Do not let your left / right hip push out to the side. It is helpful to stand in front  of a mirror for this exercise so you can watch your hip. Hold this position for __________ seconds. Repeat __________ times. Complete this exercise __________ times a day. This information is  not intended to replace advice given to you by your health care provider. Make sure you discuss any questions you have with your health care provider. Document Revised: 05/23/2021 Document Reviewed: 05/23/2021 Elsevier Patient Education  [place] Band Syndrome Rehab Ask your health care provider which exercises are safe for you. Do exercises exactly as told by your health care provider and adjust them as directed. It is normal to feel mild stretching, pulling, tightness, or discomfort as you do these exercises. Stop right away if you feel sudden pain or your pain gets significantly worse. Do not begin these exercises until told by your health care provider. Stretching and range-of-motion exercises These exercises warm up your muscles and joints and improve the movement and flexibility of your hip and pelvis. Quadriceps stretch, prone  Lie on your abdomen (prone position) on a firm surface, such as a bed or padded floor. Bend your left / right knee and reach back to hold your ankle or pant leg. If you cannot reach your ankle or pant leg, loop a belt around your foot and grab the belt instead. Gently pull your heel toward your buttocks. Your knee should not slide out to the side. You should feel a stretch in the front of your thigh and knee (quadriceps). Hold this position for __________ seconds. Repeat __________ times. Complete this exercise __________ times a day. Iliotibial band stretch An iliotibial band is a strong band of muscle tissue that runs from the outer side of your hip to the outer side of your thigh and knee. Lie on your side with your left / right leg in the top position. Bend both of your knees and grab your left / right ankle. Stretch out your bottom arm to help you  balance. Slowly bring your top knee back so your thigh goes behind your trunk. Slowly lower your top leg toward the floor until you feel a gentle stretch on the outside of your left / right hip and thigh. If you do not feel a stretch and your knee will not fall farther, place the heel of your other foot on top of your knee and pull your knee down toward the floor with your foot. Hold this position for __________ seconds. Repeat __________ times. Complete this exercise __________ times a day. Strengthening exercises These exercises build strength and endurance in your hip and pelvis. Endurance is the ability to use your muscles for a long time, even after they get tired. Straight leg raises, side-lying This exercise strengthens the muscles that rotate the leg at the hip and move it away from your body (hip abductors). Lie on your side with your left / right leg in the top position. Lie so your head, shoulder, hip, and knee line up. You may bend your bottom knee to help you balance. Roll your hips slightly forward so your hips are stacked directly over each other and your left / right knee is facing forward. Tense the muscles in your outer thigh and lift your top leg 4-6 inches (10-15 cm). Hold this position for __________ seconds. Slowly lower your leg to return to the starting position. Let your muscles relax completely before doing another repetition. Repeat __________ times. Complete this exercise __________ times a day. Leg raises, prone This exercise strengthens the muscles that move the hips backward (hip extensors). Lie on your abdomen (prone position) on your bed or a firm surface. You can put a pillow under your hips if that is more comfortable  for your lower back. Bend your left / right knee so your foot is straight up in the air. Squeeze your buttocks muscles and lift your left / right thigh off the bed. Do not let your back arch. Tense your thigh muscle as hard as you can without  increasing any knee pain. Hold this position for __________ seconds. Slowly lower your leg to return to the starting position and allow it to relax completely. Repeat __________ times. Complete this exercise __________ times a day. Hip hike Stand sideways on a bottom step. Stand on your left / right leg with your other foot unsupported next to the step. You can hold on to a railing or wall for balance if needed. Keep your knees straight and your torso square. Then lift your left / right hip up toward the ceiling. Slowly let your left / right hip lower toward the floor, past the starting position. Your foot should get closer to the floor. Do not lean or bend your knees. Repeat __________ times. Complete this exercise __________ times a day. This information is not intended to replace advice given to you by your health care provider. Make sure you discuss any questions you have with your health care provider. Document Revised: 08/18/2019 Document Reviewed: 08/18/2019 Elsevier Patient Education  [place].

## 2022-07-26 NOTE — Progress Notes (Signed)
Cholesterol is elevated-215.  LDL is slightly elevated-108.  Please notify the patient and forward results to PCP.   CBC and CMP WNL.  ESR Wnl.  Vitamin D WNL.  Protein creatinine ratio WNL.  RF negative.  CRP WNL.   dsDNA negative.  Complements WNL.

## 2022-07-28 NOTE — Progress Notes (Signed)
ANA remains positive-low titer. Anti-CCP negative.

## 2022-07-31 LAB — RFLX HEXAGONAL PHASE CONFIRM: Hexagonal Phase Conf: POSITIVE — AB

## 2022-07-31 LAB — COMPLETE METABOLIC PANEL WITH GFR
AG Ratio: 2 (calc) (ref 1.0–2.5)
ALT: 17 U/L (ref 6–29)
AST: 20 U/L (ref 10–35)
Albumin: 4.4 g/dL (ref 3.6–5.1)
Alkaline phosphatase (APISO): 47 U/L (ref 37–153)
BUN: 12 mg/dL (ref 7–25)
CO2: 27 mmol/L (ref 20–32)
Calcium: 9.4 mg/dL (ref 8.6–10.4)
Chloride: 106 mmol/L (ref 98–110)
Creat: 0.79 mg/dL (ref 0.50–1.05)
Globulin: 2.2 g/dL (calc) (ref 1.9–3.7)
Glucose, Bld: 75 mg/dL (ref 65–99)
Potassium: 4.3 mmol/L (ref 3.5–5.3)
Sodium: 140 mmol/L (ref 135–146)
Total Bilirubin: 0.5 mg/dL (ref 0.2–1.2)
Total Protein: 6.6 g/dL (ref 6.1–8.1)
eGFR: 86 mL/min/{1.73_m2} (ref >=60)

## 2022-07-31 LAB — CBC WITH DIFFERENTIAL/PLATELET
Absolute Monocytes: 257 cells/uL (ref 200–950)
Basophils Absolute: 32 cells/uL (ref 0–200)
Basophils Relative: 0.7 %
Eosinophils Absolute: 108 cells/uL (ref 15–500)
Eosinophils Relative: 2.4 %
HCT: 37.5 % (ref 35.0–45.0)
Hemoglobin: 12.9 g/dL (ref 11.7–15.5)
Lymphs Abs: 1409 cells/uL (ref 850–3900)
MCH: 28.9 pg (ref 27.0–33.0)
MCHC: 34.4 g/dL (ref 32.0–36.0)
MCV: 83.9 fL (ref 80.0–100.0)
MPV: 11.6 fL (ref 7.5–12.5)
Monocytes Relative: 5.7 %
Neutro Abs: 2696 cells/uL (ref 1500–7800)
Neutrophils Relative %: 59.9 %
Platelets: 292 10*3/uL (ref 140–400)
RBC: 4.47 10*6/uL (ref 3.80–5.10)
RDW: 12.4 % (ref 11.0–15.0)
Total Lymphocyte: 31.3 %
WBC: 4.5 10*3/uL (ref 3.8–10.8)

## 2022-07-31 LAB — ANA: Anti Nuclear Antibody (ANA): POSITIVE — AB

## 2022-07-31 LAB — RFLX DRVVT CONFRIM: DRVVT CONFIRM: POSITIVE — AB

## 2022-07-31 LAB — C3 AND C4
C3 Complement: 103 mg/dL (ref 83–193)
C4 Complement: 19 mg/dL (ref 15–57)

## 2022-07-31 LAB — CARDIOLIPIN ANTIBODIES, IGG, IGM, IGA
Anticardiolipin IgA: <2 APL-U/mL (ref <20.0)
Anticardiolipin IgG: <2 GPL-U/mL (ref <20.0)
Anticardiolipin IgM: 17.4 MPL-U/mL (ref <20.0)

## 2022-07-31 LAB — BETA-2 GLYCOPROTEIN ANTIBODIES
Beta-2 Glyco 1 IgA: <2 U/mL (ref <20.0)
Beta-2 Glyco 1 IgM: 23.5 U/mL — ABNORMAL HIGH (ref <20.0)
Beta-2 Glyco I IgG: 2.2 U/mL (ref <20.0)

## 2022-07-31 LAB — RFX DRVVT 1:1 MIX

## 2022-07-31 LAB — RHEUMATOID FACTOR: Rheumatoid fact SerPl-aCnc: <14 IU/mL (ref <14)

## 2022-07-31 LAB — PROTEIN / CREATININE RATIO, URINE
Creatinine, Urine: 127 mg/dL (ref 20–275)
Protein/Creat Ratio: 63 mg/g creat (ref 24–184)
Protein/Creatinine Ratio: 0.063 mg/mg creat (ref 0.024–0.184)
Total Protein, Urine: 8 mg/dL (ref 5–24)

## 2022-07-31 LAB — LUPUS ANTICOAGULANT EVAL W/ REFLEX
PTT-LA Screen: 41 s — ABNORMAL HIGH (ref <=40)
dRVVT: 47 s — ABNORMAL HIGH (ref <=45)

## 2022-07-31 LAB — LIPID PANEL
Cholesterol: 215 mg/dL — ABNORMAL HIGH (ref <200)
HDL: 82 mg/dL (ref >=50)
LDL Cholesterol (Calc): 108 mg/dL (calc) — ABNORMAL HIGH
Non-HDL Cholesterol (Calc): 133 mg/dL (calc) — ABNORMAL HIGH (ref <130)
Total CHOL/HDL Ratio: 2.6 (calc) (ref <5.0)
Triglycerides: 134 mg/dL (ref <150)

## 2022-07-31 LAB — ANTI-NUCLEAR AB-TITER (ANA TITER): ANA Titer 1: 1:80 {titer} — ABNORMAL HIGH

## 2022-07-31 LAB — ANTI-DNA ANTIBODY, DOUBLE-STRANDED: ds DNA Ab: 1 IU/mL

## 2022-07-31 LAB — THROMBIN CLOTTING TIME: Thrombin Clotting Time: 17 s (ref 13–19)

## 2022-07-31 LAB — C-REACTIVE PROTEIN: CRP: 5.5 mg/L (ref <8.0)

## 2022-07-31 LAB — SEDIMENTATION RATE: Sed Rate: 9 mm/h (ref 0–30)

## 2022-07-31 LAB — CYCLIC CITRUL PEPTIDE ANTIBODY, IGG: Cyclic Citrullin Peptide Ab: <16 UNITS

## 2022-07-31 LAB — VITAMIN D 25 HYDROXY (VIT D DEFICIENCY, FRACTURES): Vit D, 25-Hydroxy: 41 ng/mL (ref 30–100)

## 2022-07-31 NOTE — Progress Notes (Signed)
Beta-2 glycoprotein IgM remains borderline positive.  Anticardiolipin antibodies negative.  Lupus anticoagulant remains positive.    No changes in treatment recommended at this time.  Remain on plaquenil and aspirin.

## 2022-08-23 ENCOUNTER — Ambulatory Visit: Payer: 59 | Admitting: Physician Assistant

## 2022-08-23 ENCOUNTER — Encounter: Payer: Self-pay | Admitting: Physician Assistant

## 2022-08-23 VITALS — BP 100/60 | HR 56 | Temp 97.5°F | Ht 66.0 in | Wt 140.0 lb

## 2022-08-23 DIAGNOSIS — R051 Acute cough: Secondary | ICD-10-CM | POA: Diagnosis not present

## 2022-08-23 MED ORDER — DOXYCYCLINE HYCLATE 100 MG PO TABS: 100.0000 mg | ORAL_TABLET | Freq: Two times a day (BID) | ORAL | 0 refills | Status: DC

## 2022-08-23 NOTE — Patient Instructions (Signed)
It was great to see you!  Start doxycycline for your cough May take tessalon perles if needed for cough  If no improvement, or any other concerns, please let us know   Take care,  Inda Coke PA-C

## 2022-08-23 NOTE — Progress Notes (Signed)
Alexandra Garner is a 62 y.o. female here for a new problem.  History of Present Illness:   Chief Complaint  Patient presents with   Cough    Pt c/o cough since Monday, expectorating yellow sputum. Denies fever or chills. Has not taken anything    HPI  Cough Patient is complaining of a productive cough with yellow sputum with symptoms occurring Sunday. She expresses that it started as a sore throat. She reports that her husband has been sick recently. Both her and her husband have tested for Covid which came back negative. She does not manage symptoms with any OTC products -- she cannot tolerate them as they often cause palpitations. She denies changes in appetite, fever, chills, SOB, ear pain, and nasal congestion.  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 Janice Coffin   COLONOSCOPY  03/28/2008   PELVIC LAPAROSCOPY     REDUCTION MAMMAPLASTY      Family History  Problem Relation Age of Onset   Clotting disorder Mother    Prostate cancer Father    Colon polyps Father    Cervical cancer Maternal Grandmother    Healthy Daughter     Allergies  Allergen  Reactions   Penicillins Shortness Of Breath and Swelling    Other reaction(s): anaphylaxis/angioedema Mouth swells and it gets hard to breathe    Adhesive [Tape] Other (See Comments)    Other reaction(s): other/intolerance blisters blisters   Latex Hives    Current Medications:   Current Outpatient Medications:    acetaminophen (TYLENOL) 500 MG tablet, Take by mouth as needed. , Disp: , Rfl:    aspirin 81 MG tablet, Take 81 mg by mouth 2 (two) times daily. , Disp: , Rfl:    doxycycline (VIBRA-TABS) 100 MG tablet, Take 1 tablet (100 mg total) by mouth 2 (two) times daily., Disp: 14 tablet, Rfl: 0   estrogens, conjugated, (PREMARIN) 0.625 MG tablet, TAKE 1 TABLET BY MOUTH DAILY., Disp: 90 tablet, Rfl: 3   hydroxychloroquine (PLAQUENIL) 200 MG tablet, TAKE 1 TABLET BY MOUTH EVERY DAY, Disp: 90 tablet, Rfl: 0   ibuprofen (ADVIL) 400 MG tablet, Take by mouth as needed., Disp: , Rfl:    Multiple Vitamins-Minerals (PRESERVISION AREDS) CAPS, Take 1 tablet by mouth daily. , Disp: , Rfl:    Vitamin D, Cholecalciferol, 25 MCG (1000 UT) TABS, Take 2 tablets by mouth daily. , Disp: , Rfl:    Review of Systems:  Review of Systems  Constitutional:  Negative for chills and fever.  HENT:  Positive for sore throat. Negative for congestion (nasal) and ear pain.   Respiratory:  Positive for cough and sputum production (yellow). Negative for shortness of breath.     Vitals:   Vitals:   08/23/22 1128  BP: 100/60  Pulse: (!) 56  Temp: (!) 97.5 F (36.4 C)  TempSrc: Temporal  Weight: 140 lb (63.5 kg)  Height: '5\' 6"'$  (1.676 m)     Body mass index is 22.6 kg/m.  Physical Exam:   Physical Exam Vitals and nursing note reviewed.  Constitutional:      General: She is not in acute distress.    Appearance: Normal appearance. She is well-developed. She is not ill-appearing or toxic-appearing.  HENT:     Head: Normocephalic and atraumatic.     Right Ear: Tympanic membrane, ear canal and  external ear normal. Tympanic membrane is not erythematous, retracted or bulging.     Left Ear: Tympanic membrane, ear canal and external ear normal. Tympanic membrane is not erythematous, retracted or bulging.     Nose: Nose normal.     Right Sinus: No maxillary sinus tenderness or frontal sinus tenderness.     Left Sinus: No maxillary sinus tenderness or frontal sinus tenderness.     Mouth/Throat:     Pharynx: Uvula midline. No posterior oropharyngeal erythema.  Eyes:     General: Lids are normal.     Extraocular Movements: Extraocular movements intact.     Conjunctiva/sclera: Conjunctivae normal.     Pupils: Pupils are equal, round, and reactive to light.  Neck:     Trachea: Trachea normal.  Cardiovascular:     Rate and Rhythm: Normal rate and regular rhythm.     Heart sounds: Normal heart sounds, S1 normal and S2 normal. No murmur heard.    No gallop.  Pulmonary:     Effort: Pulmonary effort is normal. No respiratory distress.     Breath sounds: Normal breath sounds. No decreased breath sounds, wheezing, rhonchi or rales.  Lymphadenopathy:     Cervical: No cervical adenopathy.  Skin:    General: Skin is warm and dry.  Neurological:     Mental Status: She is alert and oriented to person, place, and time.  Psychiatric:        Speech: Speech normal.        Behavior: Behavior normal. Behavior is cooperative.        Judgment: Judgment normal.     Assessment and Plan:   Acute cough No red flags on exam.  Will initiate doxycycline per orders. She has tessalon perles at home that she plans to trial.  Discussed taking medications as prescribed. Reviewed return precautions including worsening fever, SOB, worsening cough or other concerns. Push fluids and rest. I recommend that patient follow-up if symptoms worsen or persist despite treatment x 7-10 days, sooner if needed.  I,Verona Buck,acting as a Education administrator for [employer], PA.,have documented all relevant documentation on the  behalf of Inda Coke, PA,as directed by  Inda Coke, PA while in the presence of Inda Coke, [place].  I, Inda Coke, [place], have reviewed all documentation for this visit. The documentation on 08/23/22 for the exam, diagnosis, procedures, and orders are all accurate and complete.  Inda Coke, PA-C

## 2022-08-26 ENCOUNTER — Telehealth: Payer: Self-pay | Admitting: Family Medicine

## 2022-08-26 NOTE — Telephone Encounter (Signed)
error 

## 2022-08-27 ENCOUNTER — Ambulatory Visit: Payer: 59 | Admitting: Family Medicine

## 2022-08-27 VITALS — BP 110/80 | HR 53 | Temp 98.3°F | Ht 66.0 in | Wt 138.4 lb

## 2022-08-27 DIAGNOSIS — J208 Acute bronchitis due to other specified organisms: Secondary | ICD-10-CM | POA: Diagnosis not present

## 2022-08-27 DIAGNOSIS — T887XXA Unspecified adverse effect of drug or medicament, initial encounter: Secondary | ICD-10-CM | POA: Diagnosis not present

## 2022-08-27 DIAGNOSIS — B9689 Other specified bacterial agents as the cause of diseases classified elsewhere: Secondary | ICD-10-CM | POA: Diagnosis not present

## 2022-08-27 NOTE — Progress Notes (Signed)
Subjective  CC:  Chief Complaint  Patient presents with   Cough    Pt stated that she was put on an anitbiotic on Friday(doxycycline) and it has been causing pain in her face.     HPI: Alexandra Garner is a 62 y.o. female who presents to the office today to address the problems listed above in the chief complaint. Reviewed notes from 3/1. Treated with doxy for bronchitis. Doing a bit better with lingering cough, however, 15 minutes after doxy dose gets moderate to severe facial pain: mainly around jaw. Lasts 2-4 hours and improved with tylenol. No fevers. No sinus pain.   Assessment  1. Acute bacterial bronchitis   2. Side effect of medication      Plan  bronchitis:  bacterial vs viral. Completed 5 days of doxy. Ok to stop abx and monitor. Supportive care: mucinex dm Side effect of doxy vs allergy? Stop and list as allergy.   Follow up: prn  Visit date not found  No orders of the defined types were placed in this encounter.  No orders of the defined types were placed in this encounter.     I reviewed the patients updated PMH, FH, and SocHx.    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    aspirin 81 MG tablet Take 81 mg by mouth 2 (two) times daily.    estrogens, conjugated, (PREMARIN) 0.625 MG tablet TAKE 1 TABLET BY MOUTH DAILY.   hydroxychloroquine (PLAQUENIL) 200 MG tablet TAKE 1 TABLET BY MOUTH EVERY DAY   ibuprofen (ADVIL) 400 MG tablet Take by mouth as needed.   Multiple Vitamins-Minerals (PRESERVISION AREDS) CAPS Take 1 tablet by mouth daily.    Vitamin D, Cholecalciferol, 25 MCG (1000 UT) TABS Take 2 tablets by mouth daily.    [DISCONTINUED] doxycycline (VIBRA-TABS) 100 MG tablet Take 1 tablet (100 mg total) by mouth 2 (two) times daily.    Allergies: Patient is allergic to penicillins, doxycycline, adhesive [tape], and latex. Family History: Patient family history includes Cervical cancer in her maternal grandmother; Clotting disorder in her mother; Colon polyps in her father; Healthy in her daughter; Prostate cancer in her father. Social History:  Patient  reports that she has never smoked. She has never been exposed to tobacco smoke. She has never used smokeless tobacco. She reports current alcohol use of about 2.0 - 3.0 standard drinks of alcohol per week. She reports that she does not use drugs.  Review of Systems: Constitutional: Negative for fever malaise or anorexia Cardiovascular: negative for chest pain Respiratory: negative for SOB or persistent cough Gastrointestinal: negative for abdominal pain  Objective  Vitals: BP 110/80   Pulse (!) 53   Temp 98.3 F (36.8 C)   Ht '5\' 6"'$  (1.676 m)   Wt 138 lb 6.4 oz (62.8 kg)   LMP 06/25/2003   SpO2 99%   BMI 22.34 kg/m  General: no acute distress , A&Ox3 HEENT: PEERL, conjunctiva normal, neck is supple Cardiovascular:  RRR without murmur or gallop.  Respiratory:  Good breath sounds bilaterally, CTAB with normal respiratory effort Skin:  Warm, no rashes  Commons side effects, risks,  benefits, and alternatives for medications and treatment plan prescribed today were discussed, and the patient expressed understanding of the given instructions. Patient is instructed to call or message via MyChart if he/she has any questions or concerns regarding our treatment plan. No barriers to understanding were identified. We discussed Red Flag symptoms and signs in detail. Patient expressed understanding regarding what to do in case of urgent or emergency type symptoms.  Medication list was reconciled, printed and provided to the patient in AVS. Patient instructions and summary information was reviewed with the patient as documented in the AVS. This note was prepared with assistance of Dragon voice recognition software. Occasional wrong-word or sound-a-like substitutions may have occurred due to the inherent limitations of voice recognition software

## 2022-08-28 ENCOUNTER — Other Ambulatory Visit: Payer: Self-pay | Admitting: Physician Assistant

## 2022-08-28 DIAGNOSIS — M359 Systemic involvement of connective tissue, unspecified: Secondary | ICD-10-CM

## 2022-08-28 NOTE — Telephone Encounter (Signed)
Next Visit: 01/08/2023  Last Visit: 07/24/2022  Labs: 07/24/2022 CBC and CMP WNL   Eye exam: 01/01/2022 WNL   Current Dose per office note 07/24/2022: Plaquenil 200 mg 1 tablet by mouth daily   [id number] disease   Last Fill: 05/31/2022  Okay to refill Plaquenil?

## 2022-11-30 ENCOUNTER — Other Ambulatory Visit: Payer: Self-pay | Admitting: Physician Assistant

## 2022-11-30 DIAGNOSIS — M359 Systemic involvement of connective tissue, unspecified: Secondary | ICD-10-CM

## 2022-12-02 NOTE — Telephone Encounter (Signed)
Last Fill: 08/28/2022  Eye exam: 01/01/2022 WNL    Labs: 07/24/2022 CBC and CMP WNL.   Next Visit: 01/08/2023  Last Visit: 07/24/2022  DX: Autoimmune disease   Current Dose per office note 07/24/2022: Plaquenil 200 mg 1 tablet by mouth daily   Okay to refill Plaquenil?

## 2022-12-30 NOTE — Progress Notes (Signed)
Office Visit Note  Patient: Alexandra Garner             Date of Birth: 17-Nov-1960           MRN: [id number]             PCP: Willow Ora, MD Referring: Willow Ora, MD Visit Date: 01/08/2023 Occupation: @GUAROCC @  Subjective:  Right hip pain and right foot pain  History of Present Illness: Alexandra Garner is a 62 y.o. female with autoimmune disease and osteoarthritis.  She states for the last 3 months she has been having pain and discomfort in her right great toe.  She is also experiencing nocturnal pain.  She has difficulty walking due to toe pain.  She continues to have discomfort in the right trochanteric region.  She states her IT band is tight and she does do stretches every day before and after walking.  She has been also using a foam roller to stretch the IT band.  She had a cortisone injection couple of years ago which was helpful.  She continues to take hydroxychloroquine 200 mg 1 tablet by mouth daily and aspirin 81 mg 2 tablets daily.  She denies any history of oral ulcers, nasal ulcers, malar rash, Raynaud's, lymphadenopathy.  She denies history of inflammatory arthritis.  She continues to have fatigue and photosensitivity.    Activities of Daily Living:  Patient reports morning stiffness for 15 minutes.   Patient Reports nocturnal pain.  Difficulty dressing/grooming: Denies Difficulty climbing stairs: Denies Difficulty getting out of chair: Denies Difficulty using hands for taps, buttons, cutlery, and/or writing: Denies  Review of Systems  Constitutional:  Positive for fatigue.  HENT:  Negative for mouth sores and mouth dryness.   Eyes:  Negative for dryness.  Respiratory:  Negative for shortness of breath.   Cardiovascular:  Negative for chest pain and palpitations.  Gastrointestinal:  Negative for blood in stool, constipation and diarrhea.  Endocrine: Negative for increased urination.  Genitourinary:  Negative for involuntary urination.  Musculoskeletal:   Positive for joint pain, joint pain, myalgias, morning stiffness and myalgias. Negative for gait problem, joint swelling, muscle weakness and muscle tenderness.  Skin:  Positive for sensitivity to sunlight. Negative for color change, rash and hair loss.  Allergic/Immunologic: Negative for susceptible to infections.  Neurological:  Negative for dizziness and headaches.  Hematological:  Negative for swollen glands.  Psychiatric/Behavioral:  Positive for sleep disturbance. Negative for depressed mood. The patient is not nervous/anxious.     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   Family history of macular degeneration 02/03/2015   Primary localized osteoarthrosis of hand 12/23/2013   History of colonic polyps 11/01/2008    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 Toy Baker   COLONOSCOPY  03/28/2008   PELVIC LAPAROSCOPY     REDUCTION MAMMAPLASTY     Social History   Social History Narrative   Not on file   Immunization History  Administered Date(s) Administered   COVID-19, mRNA, vaccine(Comirnaty)12 years and older 04/06/2022   PFIZER(Purple Top)SARS-COV-2 Vaccination 07/20/2019, 08/11/2019, 03/30/2020, 10/30/2020, 04/18/2022   Pfizer Covid-19 Vaccine Bivalent Booster 11yrs & up 04/01/2021   Tdap 10/01/2011, 05/02/2022   Zoster Recombinant(Shingrix) 04/15/2019, 06/17/2019     Objective: Vital Signs: BP 122/74 (BP Location: Left Arm, Patient Position: Sitting, Cuff Size: Normal)   Pulse (!) 58   Resp 15   Ht 5\' 6"  (1.676 m)   Wt 134 lb 6.4 oz (61 kg)   LMP 06/25/2003   BMI 21.69 kg/m    Physical Exam Vitals and nursing note reviewed.  Constitutional:      Appearance: She is well-developed.  HENT:     Head: Normocephalic and atraumatic.  Eyes:     Conjunctiva/sclera: Conjunctivae normal.  Cardiovascular:     Rate and Rhythm: Normal rate and regular rhythm.     Heart sounds: Normal heart sounds.  Pulmonary:     Effort: Pulmonary effort is normal.     Breath sounds: Normal breath sounds.  Abdominal:     General: Bowel sounds are normal.     Palpations: Abdomen is soft.  Musculoskeletal:     Cervical back: Normal range of motion.  Lymphadenopathy:     Cervical: No cervical adenopathy.  Skin:    General: Skin is warm and dry.     Capillary Refill: Capillary refill takes less than 2 seconds.  Neurological:     Mental Status: She is alert and oriented to person, place, and time.  Psychiatric:        Behavior: Behavior normal.      Musculoskeletal Exam: Cervical, thoracic and lumbar spine were in good range of motion.  Shoulder joints, elbow joints, wrist joints, MCPs PIPs and DIPs with good range of motion.  She had bilateral PIP and DIP thickening.  Hip joints and knee joints in good range of motion.  She had bilateral first  MTP thickening and mild valgus deformity was noted.  Callus was also noted under the left great toe.  CDAI Exam: CDAI Score: -- Patient Global: --; Provider Global: -- Swollen: --; Tender: -- Joint Exam 01/08/2023   No joint exam has been documented for this visit   There is currently no information documented on the homunculus. Go to the Rheumatology activity and complete the homunculus joint exam.  Investigation: No additional findings.  Imaging: US Guided Needle Placement  Result Date: 01/08/2023 Ultrasound guided injection is preferred based studies that show increased duration, increased effect, greater accuracy, decreased procedural pain, increased response rate, and decreased cost with ultrasound guided versus blind injection.   Verbal informed consent obtained.  Time-out conducted.  Noted no overlying erythema, induration, or other signs of local infection. Ultrasound-guided MTP injection: After sterile prep with Betadine,  injected 0.5 mL of 1% lidocaine and 20 mg Kenalog using a 27-gauge needle, in the right first MTP joint.    XR Foot 2 Views Right  Result Date: 01/08/2023 Severe first MTP narrowing was noted.  PIP and DIP narrowing was noted.  No intertarsal, tibiotalar or subtalar joint space narrowing was noted. Impression: These findings are consistent with osteoarthritis of the foot.   Recent Labs: Lab Results  Component Value Date   WBC 4.5 07/24/2022   HGB 12.9 07/24/2022   PLT 292 07/24/2022   NA 140 07/24/2022   K 4.3 07/24/2022   CL 106 07/24/2022   CO2 27 07/24/2022   GLUCOSE 75 07/24/2022   BUN 12 07/24/2022   CREATININE 0.79 07/24/2022   BILITOT 0.5 07/24/2022   ALKPHOS 33 (L) 01/10/2020   AST 20 07/24/2022   ALT 17 07/24/2022   PROT 6.6 07/24/2022   ALBUMIN 3.9 01/10/2020   CALCIUM 9.4 07/24/2022   GFRAA 99 08/29/2020    Speciality Comments: Plaquenil eye exam: normal on 01/01/2022 WNL [facility] Follow up in 1 year.  PLQ eye  exam scheduled for 01/2023 per patient  Procedures:  Large Joint Inj: R greater trochanter on 01/08/2023 8:42 AM Indications: pain Details: 27 G 1.5 in needle, lateral approach  Arthrogram: No  Medications: 40 mg triamcinolone acetonide 40 MG/ML; 1.5 mL lidocaine 1 % Aspirate: 0 mL Outcome: tolerated well, no immediate complications Procedure, treatment alternatives, risks and benefits explained, specific risks discussed. Consent was given by the patient. Immediately prior to procedure a time out was called to verify the correct patient, procedure, equipment, support staff and site/side marked as required. Patient was prepped and draped in the usual sterile fashion.    Small Joint Inj: R great MTP on 01/08/2023 8:42 AM Indications: pain Details: 27 G needle, ultrasound-guided dorsal approach  Spinal Needle: No  Medications: 0.5 mL lidocaine 1 %; 20 mg triamcinolone acetonide 40 MG/ML Aspirate: 0 mL Consent was given by the patient. Immediately prior to procedure a time out was called to verify the correct patient, procedure, equipment, support staff and site/side marked as required. Patient was prepped and draped in the usual sterile fashion.    Ultrasound guided injection is preferred based studies that show increased duration, increased effect, greater accuracy, decreased procedural pain, increased response rate, and decreased cost with ultrasound guided versus blind injection.   Verbal informed consent obtained.  Time-out conducted.  Noted no overlying erythema, induration, or other signs of local infection. Ultrasound-guided MTP injection: After sterile prep with Betadine, injected 0.5 mL of 1% lidocaine and 20 mg Kenalog using a 27-gauge needle, in the right first MTP joint.    Allergies: Penicillins, Doxycycline, Adhesive [tape], and Latex   Assessment / Plan:     Visit Diagnoses: Autoimmune disease (HCC) - History of arthralgias, [name], photosensitivity, positive aCL IgM,  positive beta-2 GP 1 IgM, positive lupus anticoagulant: -She continues to have fatigue and photosensitivity.  Patient denies any history of oral ulcers, nasal ulcers, malar rash, lymphadenopathy.  She has not had [name] symptoms during the summer months.  No sclerodactyly or nailbed capillary changes were noted.  I will obtain labs today.  Labs from January 2024 were reviewed which showed positive lupus anticoagulant.  Anticardiolipin antibodies were negative.  Beta-2 IgM GP 1 was low titer positive.  Complements and sed rate were normal.  Plan: Protein / creatinine ratio, urine, Anti-DNA antibody, double-stranded, C3 and C4, Sedimentation rate, ANA, Lupus Anticoagulant Eval w/Reflex, Beta-2  glycoprotein antibodies, Cardiolipin antibodies, IgG, IgM, IgA  High risk medication use - Plaquenil 200 mg 1 tablet by mouth daily.  CBC and CMP were stable and January 2024.- Plan: CBC with Differential/Platelet, COMPLETE METABOLIC PANEL WITH GFR  Raynaud's disease without gangrene-currently not active.  Keeping core temperature warm and warm clothing was advised as needed.  Positive cardiolipin antibodies, positive b2 GP1 - Anticardiolipin IgM low titer positive in the past and negative now, beta-2 GP 1 IgM low titer positive.  She is on aspirin 162 mg p.o. daily.  There is no history of arterial or venous thrombosis.  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  Primary osteoarthritis of both hands-she has osteoarthritis with bilateral PIP and DIP thickening but not symptomatic.  Trochanteric bursitis, right hip-she continues to have ongoing pain and discomfort over right trochanteric bursa.  She gives history of nocturnal pain and discomfort walking.  She has been doing the stretches on a regular basis.  After different treatment options were discussed and side effects were reviewed right trochanteric bursa was injected with lidocaine and Kenalog as described above.  She tolerated  the procedure well.  Postprocedure instructions were given.  Primary osteoarthritis of both knees-current  Pain in right foot-she has been having progressively increasing pain in her right first MTP joint especially after walking.  She walks on a regular basis.  Thickening of the bilateral first MTP joints with a callus under the left first MTP joint was noted.  X-rays of the right foot showed hallux rigidus and valgus deformity.  X-ray findings were reviewed with the patient.  Wide fitting shoes and good support in the shoes was discussed.  Patient has been experiencing severe pain.  Per patient's request after informed consent was obtained right first MTP was injected with lidocaine and cortisone under ultrasound guidance as described above.  Patient tolerated the procedure well.  Postprocedure instructions were given.  Primary osteoarthritis of both feet-she has arthritis involving bilateral first MTP joints with hallux rigidus and valgus deformity.  I will refer her to podiatry.  DDD (degenerative disc disease), cervical -currently not symptomatic.  She had good mobility in the cervical spine.  MRI in the past showed multilevel spondylosis and facet joint arthropathy.  DDD (degenerative disc disease), lumbar -she denies any discomfort today.  MRI lumbar spine on 05/12/19-L5-S1 disc bulge and mild to moderate neural foraminal narrowing.  Osteopenia of multiple sites - DEXA 07/25/2020 The BMD measured at [facility] L1-L2 is 0.962 g/cm2 with a T-score of -1.7.  She has been taking calcium.  Vitamin D deficiency-she takes vitamin D 2000 units daily.  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  Patient states that her oncologist discharged her.  She will return only on as needed basis.  History of colonic polyps  History of migraine  Tinnitus, bilateral  Dyslipidemia -she has elevated LDL.  She came to get fasting labs today.  Plan: Lipid panel  Orders: Orders Placed This Encounter   Procedures   Large Joint Inj   Small Joint Inj   XR Foot 2 Views Right   US Guided Needle Placement   Protein / creatinine ratio, urine   CBC with Differential/Platelet   COMPLETE METABOLIC PANEL WITH GFR   Anti-DNA antibody, double-stranded   C3 and C4   Sedimentation rate   ANA   Lupus Anticoagulant Eval w/Reflex   Lipid panel   Beta-2 glycoprotein antibodies   Cardiolipin antibodies, IgG, IgM, IgA   Protein / creatinine  ratio, urine   CBC with Differential/Platelet   COMPLETE METABOLIC PANEL WITH GFR   ANA   Anti-DNA antibody, double-stranded   C3 and C4   Sedimentation rate   Beta-2 glycoprotein antibodies   Cardiolipin antibodies, IgG, IgM, IgA   Lupus Anticoagulant Eval w/Reflex   No orders of the defined types were placed in this encounter.   Face-to-face time spent with patient was 40 minutes. Greater than 50% of time was spent in counseling and coordination of care.  Follow-Up Instructions: Return in about 5 months (around 06/10/2023) for Autoimmune disease.   Pollyann Savoy, MD  Note - This record has been created using Animal nutritionist.  Chart creation errors have been sought, but may not always  have been located. Such creation errors do not reflect on  the standard of medical care.

## 2023-01-08 ENCOUNTER — Ambulatory Visit (INDEPENDENT_AMBULATORY_CARE_PROVIDER_SITE_OTHER): Payer: 59

## 2023-01-08 ENCOUNTER — Ambulatory Visit: Payer: 59 | Attending: Rheumatology | Admitting: Rheumatology

## 2023-01-08 ENCOUNTER — Encounter: Payer: Self-pay | Admitting: Rheumatology

## 2023-01-08 VITALS — BP 122/74 | HR 58 | Resp 15 | Ht 66.0 in | Wt 134.4 lb

## 2023-01-08 DIAGNOSIS — M79671 Pain in right foot: Secondary | ICD-10-CM

## 2023-01-08 DIAGNOSIS — E559 Vitamin D deficiency, unspecified: Secondary | ICD-10-CM

## 2023-01-08 DIAGNOSIS — I7300 Raynaud's syndrome without gangrene: Secondary | ICD-10-CM

## 2023-01-08 DIAGNOSIS — M5030 Other cervical disc degeneration, unspecified cervical region: Secondary | ICD-10-CM

## 2023-01-08 DIAGNOSIS — D219 Benign neoplasm of connective and other soft tissue, unspecified: Secondary | ICD-10-CM

## 2023-01-08 DIAGNOSIS — M19071 Primary osteoarthritis, right ankle and foot: Secondary | ICD-10-CM

## 2023-01-08 DIAGNOSIS — M8589 Other specified disorders of bone density and structure, multiple sites: Secondary | ICD-10-CM

## 2023-01-08 DIAGNOSIS — R760 Raised antibody titer: Secondary | ICD-10-CM | POA: Diagnosis not present

## 2023-01-08 DIAGNOSIS — M5136 Other intervertebral disc degeneration, lumbar region: Secondary | ICD-10-CM

## 2023-01-08 DIAGNOSIS — M19072 Primary osteoarthritis, left ankle and foot: Secondary | ICD-10-CM

## 2023-01-08 DIAGNOSIS — Z79899 Other long term (current) drug therapy: Secondary | ICD-10-CM

## 2023-01-08 DIAGNOSIS — M7061 Trochanteric bursitis, right hip: Secondary | ICD-10-CM | POA: Diagnosis not present

## 2023-01-08 DIAGNOSIS — M19042 Primary osteoarthritis, left hand: Secondary | ICD-10-CM

## 2023-01-08 DIAGNOSIS — M359 Systemic involvement of connective tissue, unspecified: Secondary | ICD-10-CM

## 2023-01-08 DIAGNOSIS — M19041 Primary osteoarthritis, right hand: Secondary | ICD-10-CM

## 2023-01-08 DIAGNOSIS — M170 Bilateral primary osteoarthritis of knee: Secondary | ICD-10-CM

## 2023-01-08 DIAGNOSIS — E785 Hyperlipidemia, unspecified: Secondary | ICD-10-CM

## 2023-01-08 DIAGNOSIS — Z86010 Personal history of colonic polyps: Secondary | ICD-10-CM

## 2023-01-08 DIAGNOSIS — Z8669 Personal history of other diseases of the nervous system and sense organs: Secondary | ICD-10-CM

## 2023-01-08 DIAGNOSIS — H9313 Tinnitus, bilateral: Secondary | ICD-10-CM

## 2023-01-08 MED ORDER — LIDOCAINE HCL 1 % IJ SOLN
1.5000 mL | INTRAMUSCULAR | Status: AC | PRN
Start: 2023-01-08 — End: 2023-01-08
  Administered 2023-01-08: 1.5 mL

## 2023-01-08 MED ORDER — TRIAMCINOLONE ACETONIDE 40 MG/ML IJ SUSP
20.0000 mg | INTRAMUSCULAR | Status: AC | PRN
Start: 2023-01-08 — End: 2023-01-08
  Administered 2023-01-08: 20 mg via INTRA_ARTICULAR

## 2023-01-08 MED ORDER — TRIAMCINOLONE ACETONIDE 40 MG/ML IJ SUSP
40.0000 mg | INTRAMUSCULAR | Status: AC | PRN
Start: 2023-01-08 — End: 2023-01-08
  Administered 2023-01-08: 40 mg via INTRA_ARTICULAR

## 2023-01-08 MED ORDER — LIDOCAINE HCL 1 % IJ SOLN
0.5000 mL | INTRAMUSCULAR | Status: AC | PRN
Start: 2023-01-08 — End: 2023-01-08
  Administered 2023-01-08: .5 mL

## 2023-01-08 NOTE — Patient Instructions (Signed)
Standing Labs We placed an order today for your standing lab work.   Please have your standing labs drawn in January  Please have your labs drawn 2 weeks prior to your appointment so that the provider can discuss your lab results at your appointment, if possible.  Please note that you may see your imaging and lab results in MyChart before we have reviewed them. We will contact you once all results are reviewed. Please allow our office up to 72 hours to thoroughly review all of the results before contacting the office for clarification of your results.  WALK-IN LAB HOURS  Monday through Thursday from 8:00 am -12:30 pm and 1:00 pm-5:00 pm and Friday from 8:00 am-12:00 pm.  Patients with office visits requiring labs will be seen before walk-in labs.  You may encounter longer than normal wait times. Please allow additional time. Wait times may be shorter on  Monday and Thursday afternoons.  We do not book appointments for walk-in labs. We appreciate your patience and understanding with our staff.   Labs are drawn by [facility]. Please bring your co-pay at the time of your lab draw.  You may receive a bill from [facility] for your lab work.  Please note if you are on Hydroxychloroquine and and an order has been placed for a Hydroxychloroquine level,  you will need to have it drawn 4 hours or more after your last dose.  If you wish to have your labs drawn at another location, please call the office 24 hours in advance so we can fax the orders.  The office is located at [address], [place], [place] [address]   If you have any questions regarding directions or hours of operation,  please call [phone].   As a reminder, please drink plenty of water prior to coming for your lab work. Thanks!

## 2023-01-08 NOTE — Addendum Note (Signed)
Addended by: Audrie Lia on: 01/08/2023 09:55 AM   Modules accepted: Orders

## 2023-01-09 LAB — CBC WITH DIFFERENTIAL/PLATELET
Absolute Monocytes: 325 cells/uL (ref 200–950)
Eosinophils Absolute: 112 cells/uL (ref 15–500)
HCT: 38.9 % (ref 35.0–45.0)
MCH: 28.6 pg (ref 27.0–33.0)
MCHC: 33.4 g/dL (ref 32.0–36.0)
MCV: 85.7 fL (ref 80.0–100.0)
Total Lymphocyte: 24.7 %

## 2023-01-09 LAB — ANTI-DNA ANTIBODY, DOUBLE-STRANDED: ds DNA Ab: <1 IU/mL

## 2023-01-09 LAB — C3 AND C4: C3 Complement: 122 mg/dL (ref 83–193)

## 2023-01-09 LAB — LIPID PANEL
LDL Cholesterol (Calc): 111 mg/dL (calc) — ABNORMAL HIGH
Non-HDL Cholesterol (Calc): 139 mg/dL (calc) — ABNORMAL HIGH (ref <130)

## 2023-01-09 LAB — COMPLETE METABOLIC PANEL WITH GFR
ALT: 16 U/L (ref 6–29)
Albumin: 4.4 g/dL (ref 3.6–5.1)
Creat: 0.74 mg/dL (ref 0.50–1.05)
Glucose, Bld: 87 mg/dL (ref 65–99)
Potassium: 4.4 mmol/L (ref 3.5–5.3)
Total Protein: 6.8 g/dL (ref 6.1–8.1)

## 2023-01-09 LAB — PROTEIN / CREATININE RATIO, URINE: Protein/Creatinine Ratio: 0.068 mg/mg creat (ref 0.024–0.184)

## 2023-01-09 LAB — SEDIMENTATION RATE: Sed Rate: 11 mm/h (ref 0–30)

## 2023-01-11 LAB — LIPID PANEL
Cholesterol: 222 mg/dL — ABNORMAL HIGH (ref <200)
HDL: 83 mg/dL (ref >=50)

## 2023-01-11 LAB — COMPLETE METABOLIC PANEL WITH GFR
AG Ratio: 1.8 (calc) (ref 1.0–2.5)
CO2: 27 mmol/L (ref 20–32)
Globulin: 2.4 g/dL (calc) (ref 1.9–3.7)
Sodium: 139 mmol/L (ref 135–146)
Total Bilirubin: 0.5 mg/dL (ref 0.2–1.2)
eGFR: 93 mL/min/{1.73_m2} (ref >=60)

## 2023-01-11 LAB — CBC WITH DIFFERENTIAL/PLATELET
Eosinophils Relative: 2 %
Hemoglobin: 13 g/dL (ref 11.7–15.5)
MPV: 11.1 fL (ref 7.5–12.5)
Monocytes Relative: 5.8 %
RDW: 12.7 % (ref 11.0–15.0)

## 2023-01-11 LAB — RFLX HEXAGONAL PHASE CONFIRM: Hexagonal Phase Conf: POSITIVE — AB

## 2023-01-11 LAB — RFX DRVVT 1:1 MIX

## 2023-01-11 LAB — C3 AND C4: C4 Complement: 18 mg/dL (ref 15–57)

## 2023-01-11 LAB — PROTEIN / CREATININE RATIO, URINE: Total Protein, Urine: 13 mg/dL (ref 5–24)

## 2023-01-11 LAB — THROMBIN CLOTTING TIME: Thrombin Clotting Time: 16 s (ref 13–19)

## 2023-01-11 LAB — ANA: Anti Nuclear Antibody (ANA): NEGATIVE

## 2023-01-11 NOTE — Progress Notes (Signed)
Lupus anticoagulant remains positive, ANA negative, double-stranded DNA negative, complements normal, urine protein creatinine ratio normal, CBC normal, CMP normal, sed rate normal.  Labs do not indicate an autoimmune disease flare.  Lipid panel shows elevated LDL and triglycerides.  Please forward results to her PCP.

## 2023-01-12 LAB — CBC WITH DIFFERENTIAL/PLATELET
Basophils Absolute: 28 cells/uL (ref 0–200)
Basophils Relative: 0.5 %
Lymphs Abs: 1383 cells/uL (ref 850–3900)
Neutro Abs: 3752 cells/uL (ref 1500–7800)
Neutrophils Relative %: 67 %
Platelets: 287 10*3/uL (ref 140–400)
RBC: 4.54 10*6/uL (ref 3.80–5.10)
WBC: 5.6 10*3/uL (ref 3.8–10.8)

## 2023-01-12 LAB — PROTEIN / CREATININE RATIO, URINE
Creatinine, Urine: 192 mg/dL (ref 20–275)
Protein/Creat Ratio: 68 mg/g creat (ref 24–184)

## 2023-01-12 LAB — CARDIOLIPIN ANTIBODIES, IGG, IGM, IGA
Anticardiolipin IgA: <2 APL-U/mL (ref <20.0)
Anticardiolipin IgG: 2.1 GPL-U/mL (ref <20.0)
Anticardiolipin IgM: 18.5 MPL-U/mL (ref <20.0)

## 2023-01-12 LAB — LUPUS ANTICOAGULANT EVAL W/ REFLEX
PTT-LA Screen: 45 s — ABNORMAL HIGH (ref <=40)
dRVVT: 47 s — ABNORMAL HIGH (ref <=45)

## 2023-01-12 LAB — BETA-2 GLYCOPROTEIN ANTIBODIES
Beta-2 Glyco 1 IgA: <2 U/mL (ref <20.0)
Beta-2 Glyco 1 IgM: 23.1 U/mL — ABNORMAL HIGH (ref <20.0)
Beta-2 Glyco I IgG: 2.7 U/mL (ref <20.0)

## 2023-01-12 LAB — COMPLETE METABOLIC PANEL WITH GFR
AST: 20 U/L (ref 10–35)
Alkaline phosphatase (APISO): 44 U/L (ref 37–153)
BUN: 9 mg/dL (ref 7–25)
Calcium: 9.6 mg/dL (ref 8.6–10.4)
Chloride: 104 mmol/L (ref 98–110)

## 2023-01-12 LAB — LIPID PANEL
Total CHOL/HDL Ratio: 2.7 (calc) (ref <5.0)
Triglycerides: 163 mg/dL — ABNORMAL HIGH (ref <150)

## 2023-01-12 LAB — RFLX DRVVT CONFRIM: DRVVT CONFIRM: POSITIVE — AB

## 2023-01-21 ENCOUNTER — Encounter: Payer: Self-pay | Admitting: Family Medicine

## 2023-01-22 ENCOUNTER — Ambulatory Visit: Payer: 59 | Admitting: Family Medicine

## 2023-01-22 ENCOUNTER — Encounter: Payer: Self-pay | Admitting: Family Medicine

## 2023-01-22 VITALS — BP 130/74 | HR 57 | Temp 97.9°F | Ht 66.0 in | Wt 132.8 lb

## 2023-01-22 DIAGNOSIS — R7989 Other specified abnormal findings of blood chemistry: Secondary | ICD-10-CM | POA: Diagnosis not present

## 2023-01-22 NOTE — Progress Notes (Signed)
Subjective  CC:  Chief Complaint  Patient presents with   Abnormal Lab    HPI: Alexandra Garner is a 62 y.o. female who presents to the office today to address the problems listed above in the chief complaint. Had lipid panel drawn at rheum. Here for review. See below.  CVR factors: + antidariolipids on ASA, HRT but otherwise no risk factors  Healthy lifestyle; diet and exercises regularly.  Feels well overall.   Lab Results  Component Value Date   CHOL 222 (H) 01/08/2023   CHOL 215 (H) 07/24/2022   CHOL 178 01/10/2020   Lab Results  Component Value Date   HDL 83 01/08/2023   HDL 82 07/24/2022   HDL 71.00 01/10/2020   Lab Results  Component Value Date   LDLCALC 111 (H) 01/08/2023   LDLCALC 108 (H) 07/24/2022   LDLCALC 85 01/10/2020   Lab Results  Component Value Date   TRIG 163 (H) 01/08/2023   TRIG 134 07/24/2022   TRIG 107.0 01/10/2020   Lab Results  Component Value Date   CHOLHDL 2.7 01/08/2023   CHOLHDL 2.6 07/24/2022   CHOLHDL 3 01/10/2020   The 10-year ASCVD risk score (Arnett DK, et al., 2019) is: 2.8%   Values used to calculate the score:     Age: 25 years     Sex: Female     Is Non-Hispanic African American: No     Diabetic: No     Tobacco smoker: No     Systolic Blood Pressure: 130 mmHg     Is BP treated: No     HDL Cholesterol: 83 mg/dL     Total Cholesterol: 222 mg/dL    Assessment  1. High serum high density lipoprotein (HDL)      Plan  Review lipids:  has excellent HDL and fairly good cholesterol reading. No ischemic heart disease. Continue healthy lifestyle and reassured. Discussed options of NMR lipoprofile next round to ensure she is low risk. Pt agrees.   Follow up: next year for cpe Visit date not found  No orders of the defined types were placed in this encounter.  No orders of the defined types were placed in this encounter.     I reviewed the patients updated PMH, FH, and SocHx.    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    aspirin 81 MG tablet Take 81 mg by mouth 2 (two) times daily.    estrogens, conjugated, (  PREMARIN) 0.625 MG tablet TAKE 1 TABLET BY MOUTH DAILY.   hydroxychloroquine (PLAQUENIL) 200 MG tablet TAKE 1 TABLET BY MOUTH EVERY DAY   ibuprofen (ADVIL) 400 MG tablet Take by mouth as needed.   Multiple Vitamins-Minerals (PRESERVISION AREDS) CAPS Take 1 tablet by mouth daily.    Vitamin D, Cholecalciferol, 25 MCG (1000 UT) TABS Take 2 tablets by mouth daily.     Allergies: Patient is allergic to penicillins, doxycycline, adhesive [tape], and latex. Family History: Patient family history includes Cervical cancer in her maternal grandmother; Clotting disorder in her mother; Colon polyps in her father; Healthy in her daughter; Prostate cancer in her father. Social History:  Patient  reports that she has never smoked. She has never been exposed to tobacco smoke. She has never used smokeless tobacco. She reports current alcohol use of about 2.0 - 3.0 standard drinks of alcohol per week.  She reports that she does not use drugs.  Review of Systems: Constitutional: Negative for fever malaise or anorexia Cardiovascular: negative for chest pain Respiratory: negative for SOB or persistent cough Gastrointestinal: negative for abdominal pain  Objective  Vitals: BP 130/74   Pulse (!) 57   Temp 97.9 F (36.6 C)   Ht 5\' 6"  (1.676 m)   Wt 132 lb 12.8 oz (60.2 kg)   LMP 06/25/2003   SpO2 98%   BMI 21.43 kg/m  General: no acute distress , A&Ox3   Commons side effects, risks, benefits, and alternatives for medications and treatment plan prescribed today were discussed, and the patient expressed understanding of the given instructions. Patient is instructed to call or message via MyChart if he/she has any questions or concerns regarding our treatment plan. No barriers to understanding were identified. We discussed Red Flag symptoms and signs in detail. Patient expressed understanding regarding what to do in case of urgent or emergency type symptoms.  Medication list was reconciled, printed and provided to the patient in AVS. Patient instructions and summary information was reviewed with the patient as documented in the AVS. This note was prepared with assistance of Dragon voice recognition software. Occasional wrong-word or sound-a-like substitutions may have occurred due to the inherent limitations of voice recognition software

## 2023-02-07 ENCOUNTER — Ambulatory Visit: Payer: 59 | Admitting: Podiatry

## 2023-02-25 ENCOUNTER — Other Ambulatory Visit: Payer: Self-pay | Admitting: Physician Assistant

## 2023-02-25 DIAGNOSIS — M359 Systemic involvement of connective tissue, unspecified: Secondary | ICD-10-CM

## 2023-02-25 NOTE — Telephone Encounter (Signed)
Last Fill: 12/02/2022  Eye exam: 01/01/2022 WNL    Labs: 01/08/2023 CBC normal, CMP normal,   Next Visit: 06/10/2023  Last Visit: 01/08/2023  DX:Autoimmune disease   Current Dose per office note 01/08/2023: Plaquenil 200 mg 1 tablet by mouth daily   Okay to refill Plaquenil?

## 2023-05-05 ENCOUNTER — Ambulatory Visit (HOSPITAL_BASED_OUTPATIENT_CLINIC_OR_DEPARTMENT_OTHER): Payer: 59 | Admitting: Obstetrics & Gynecology

## 2023-05-12 ENCOUNTER — Ambulatory Visit (HOSPITAL_BASED_OUTPATIENT_CLINIC_OR_DEPARTMENT_OTHER): Payer: 59 | Admitting: Obstetrics & Gynecology

## 2023-05-14 ENCOUNTER — Encounter (HOSPITAL_BASED_OUTPATIENT_CLINIC_OR_DEPARTMENT_OTHER): Payer: Self-pay | Admitting: Obstetrics & Gynecology

## 2023-05-14 ENCOUNTER — Ambulatory Visit (HOSPITAL_BASED_OUTPATIENT_CLINIC_OR_DEPARTMENT_OTHER): Payer: 59 | Admitting: Obstetrics & Gynecology

## 2023-05-14 VITALS — BP 130/82 | HR 52 | Ht 66.25 in | Wt 137.8 lb

## 2023-05-14 DIAGNOSIS — G4709 Other insomnia: Secondary | ICD-10-CM

## 2023-05-14 DIAGNOSIS — Z79890 Hormone replacement therapy (postmenopausal): Secondary | ICD-10-CM | POA: Diagnosis not present

## 2023-05-14 DIAGNOSIS — R760 Raised antibody titer: Secondary | ICD-10-CM

## 2023-05-14 DIAGNOSIS — Z01419 Encounter for gynecological examination (general) (routine) without abnormal findings: Secondary | ICD-10-CM | POA: Diagnosis not present

## 2023-05-14 DIAGNOSIS — Z9229 Personal history of other drug therapy: Secondary | ICD-10-CM | POA: Diagnosis not present

## 2023-05-14 MED ORDER — ESTROGENS CONJUGATED 0.625 MG PO TABS
ORAL_TABLET | ORAL | 3 refills | Status: DC
Start: 2023-05-14 — End: 2023-12-11

## 2023-05-14 MED ORDER — TRAZODONE HCL 50 MG PO TABS: 50.0000 mg | ORAL_TABLET | Freq: Every evening | ORAL | 1 refills | Status: DC | PRN

## 2023-05-14 NOTE — Progress Notes (Signed)
62 y.o. G1P1 Married White or Caucasian female here for annual exam.  Doing well.  Denies vaginal bleeding.  On HRT.  Desires to continue.  Having some issue with insomnia.  Gets to sleep but then wakes up and cannot fall back asleep.  Family doing well.   Patient's last menstrual period was 06/25/2003.          Sexually active: Yes.    Smoker:  non smoker  Health Maintenance: Pap:  not indicated History of abnormal Pap:  no MMG:  04/2022 Colonoscopy:  2020, follow up 10 years BMD:   2022, -1.7  Screening Labs: Dr. Mardelle Matte   reports that she has never smoked. She has never been exposed to tobacco smoke. She has never used smokeless tobacco. She reports current alcohol use of about 2.0 - 3.0 standard drinks of alcohol per week. She reports that she does not use drugs.  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 Toy Baker   COLONOSCOPY  03/28/2008   PELVIC LAPAROSCOPY     REDUCTION MAMMAPLASTY      Current Outpatient Medications  Medication Sig Dispense Refill   acetaminophen (TYLENOL) 500 MG tablet Take by mouth as needed.      aspirin 81 MG tablet Take 81 mg by mouth 2 (two) times daily.      hydroxychloroquine (PLAQUENIL) 200 MG tablet TAKE 1 TABLET BY MOUTH EVERY DAY 90 tablet 0   ibuprofen (ADVIL) 400 MG tablet Take by mouth as needed.     Multiple Vitamins-Minerals (PRESERVISION AREDS) CAPS Take 1 tablet by mouth daily.      Vitamin D, Cholecalciferol, 25 MCG (1000 UT) TABS Take 2  tablets by mouth daily.      estrogens, conjugated, (PREMARIN) 0.625 MG tablet TAKE 1 TABLET BY MOUTH DAILY. 90 tablet 3   No current facility-administered medications for this visit.    Family History  Problem Relation Age of Onset   Clotting disorder Mother    Prostate cancer Father    Colon polyps Father    Cervical cancer Maternal Grandmother    Healthy Daughter     ROS: Constitutional: negative Genitourinary:negative  Exam:   BP 130/82   Pulse (!) 52   Ht 5' 6.25" (1.683 m)   Wt 137 lb 12.8 oz (62.5 kg)   LMP 06/25/2003   BMI 22.07 kg/m   Height: 5' 6.25" (168.3 cm)  General appearance: alert, cooperative and appears stated age Head: Normocephalic, without obvious abnormality, atraumatic Neck: no adenopathy, supple, symmetrical, trachea midline and thyroid normal to inspection and palpation Lungs: clear to auscultation bilaterally Breasts: normal appearance, no masses or tenderness Heart: regular rate and rhythm Abdomen: soft, non-tender; bowel sounds normal; no masses,  no organomegaly Extremities: extremities normal, atraumatic, no cyanosis or edema Skin: Skin color, texture, turgor normal. No rashes or lesions Lymph nodes: Cervical, supraclavicular, and axillary nodes normal. No abnormal inguinal nodes  palpated Neurologic: Grossly normal   Pelvic: External genitalia:  no lesions              Urethra:  normal appearing urethra with no masses, tenderness or lesions              Bartholins and Skenes: normal                 Vagina: normal appearing vagina with normal color and no discharge, no lesions              Cervix: absent              Pap taken: No. Bimanual Exam:  Uterus:  uterus absent              Adnexa: no mass, fullness, tenderness               Rectovaginal: Confirms               Anus:  normal sphincter tone, no lesions  Chaperone, Ina Homes, CMA, was present for exam.  Assessment/Plan: 1. Well woman exam with routine gynecological  exam - Pap smear not indicated - Mammogram 04/2022 - Colonoscopy 2020, follow up 10 years - Bone mineral density 2022 with osteopenia - lab work done with PCP, Dr. Mardelle Matte - vaccines reviewed/updated  2. Hormone replacement therapy (HRT) - continue current dosage - estrogens, conjugated, (PREMARIN) 0.625 MG tablet; TAKE 1 TABLET BY MOUTH DAILY.  Dispense: 90 tablet; Refill: 3  3. History of postmenopausal HRT  4. Other insomnia -options discussed. Will try trazodone 50mg .  Dosages discussed. - traZODone (DESYREL) 50 MG tablet; Take 1 tablet (50 mg total) by mouth at bedtime as needed for sleep.  Dispense: 30 tablet; Refill: 1  5. Antiphospholipid antibody positive - followed by Dr. Corliss Skains and Dr. Bertis Ruddy.  Also saw Dr. Myna Hidalgo for consultation as had been on HRT prior to having positive APL testing.  She desired to stay on HRT and Dr. Myna Hidalgo felt this was reasonable.  I desired consult before continuing HRT.

## 2023-05-27 NOTE — Progress Notes (Signed)
Office Visit Note  Patient: Alexandra Garner             Date of Birth: [birth year]           MRN: [id number]             PCP: Willow Ora, MD Referring: Willow Ora, MD Visit Date: 06/10/2023 Occupation: @GUAROCC @  Subjective:  Discuss lab results   History of Present Illness: Alexandra Garner is a 62 y.o. female with history of autoimmune disease and osteoarthritis.  Patient remains on Plaquenil 200 mg 1 tablet by mouth daily.  She is tolerating Plaquenil without any side effects and has not had any recent gaps in therapy.  She denies any signs or symptoms of an autoimmune disease flare since her last office visit.  She experiences intermittent arthralgias but has remained active exercising on a regular basis.  She denies any joint swelling at this time.  She has not had any recent rashes, photosensitivity, recent Raynaud's phenomenon.  Patient states that she has noticed easy bruising while taking aspirin 162 mg daily.  Patient had updated lab work on 06/04/2023 at which time she was advised to reduce aspirin to 81 mg daily which she is planning to do so.  She denies any new medical conditions.  She denies any recent or recurrent infections.     Activities of Daily Living:  Patient reports morning stiffness for 15-20 minutes.   Patient Reports nocturnal pain.  Difficulty dressing/grooming: Denies Difficulty climbing stairs: Denies Difficulty getting out of chair: Denies Difficulty using hands for taps, buttons, cutlery, and/or writing: Reports  Review of Systems  Constitutional:  Positive for fatigue.  HENT:  Positive for nose dryness. Negative for mouth sores and mouth dryness.   Eyes:  Negative for pain and dryness.  Respiratory:  Negative for cough, shortness of breath and wheezing.   Cardiovascular:  Negative for chest pain and palpitations.  Gastrointestinal:  Negative for blood in stool, constipation and diarrhea.  Endocrine: Negative for increased urination.   Genitourinary:  Negative for involuntary urination.  Musculoskeletal:  Positive for joint pain, joint pain, myalgias, muscle weakness, morning stiffness, muscle tenderness and myalgias. Negative for gait problem and joint swelling.  Skin:  Positive for sensitivity to sunlight. Negative for color change, rash and hair loss.  Allergic/Immunologic: Negative for susceptible to infections.  Neurological:  Negative for dizziness and headaches.  Hematological:  Negative for swollen glands.  Psychiatric/Behavioral:  Positive for sleep disturbance. Negative for depressed mood. The patient is not nervous/anxious.     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   Family history of macular degeneration 02/03/2015   Primary localized osteoarthrosis of hand 12/23/2013   History of colonic polyps 11/01/2008    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 Toy Baker   COLONOSCOPY  03/28/2008   PELVIC LAPAROSCOPY     REDUCTION MAMMAPLASTY     Social History   Social History Narrative   Not on file   Immunization History  Administered Date(s) Administered   PFIZER(Purple Top)SARS-COV-2 Vaccination 07/20/2019, 08/11/2019, 03/30/2020, 10/30/2020, 04/18/2022   Pfizer Covid-19 Vaccine Bivalent Booster 71yrs & up 04/01/2021   Pfizer(Comirnaty)Fall Seasonal Vaccine 12 years and older 04/06/2022   Tdap 10/01/2011, 05/02/2022   Zoster Recombinant(Shingrix) 04/15/2019, 06/17/2019     Objective: Vital Signs: BP 138/78 (BP Location: Left Arm, Patient Position: Sitting, Cuff Size: Normal)   Pulse (!) 54   Resp 15   Ht 5\' 6"  (1.676 m)   Wt 136 lb 12.8 oz (62.1 kg)   LMP 06/25/2003   BMI 22.08 kg/m    Physical Exam Vitals and nursing note reviewed.  Constitutional:      Appearance: She is well-developed.  HENT:     Head: Normocephalic and atraumatic.  Eyes:     Conjunctiva/sclera: Conjunctivae normal.  Cardiovascular:     Rate and Rhythm: Normal rate and regular rhythm.     Heart sounds: Normal heart sounds.  Pulmonary:     Effort: Pulmonary effort is normal.     Breath sounds: Normal breath sounds.  Abdominal:     General: Bowel sounds are normal.     Palpations: Abdomen is soft.  Musculoskeletal:     Cervical back: Normal range of motion.  Lymphadenopathy:     Cervical: No cervical adenopathy.  Skin:    General: Skin is warm and dry.     Capillary Refill: Capillary refill takes less than 2 seconds.  Neurological:     Mental Status: She is alert and oriented to person, place, and time.  Psychiatric:        Behavior: Behavior normal.      Musculoskeletal Exam: C-spine, thoracic spine, lumbar spine have good range of motion.  No midline spinal tenderness.  No SI joint tenderness upon palpation.  Shoulder joints, elbow joints, wrist joints, MCPs, PIPs, DIPs  have good range of motion with no synovitis.  Complete fist formation bilaterally.  Hip joints have good range of motion with no groin pain.  Tenderness over the left trochanteric bursa.  Knee joints have good range of motion with no warmth or effusion.  Ankle joints have good range of motion with no tenderness or joint swelling.  CDAI Exam: CDAI Score: -- Patient Global: --; Provider Global: -- Swollen: --; Tender: -- Joint Exam 06/10/2023   No joint exam has been documented for this visit   There is currently no information documented on the homunculus. Go to the Rheumatology activity and complete the homunculus joint exam.  Investigation: No additional findings.  Imaging: No results found.  Recent Labs: Lab Results  Component Value Date   WBC 6.2 06/04/2023   HGB 12.0 06/04/2023   PLT 308 06/04/2023   NA 137 06/04/2023   K 3.9 06/04/2023  CL 103 06/04/2023   CO2 27 06/04/2023   GLUCOSE 99 06/04/2023   BUN 11 06/04/2023   CREATININE 0.73 06/04/2023   BILITOT 0.4 06/04/2023   ALKPHOS 33 (L) 01/10/2020   AST 19 06/04/2023   ALT 14 06/04/2023   PROT 6.1 06/04/2023   ALBUMIN 3.9 01/10/2020   CALCIUM 9.2 06/04/2023   GFRAA 99 08/29/2020    Speciality Comments: Plaquenil eye exam: normal on 01/01/2022 [facility] Follow up in 1 year.  PLQ eye exam scheduled for 01/2023 per patient  Procedures:  No procedures performed Allergies: Penicillins, Doxycycline, Adhesive [tape], and Latex      Assessment / Plan:     Visit Diagnoses: Autoimmune disease (HCC) - History of arthralgias, [name], photosensitivity, positive aCL IgM, positive beta-2 GP 1 IgM, positive lupus anticoagulant: She has not had any signs or symptoms of an autoimmune disease flare.  She is clinically been doing well taking Plaquenil 200 mg 1 tablet by mouth daily.  She is tolerating Plaquenil without any side effects and has not had any recent gaps in therapy.  She has intermittent  arthralgias and joint stiffness but remains active exercising on a regular basis.  She had no synovitis on examination today.  She has not had any recent rashes, Raynaud's phenomenon, or increased photosensitivity.  She has not had any oral nasal ulcerations.  Overall her symptoms remain stable on the current treatment regimen.  Lab work from 06/04/23 was reviewed today in the office: ANA negative, ESR WNL, complements WNL, dsDNA negative, CBC WNL, CMP WNL, urine protein creatinine ratio WNL, anticardiolipin antibodies negative, lupus anticoagulant not detected, beta-2 glycoprotein IgM borderline elevated but stable.  Patient was advised to reduce aspirin from 162 mg to 81 mg by Dr. Corliss Skains on 06/08/2023. She will remain on Plaquenil 200 mg 1 tablet by mouth daily.  She is advised to notify us if she develops any new or worsening symptoms.  Future orders for the following lab work will be placed today to be drawn prior to her next appointment.  She will follow-up in the office in 5 months or sooner if needed.  High risk medication use - Plaquenil 200 mg 1 tablet by mouth daily. CBC and CMP updated on 06/04/23.   Plaquenil eye exam: normal on 01/01/2022 [facility] Follow up in 1 year.  PLQ eye exam scheduled for 01/2023 per patient--plan to call eye exam results.    Raynaud's disease without gangrene: Not currently symptomatic.  No digital ulcerations or signs of gangrene.  When patient is walking outdoors she uses gloves and a hat which have been helpful.  No signs of sclerodactyly noted.  Positive cardiolipin antibodies, positive b2 GP1 - Anticardiolipin IgM low titer positive in the past and negative now, beta-2 GP 1 IgM low titer positive.  Lupus anticoagulant was not detected on 06/04/2023.  Patient had been advised to reduce aspirin from 162 mg to 81 mg on 06/08/2023 by Dr. Corliss Skains.  Lupus anticoagulant positive: Lupus anticoagulant not detected on 06/04/2023.  Beta-2  glycoprotein IgM remains borderline positive--stable 24.4 on 06/04/2023.  Anticardiolipin antibodies negative.  Patient was advised to reduce aspirin from 162 mg daily to 81 mg daily by Dr. Corliss Skains on 06/08/23.  Patient has noticed easy bruising on the increased dose of aspirin.  She plans on reducing the dose of aspirin to 81 mg daily.  Plan to recheck lab work in 5 months.  Future orders will be placed today.  Primary  osteoarthritis of both hands: She has no joint tenderness or synovitis on examination today.  Complete fist formation bilaterally.  Trochanteric bursitis, right hip: She had a right trochanteric bursa cortisone injection on 01/08/2023 which provided significant relief.  No tenderness palpation over the right trochanter bursa on examination today.  She is some tenderness over the left trochanteric bursa.  Encouraged patient to perform stretching exercises daily.  Primary osteoarthritis of both knees: She has good range of motion of both knee joints on examination today.  No warmth or effusion noted.  Patient remains active exercising on a regular basis.  Primary osteoarthritis of both feet: She is not experiencing any increased discomfort in her feet at this time.  She had a right first MTP joint cortisone injection performed on 01/08/2023 which righted significant relief.  She is been wearing proper fitting shoes.  She has good range of motion of both ankle joints with no tenderness or synovitis.  DDD (degenerative disc disease), cervical: C-spine has good range of motion on examination today.  Degeneration of intervertebral disc of lumbar region without discogenic back pain or lower extremity pain: No midline spinal tenderness.  No symptoms of radiculopathy.  Osteopenia of multiple sites: DEXA updated on 07/25/2020:The BMD measured at [facility] L1-L2 is 0.962 g/cm2 with a T-score of -1.7.  She is due to update DEXA.   Vitamin D deficiency: She is taking vitamin D 2000 units  daily.  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  Patient states that her oncologist discharged her.  She will return only on as needed basis.   History of colonic polyps  History of migraine  Tinnitus, bilateral  Dyslipidemia  Orders: No orders of the defined types were placed in this encounter.  No orders of the defined types were placed in this encounter.     Follow-Up Instructions: Return in about 5 months (around 11/08/2023) for Autoimmune Disease.   Gearldine Bienenstock, PA-C  Note - This record has been created using Dragon software.  Chart creation errors have been sought, but may not always  have been located. Such creation errors do not reflect on  the standard of medical care.

## 2023-05-30 ENCOUNTER — Encounter: Payer: Self-pay | Admitting: Rheumatology

## 2023-06-04 ENCOUNTER — Other Ambulatory Visit: Payer: Self-pay

## 2023-06-04 DIAGNOSIS — Z79899 Other long term (current) drug therapy: Secondary | ICD-10-CM

## 2023-06-04 DIAGNOSIS — M359 Systemic involvement of connective tissue, unspecified: Secondary | ICD-10-CM

## 2023-06-06 ENCOUNTER — Ambulatory Visit: Payer: 59 | Admitting: Physician Assistant

## 2023-06-06 ENCOUNTER — Encounter: Payer: Self-pay | Admitting: Physician Assistant

## 2023-06-06 VITALS — BP 122/78 | HR 56 | Temp 97.4°F | Ht 66.0 in | Wt 137.2 lb

## 2023-06-06 DIAGNOSIS — R519 Headache, unspecified: Secondary | ICD-10-CM

## 2023-06-06 MED ORDER — FLUTICASONE PROPIONATE 50 MCG/ACT NA SUSP: 2.0000 | Freq: Every day | NASAL | 0 refills | Status: DC

## 2023-06-06 MED ORDER — AZITHROMYCIN 250 MG PO TABS
ORAL_TABLET | ORAL | 0 refills | Status: AC
Start: 2023-06-06 — End: 2023-06-11

## 2023-06-06 NOTE — Progress Notes (Signed)
Alexandra Garner is a 62 y.o. female here for a new problem.  History of Present Illness:   Chief Complaint  Patient presents with   Sinus Problem    Started as jaw pain and tooth pain progressed to yellow mucus with a sore spot on the left side of face/jaw. Neck soreness started this morning     HPI  Congestion Pt reports nasal congestion, mucus production (clear), and constant sneezing about 2 weeks ago.  Was able to continue working from home and going on her regular walks with no limitations.  Her mucus changed from clear to yellow a few days ago.  Yesterday she noticed a loss of taste while eating oatmeal and was later unable to taste a spoonful of peanut butter.  Today her sense of taste has started to come back.  Of note, pt had Covid 3 months ago.  At home Covid test was negative yesterday.  Has not used nasal sprays to relieve sx.  Denies any fatigue, fever, or chills.  She complains of recent jaw soreness and diffuse aches in gums and teeth at onset.  She later began experiencing pain on her bottom left jaw and reports tenderness to the touch.  Reports one episode of sharp ear pain while lying on her left side at nighttime, which resolved completely by the morning.  Symptoms fully resolved 2 days ago.  Pt has appointment with her dentist scheduled in January.   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 Toy Baker   COLONOSCOPY  03/28/2008   PELVIC LAPAROSCOPY     REDUCTION MAMMAPLASTY      Family History  Problem Relation Age of Onset   Clotting disorder Mother    Prostate cancer Father    Colon polyps Father    Cervical cancer Maternal Grandmother    Healthy Daughter     Allergies  Allergen Reactions   Penicillins Shortness Of Breath and Swelling    Other reaction(s): anaphylaxis/angioedema Mouth swells and it gets hard to breathe    Doxycycline Other (See Comments)    Facial nerve pain   Adhesive [Tape] Other (See Comments)    Other reaction(s): other/intolerance blisters blisters   Latex Hives    Current Medications:   Current Outpatient Medications:    acetaminophen (TYLENOL) 500 MG tablet, Take by mouth as needed. , Disp: , Rfl:    aspirin 81 MG tablet, Take 81 mg by mouth 2 (two)  times daily. , Disp: , Rfl:    azithromycin (ZITHROMAX) 250 MG tablet, Take 2 tablets on day 1, then 1 tablet daily on days 2 through 5, Disp: 6 tablet, Rfl: 0   estrogens, conjugated, (PREMARIN) 0.625 MG tablet, TAKE 1 TABLET BY MOUTH DAILY., Disp: 90 tablet, Rfl: 3   fluticasone (FLONASE) 50 MCG/ACT nasal spray, Place 2 sprays into both nostrils daily., Disp: 16 g, Rfl: 0   hydroxychloroquine (PLAQUENIL) 200 MG tablet, TAKE 1 TABLET BY MOUTH EVERY DAY, Disp: 90 tablet, Rfl: 0   ibuprofen (ADVIL) 400 MG tablet, Take by mouth as needed., Disp: , Rfl:    Multiple Vitamins-Minerals (PRESERVISION AREDS) CAPS, Take 1 tablet by mouth daily. , Disp: , Rfl:    traZODone (DESYREL) 50 MG tablet, Take 1 tablet (50 mg total) by mouth at bedtime as needed for sleep., Disp: 30 tablet, Rfl: 1   Vitamin D, Cholecalciferol, 25 MCG (1000 UT)  TABS, Take 2 tablets by mouth daily. , Disp: , Rfl:    Review of Systems:   ROS Negative unless otherwise specified per HPI.   Vitals:   Vitals:   06/06/23 1053  BP: 122/78  Pulse: (!) 56  Temp: (!) 97.4 F (36.3 C)  TempSrc: Temporal  SpO2: 98%  Weight: 137 lb 3.2 oz (62.2 kg)  Height: 5\' 6"  (1.676 m)     Body mass index is 22.14 kg/m.  Physical Exam:   Physical Exam Vitals and nursing note reviewed.  Constitutional:      General: She is not in acute distress.    Appearance: She is well-developed. She is not ill-appearing or toxic-appearing.  HENT:     Nose:     Right Turbinates: Enlarged.     Left Turbinates: Enlarged.  Cardiovascular:     Rate and Rhythm: Normal rate and regular rhythm.     Pulses: Normal pulses.     Heart sounds: Normal heart sounds, S1 normal and S2 normal.  Pulmonary:     Effort: Pulmonary effort is normal.     Breath sounds: Normal breath sounds.  Lymphadenopathy:     Cervical: Cervical adenopathy present.  Skin:    General: Skin is warm and dry.  Neurological:     Mental Status: She is alert.     GCS: GCS eye subscore is 4. GCS verbal subscore is 5. GCS motor subscore is 6.  Psychiatric:        Speech: Speech normal.        Behavior: Behavior normal. Behavior is cooperative.     Assessment and Plan:   Facial pain No red flags on exam.   Recommend plain saline nasal spray, Flonase for a few days If no improvement, recommend starting azithromycin (penicillin and doxycycline allergic) Discussed taking medications as prescribed.  Reviewed return precautions including new or worsening fever, SOB, new or worsening cough or other concerns.  Push fluids and rest.  I recommend that patient follow-up if symptoms worsen or persist despite treatment x 7-10 days, sooner if needed.  I, Isabelle Course, acting as a Neurosurgeon for Jarold Motto, [place]., have documented all relevant documentation on the behalf of Jarold Motto, [place], as directed by   Jarold Motto, PA while in the presence of Jarold Motto, [place].  I, Jarold Motto, [place], have reviewed all documentation for this visit. The documentation on 06/06/23 for the exam, diagnosis, procedures, and orders are all accurate and complete.  Jarold Motto, PA-C

## 2023-06-06 NOTE — Patient Instructions (Signed)
It was great to see you!  Start Nasal saline spray (i.e., Simply Saline) or nasal saline lavage (i.e., NeilMed) is recommended as needed and prior to medicated nasal sprays. Start Flonase 1 spray twice a day. After a few days, if lack of improvement, or any new/worsening symptom(s), please start the azithromycin antibiotic.  Take care,  Jarold Motto PA-C

## 2023-06-08 LAB — ANTI-DNA ANTIBODY, DOUBLE-STRANDED: ds DNA Ab: <1 [IU]/mL

## 2023-06-08 LAB — SEDIMENTATION RATE: Sed Rate: 19 mm/h (ref 0–30)

## 2023-06-08 LAB — COMPLETE METABOLIC PANEL WITH GFR
AG Ratio: 2.2 (calc) (ref 1.0–2.5)
ALT: 14 U/L (ref 6–29)
AST: 19 U/L (ref 10–35)
Albumin: 4.2 g/dL (ref 3.6–5.1)
Alkaline phosphatase (APISO): 48 U/L (ref 37–153)
BUN: 11 mg/dL (ref 7–25)
CO2: 27 mmol/L (ref 20–32)
Calcium: 9.2 mg/dL (ref 8.6–10.4)
Chloride: 103 mmol/L (ref 98–110)
Creat: 0.73 mg/dL (ref 0.50–1.05)
Globulin: 1.9 g/dL (ref 1.9–3.7)
Glucose, Bld: 99 mg/dL (ref 65–99)
Potassium: 3.9 mmol/L (ref 3.5–5.3)
Sodium: 137 mmol/L (ref 135–146)
Total Bilirubin: 0.4 mg/dL (ref 0.2–1.2)
Total Protein: 6.1 g/dL (ref 6.1–8.1)
eGFR: 94 mL/min/{1.73_m2} (ref >=60)

## 2023-06-08 LAB — CBC WITH DIFFERENTIAL/PLATELET
Absolute Lymphocytes: 1476 {cells}/uL (ref 850–3900)
Absolute Monocytes: 378 {cells}/uL (ref 200–950)
Basophils Absolute: 37 {cells}/uL (ref 0–200)
Basophils Relative: 0.6 %
Eosinophils Absolute: 99 {cells}/uL (ref 15–500)
Eosinophils Relative: 1.6 %
HCT: 36.3 % (ref 35.0–45.0)
Hemoglobin: 12 g/dL (ref 11.7–15.5)
MCH: 28.4 pg (ref 27.0–33.0)
MCHC: 33.1 g/dL (ref 32.0–36.0)
MCV: 86 fL (ref 80.0–100.0)
MPV: 11.3 fL (ref 7.5–12.5)
Monocytes Relative: 6.1 %
Neutro Abs: 4210 {cells}/uL (ref 1500–7800)
Neutrophils Relative %: 67.9 %
Platelets: 308 10*3/uL (ref 140–400)
RBC: 4.22 10*6/uL (ref 3.80–5.10)
RDW: 12.5 % (ref 11.0–15.0)
Total Lymphocyte: 23.8 %
WBC: 6.2 10*3/uL (ref 3.8–10.8)

## 2023-06-08 LAB — BETA-2 GLYCOPROTEIN ANTIBODIES
Beta-2 Glyco 1 IgA: <2 U/mL (ref <20.0)
Beta-2 Glyco 1 IgM: 24.4 U/mL — ABNORMAL HIGH (ref <20.0)
Beta-2 Glyco I IgG: 2.2 U/mL (ref <20.0)

## 2023-06-08 LAB — LUPUS ANTICOAGULANT EVAL W/ REFLEX
PTT-LA Screen: 36 s (ref <=40)
dRVVT: 31 s (ref <=45)

## 2023-06-08 LAB — CARDIOLIPIN ANTIBODIES, IGG, IGM, IGA
Anticardiolipin IgA: <2 [APL'U]/mL (ref <20.0)
Anticardiolipin IgG: <2 [GPL'U]/mL (ref <20.0)
Anticardiolipin IgM: 19 [MPL'U]/mL (ref <20.0)

## 2023-06-08 LAB — C3 AND C4
C3 Complement: 119 mg/dL (ref 83–193)
C4 Complement: 18 mg/dL (ref 15–57)

## 2023-06-08 LAB — PROTEIN / CREATININE RATIO, URINE
Creatinine, Urine: 153 mg/dL (ref 20–275)
Protein/Creat Ratio: 72 mg/g{creat} (ref 24–184)
Protein/Creatinine Ratio: 0.072 mg/mg{creat} (ref 0.024–0.184)
Total Protein, Urine: 11 mg/dL (ref 5–24)

## 2023-06-08 LAB — ANA: Anti Nuclear Antibody (ANA): NEGATIVE

## 2023-06-08 NOTE — Progress Notes (Signed)
Beta-2 GP 1 IgM low titer positive and stable anticardiolipin negative, lupus anticoagulant negative, sed rate normal, ANA negative, double-stranded DNA negative, complements normal, CMP normal, CBC normal, urine protein creatinine ratio normal.  Labs do not indicate  active autoimmune disease.  Patient may reduce the dose of aspirin to 81 mg p.o. daily.

## 2023-06-10 ENCOUNTER — Ambulatory Visit: Payer: 59 | Attending: Physician Assistant | Admitting: Physician Assistant

## 2023-06-10 ENCOUNTER — Encounter: Payer: Self-pay | Admitting: Physician Assistant

## 2023-06-10 VITALS — BP 138/78 | HR 54 | Resp 15 | Ht 66.0 in | Wt 136.8 lb

## 2023-06-10 DIAGNOSIS — Z79899 Other long term (current) drug therapy: Secondary | ICD-10-CM | POA: Diagnosis not present

## 2023-06-10 DIAGNOSIS — M19041 Primary osteoarthritis, right hand: Secondary | ICD-10-CM

## 2023-06-10 DIAGNOSIS — Z860100 Personal history of colon polyps, unspecified: Secondary | ICD-10-CM

## 2023-06-10 DIAGNOSIS — E785 Hyperlipidemia, unspecified: Secondary | ICD-10-CM

## 2023-06-10 DIAGNOSIS — M19042 Primary osteoarthritis, left hand: Secondary | ICD-10-CM

## 2023-06-10 DIAGNOSIS — R760 Raised antibody titer: Secondary | ICD-10-CM

## 2023-06-10 DIAGNOSIS — H9313 Tinnitus, bilateral: Secondary | ICD-10-CM

## 2023-06-10 DIAGNOSIS — E559 Vitamin D deficiency, unspecified: Secondary | ICD-10-CM

## 2023-06-10 DIAGNOSIS — M359 Systemic involvement of connective tissue, unspecified: Secondary | ICD-10-CM | POA: Diagnosis not present

## 2023-06-10 DIAGNOSIS — I7300 Raynaud's syndrome without gangrene: Secondary | ICD-10-CM | POA: Diagnosis not present

## 2023-06-10 DIAGNOSIS — M5030 Other cervical disc degeneration, unspecified cervical region: Secondary | ICD-10-CM

## 2023-06-10 DIAGNOSIS — M8589 Other specified disorders of bone density and structure, multiple sites: Secondary | ICD-10-CM

## 2023-06-10 DIAGNOSIS — M19072 Primary osteoarthritis, left ankle and foot: Secondary | ICD-10-CM

## 2023-06-10 DIAGNOSIS — M19071 Primary osteoarthritis, right ankle and foot: Secondary | ICD-10-CM

## 2023-06-10 DIAGNOSIS — M51369 Other intervertebral disc degeneration, lumbar region without mention of lumbar back pain or lower extremity pain: Secondary | ICD-10-CM

## 2023-06-10 DIAGNOSIS — D219 Benign neoplasm of connective and other soft tissue, unspecified: Secondary | ICD-10-CM

## 2023-06-10 DIAGNOSIS — Z8669 Personal history of other diseases of the nervous system and sense organs: Secondary | ICD-10-CM

## 2023-06-10 DIAGNOSIS — M7061 Trochanteric bursitis, right hip: Secondary | ICD-10-CM

## 2023-06-10 DIAGNOSIS — M170 Bilateral primary osteoarthritis of knee: Secondary | ICD-10-CM

## 2023-06-26 ENCOUNTER — Other Ambulatory Visit (HOSPITAL_BASED_OUTPATIENT_CLINIC_OR_DEPARTMENT_OTHER): Payer: Self-pay | Admitting: Family Medicine

## 2023-06-26 DIAGNOSIS — Z1231 Encounter for screening mammogram for malignant neoplasm of breast: Secondary | ICD-10-CM

## 2023-07-01 ENCOUNTER — Inpatient Hospital Stay (HOSPITAL_BASED_OUTPATIENT_CLINIC_OR_DEPARTMENT_OTHER): Admission: RE | Admit: 2023-07-01 | Payer: 59 | Source: Ambulatory Visit | Admitting: Radiology

## 2023-07-03 ENCOUNTER — Encounter (HOSPITAL_BASED_OUTPATIENT_CLINIC_OR_DEPARTMENT_OTHER): Payer: Self-pay | Admitting: Radiology

## 2023-07-03 ENCOUNTER — Ambulatory Visit (HOSPITAL_BASED_OUTPATIENT_CLINIC_OR_DEPARTMENT_OTHER)
Admission: RE | Admit: 2023-07-03 | Discharge: 2023-07-03 | Disposition: A | Discharge status: Home / Self Care | Payer: 59 | Source: Ambulatory Visit | Attending: Family Medicine | Admitting: Family Medicine

## 2023-07-03 DIAGNOSIS — Z1231 Encounter for screening mammogram for malignant neoplasm of breast: Secondary | ICD-10-CM | POA: Insufficient documentation

## 2023-07-04 ENCOUNTER — Other Ambulatory Visit: Payer: Self-pay | Admitting: Physician Assistant

## 2023-08-11 ENCOUNTER — Ambulatory Visit (HOSPITAL_BASED_OUTPATIENT_CLINIC_OR_DEPARTMENT_OTHER): Payer: 59 | Admitting: Certified Nurse Midwife

## 2023-08-11 ENCOUNTER — Encounter (HOSPITAL_BASED_OUTPATIENT_CLINIC_OR_DEPARTMENT_OTHER): Payer: Self-pay | Admitting: Certified Nurse Midwife

## 2023-08-11 ENCOUNTER — Other Ambulatory Visit (HOSPITAL_BASED_OUTPATIENT_CLINIC_OR_DEPARTMENT_OTHER): Payer: Self-pay

## 2023-08-11 VITALS — BP 110/64 | HR 50 | Ht 66.0 in | Wt 140.0 lb

## 2023-08-11 DIAGNOSIS — N760 Acute vaginitis: Secondary | ICD-10-CM | POA: Diagnosis not present

## 2023-08-11 MED ORDER — ESTRADIOL 0.1 MG/GM VA CREA
1.0000 | TOPICAL_CREAM | Freq: Every day | VAGINAL | 6 refills | Status: DC
  Filled 2023-08-11: qty 42.5, 30d supply, fill #0

## 2023-08-11 MED ORDER — ESTROGENS CONJUGATED 0.625 MG PO TABS
0.6250 mg | ORAL_TABLET | Freq: Every day | ORAL | 4 refills | Status: DC
  Filled 2023-08-11 – 2023-10-22 (×2): qty 90, 90d supply, fill #0

## 2023-08-11 MED ORDER — FLUCONAZOLE 150 MG PO TABS
150.0000 mg | ORAL_TABLET | ORAL | 5 refills | Status: DC | PRN
  Filled 2023-08-11: qty 2, 4d supply, fill #0
  Filled 2024-03-01: qty 2, 4d supply, fill #1

## 2023-08-11 NOTE — Progress Notes (Signed)
 Subjective:     Alexandra Garner is a 63 y.o. female here for vaginal and vulvar itching. Pt states she feels like she has a yeast infection. This happens on occasion if she does not change clothes after exercising. She denies vaginal spotting or bleeding. Only complaint is some vulvovaginal itching and burning sensation. There is no risk of STI exposure.    The following portions of the patient's history were reviewed and updated as appropriate: allergies, current medications, past family history, past medical history, past social history, past surgical history, and problem list.   Review of Systems Pertinent items are noted in HPI.    Objective:    BP 110/64 (Cuff Size: Normal)   Pulse (!) 50   Ht 5\' 6"  (1.676 m)   Wt 140 lb (63.5 kg)   LMP 06/25/2003   BMI 22.60 kg/m  General appearance: alert, cooperative, and appears stated age Pelvic:  mild vulvar and vaginal erythema present    Assessment:    Monilial vulvo-vaginitis.    Plan:    Diflucan 150mg  po x one, repeat in 2-3 days x 1 if needed. Letta Kocher

## 2023-08-12 ENCOUNTER — Ambulatory Visit (HOSPITAL_BASED_OUTPATIENT_CLINIC_OR_DEPARTMENT_OTHER): Payer: 59 | Admitting: Certified Nurse Midwife

## 2023-08-13 ENCOUNTER — Other Ambulatory Visit (HOSPITAL_BASED_OUTPATIENT_CLINIC_OR_DEPARTMENT_OTHER): Payer: Self-pay

## 2023-10-07 ENCOUNTER — Other Ambulatory Visit (HOSPITAL_BASED_OUTPATIENT_CLINIC_OR_DEPARTMENT_OTHER): Payer: Self-pay

## 2023-10-07 ENCOUNTER — Other Ambulatory Visit: Payer: Self-pay | Admitting: *Deleted

## 2023-10-07 ENCOUNTER — Other Ambulatory Visit: Payer: Self-pay

## 2023-10-07 DIAGNOSIS — M359 Systemic involvement of connective tissue, unspecified: Secondary | ICD-10-CM

## 2023-10-07 MED ORDER — HYDROXYCHLOROQUINE SULFATE 200 MG PO TABS
200.0000 mg | ORAL_TABLET | Freq: Every day | ORAL | 0 refills | Status: DC
  Filled 2023-10-07: qty 90, 90d supply, fill #0

## 2023-10-07 NOTE — Telephone Encounter (Signed)
 Last Fill: 02/25/2023  Eye exam: 02/04/2023 WNL    Labs: 06/04/2023 CMP normal, CBC normal   Next Visit: 12/11/2023  Last Visit: 06/10/2023  DX:Autoimmune disease   Current Dose per office note 06/10/2023: Plaquenil 200 mg 1 tablet by mouth daily.   Okay to refill Plaquenil?

## 2023-10-22 ENCOUNTER — Encounter (HOSPITAL_BASED_OUTPATIENT_CLINIC_OR_DEPARTMENT_OTHER): Payer: Self-pay

## 2023-10-22 ENCOUNTER — Other Ambulatory Visit (HOSPITAL_BASED_OUTPATIENT_CLINIC_OR_DEPARTMENT_OTHER): Payer: Self-pay

## 2023-10-23 ENCOUNTER — Other Ambulatory Visit (HOSPITAL_BASED_OUTPATIENT_CLINIC_OR_DEPARTMENT_OTHER): Payer: Self-pay

## 2023-10-24 ENCOUNTER — Other Ambulatory Visit (HOSPITAL_BASED_OUTPATIENT_CLINIC_OR_DEPARTMENT_OTHER): Payer: Self-pay

## 2023-11-27 NOTE — Progress Notes (Signed)
 Office Visit Note  Patient: Alexandra Garner             Date of Birth: December 26, 1960           MRN: [id number]             PCP: Luevenia Saha, MD Referring: Luevenia Saha, MD Visit Date: 12/11/2023 Occupation: @GUAROCC @  Subjective:  Medication management  History of Present Illness: Alexandra Garner is a 63 y.o. female with autoimmune disease and osteoarthritis.  She returns today for the follow-up visit after December 2024.  She states she has been doing well without any increased joint pain or joint swelling.  She has intermittent discomfort in her joints related to activities.  She plays tennis and golf and exercises on a regular basis.  She has bunions in her feet which are not very bothersome.  She has noticed a callus on her left bunion.  She believes it is related to her new shoes.  She continues to be on Plaquenil  200 mg p.o. q. a day and aspirin 81 mg daily.  She denies any history of oral ulcers, nasal ulcers, malar rash, photosensitivity, Raynaud's, lymphadenopathy, sicca symptoms or inflammatory arthritis.    Activities of Daily Living:  Patient reports morning stiffness for 0  minute.   Patient Denies nocturnal pain.  Difficulty dressing/grooming: Denies Difficulty climbing stairs: Denies Difficulty getting out of chair: Denies Difficulty using hands for taps, buttons, cutlery, and/or writing: Denies  Review of Systems  Constitutional:  Negative for fatigue.  HENT:  Negative for mouth sores and mouth dryness.   Eyes:  Negative for dryness.  Respiratory:  Negative for shortness of breath.   Cardiovascular:  Negative for chest pain and palpitations.  Gastrointestinal:  Negative for blood in stool, constipation and diarrhea.  Endocrine: Negative for increased urination.  Genitourinary:  Negative for hematuria.  Musculoskeletal:  Negative for joint pain, gait problem, joint pain, joint swelling, myalgias, muscle weakness, morning stiffness and myalgias.  Skin:  Negative for  color change, rash and sensitivity to sunlight.  Allergic/Immunologic: Negative for susceptible to infections.  Neurological:  Negative for dizziness and headaches.  Hematological:  Negative for swollen glands.  Psychiatric/Behavioral:  Negative for depressed mood and sleep disturbance. The patient is not nervous/anxious.     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   Family history of macular degeneration 02/03/2015   Primary localized osteoarthrosis of hand 12/23/2013   History of colonic polyps 11/01/2008    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 Rayburn Cagey   COLONOSCOPY  03/28/2008   PELVIC LAPAROSCOPY     REDUCTION MAMMAPLASTY     Social History   Social History Narrative   Not on file   Immunization History  Administered Date(s) Administered    PFIZER(Purple Top)SARS-COV-2 Vaccination 07/20/2019, 08/11/2019, 03/30/2020, 10/30/2020, 04/18/2022   Pfizer Covid-19 Vaccine Bivalent Booster 66yrs & up 04/01/2021   Pfizer(Comirnaty)Fall Seasonal Vaccine 12 years and older 04/06/2022   Tdap 10/01/2011, 05/02/2022   Zoster Recombinant(Shingrix ) 04/15/2019, 06/17/2019     Objective: Vital Signs: BP 131/80 (BP Location: Left Arm, Patient Position: Sitting, Cuff Size: Normal)   Pulse (!) 58   Resp 14   Ht 5' 6 (1.676 m)   Wt 138 lb (62.6 kg)   LMP 06/25/2003   BMI 22.27 kg/m    Physical Exam Vitals and nursing note reviewed.  Constitutional:      Appearance: She is well-developed.  HENT:     Head: Normocephalic and atraumatic.   Eyes:     Conjunctiva/sclera: Conjunctivae normal.    Cardiovascular:     Rate and Rhythm: Normal rate and regular rhythm.     Heart sounds: Normal heart sounds.  Pulmonary:     Effort: Pulmonary effort is normal.     Breath sounds: Normal breath sounds.  Abdominal:     General: Bowel sounds are normal.     Palpations: Abdomen is soft.   Musculoskeletal:     Cervical back: Normal range of motion.  Lymphadenopathy:     Cervical: No cervical adenopathy.   Skin:    General: Skin is warm and dry.     Capillary Refill: Capillary refill takes less than 2 seconds.   Neurological:     Mental Status: She is alert and oriented to person, place, and time.   Psychiatric:        Behavior: Behavior normal.      Musculoskeletal Exam: Cervical, thoracic and lumbar spine were in good range of motion with no tenderness.  Shoulder joints, elbow joints, wrist joints, MCPs PIPs and DIPs with good range of motion.  Hip joints and knee joints in good range of motion without any warmth swelling or effusion.  There was no tenderness over ankles or MTPs.  Bilateral bunions were noted.  A callus was noted over the left bunion on the medial aspect.  So  CDAI Exam: CDAI Score: -- Patient Global: --; Provider  Global: -- Swollen: --; Tender: -- Joint Exam 12/11/2023   No joint exam has been documented for this visit   There is currently no information documented on the homunculus. Go to the Rheumatology activity and complete the homunculus joint exam.  Investigation: No additional findings.  Imaging: No results found.  Recent Labs: Lab Results  Component Value Date   WBC 5.7 12/09/2023   HGB 12.2 12/09/2023   PLT 275 12/09/2023   NA 139 12/09/2023   K 4.2 12/09/2023   CL 105 12/09/2023   CO2 26 12/09/2023   GLUCOSE 89 12/09/2023   BUN 10 12/09/2023   CREATININE 0.81 12/09/2023   BILITOT 0.5 12/09/2023   ALKPHOS 33 (L) 01/10/2020   AST 19 12/09/2023   ALT 12 12/09/2023   PROT 6.1 12/09/2023  ALBUMIN 3.9 01/10/2020   CALCIUM 9.2 12/09/2023   GFRAA 99 08/29/2020   June 04, 2023 ANA negative, dsDNA negative, complements normal, sed rate 19, beta-2  GP 1 IgM 24.4, anticardiolipin negative, lupus anticoagulant negative, urine protein creatinine ratio normal  December 09, 2023 CBC and CMP normal, urine protein creatinine ratio normal, complements normal, sed rate normal, (anti-DNA, anticardiolipin, beta-2  GP 1 and lupus anticoagulant pending)  Speciality Comments: Plaquenil  eye exam: normal on 02/04/2023 WNL [facility] Follow up in 1 year.   Procedures:  No procedures performed Allergies: Penicillins, Doxycycline , Adhesive [tape], and Latex   Assessment / Plan:     Visit Diagnoses: Autoimmune disease (HCC) - History of arthralgias, [name], photosensitivity, positive aCL IgM, positive beta-2  GP 1 IgM, positive lupus anticoagulant: -She is doing well without any increased fatigue, oral ulcers, nasal ulcers, malar rash, photosensitivity, Raynaud's or inflammatory arthritis.  No synovitis was noted on the examination.  Labs obtained on June 17, 2025CBC and CMP normal, urine protein creatinine ratio normal, complements normal, sed rate normal, (anti-DNA,  anticardiolipin, beta-2  GP 1 and lupus anticoagulant pending) were reviewed.  Will call her with the results of the pending labs.  Will recheck labs in 5 months prior to her next visit.  Plan: Protein / creatinine ratio, urine, Anti-DNA antibody, double-stranded, C3 and C4, Sedimentation rate  High risk medication use - Plaquenil  200 mg p.o. daily, aspirin 81 mg p.o. daily.  Eye examination was on February 04, 2023.  Annual eye examination was advised.- Plan: CBC with Differential/Platelet, Comprehensive metabolic panel with GFR  Raynaud's disease without gangrene-keeping potential warm and warm clothing was discussed.  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 - Plan: Lupus Anticoagulant Eval w/Reflex with next labs  Positive cardiolipin antibodies, positive b2 GP1 - Beta-2  GP 1 IgM low titer positive, anticardiolipin negative now.  Patient remains on aspirin 81 mg p.o. daily.  Increased risk of bleeding with aspirin was discussed.  Patient has not noticed any increased bruising or bleeding since she has been on lower dose of aspirin.  If her anticardiolipin titers are still low and lupus anticoagulant remains negative we may consider reducing the dose of aspirin to every other day in the future.- Plan: Beta-2  glycoprotein antibodies, Cardiolipin antibodies, IgG, IgM, IgA  Primary osteoarthritis of both hands-she has bilateral PIP and DIP thickening.  No synovitis was noted on the examination.  Trochanteric bursitis, right hip-currently not symptomatic.  Primary osteoarthritis of both knees-she is currently not symptomatic.  Primary osteoarthritis of both feet-she had bilateral bunions.  A callus was noted over the left bunion.  Proper fitting shoes were advised.  DDD (degenerative disc disease), cervical-she had good range of motion of the cervical spine without discomfort.  Degeneration of intervertebral disc of lumbar region without discogenic back pain or lower extremity  pain-she had no tenderness at good mobility in her lumbar spine.  Osteopenia of multiple sites-DEXA 07/25/2020 The BMD measured at [facility] L1-L2 is 0.962 g/cm2 with a T-score of -1.7.  She has been taking calcium.  Patient will get repeat DEXA scan through Dr. Annabell Key.  Vitamin D  deficiency-vitamin D  was normal at 41 on July 24, 2022.  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  She was discharged by oncology.  History of colonic polyps  Tinnitus, bilateral  History of migraine  Dyslipidemia  Orders: Orders Placed This Encounter  Procedures   Protein / creatinine ratio, urine   CBC  with Differential/Platelet   Anti-DNA antibody, double-stranded   C3 and C4   Sedimentation rate   Comprehensive metabolic panel with GFR   Beta-2  glycoprotein antibodies   Cardiolipin antibodies, IgG, IgM, IgA   Lupus Anticoagulant Eval w/Reflex   No orders of the defined types were placed in this encounter.   Follow-Up Instructions: Return in about 5 months (around 05/12/2024) for Autoimmune disease.   Nicholas Bari, MD  Note - This record has been created using Animal nutritionist.  Chart creation errors have been sought, but may not always  have been located. Such creation errors do not reflect on  the standard of medical care.

## 2023-12-09 ENCOUNTER — Other Ambulatory Visit: Payer: Self-pay | Admitting: *Deleted

## 2023-12-09 DIAGNOSIS — M359 Systemic involvement of connective tissue, unspecified: Secondary | ICD-10-CM

## 2023-12-09 DIAGNOSIS — R760 Raised antibody titer: Secondary | ICD-10-CM

## 2023-12-09 DIAGNOSIS — Z79899 Other long term (current) drug therapy: Secondary | ICD-10-CM

## 2023-12-10 ENCOUNTER — Ambulatory Visit: Payer: Self-pay | Admitting: Physician Assistant

## 2023-12-10 NOTE — Progress Notes (Signed)
 CBC and CMP WNL  Complements WNL ESR WNL

## 2023-12-10 NOTE — Progress Notes (Signed)
 Urine protein creatinine ratio WNL

## 2023-12-11 ENCOUNTER — Ambulatory Visit: Payer: 59 | Attending: Rheumatology | Admitting: Rheumatology

## 2023-12-11 ENCOUNTER — Encounter: Payer: Self-pay | Admitting: Rheumatology

## 2023-12-11 VITALS — BP 131/80 | HR 58 | Resp 14 | Ht 66.0 in | Wt 138.0 lb

## 2023-12-11 DIAGNOSIS — M51369 Other intervertebral disc degeneration, lumbar region without mention of lumbar back pain or lower extremity pain: Secondary | ICD-10-CM

## 2023-12-11 DIAGNOSIS — Z79899 Other long term (current) drug therapy: Secondary | ICD-10-CM | POA: Diagnosis not present

## 2023-12-11 DIAGNOSIS — M170 Bilateral primary osteoarthritis of knee: Secondary | ICD-10-CM

## 2023-12-11 DIAGNOSIS — R760 Raised antibody titer: Secondary | ICD-10-CM

## 2023-12-11 DIAGNOSIS — I7300 Raynaud's syndrome without gangrene: Secondary | ICD-10-CM

## 2023-12-11 DIAGNOSIS — M19072 Primary osteoarthritis, left ankle and foot: Secondary | ICD-10-CM

## 2023-12-11 DIAGNOSIS — M359 Systemic involvement of connective tissue, unspecified: Secondary | ICD-10-CM | POA: Diagnosis not present

## 2023-12-11 DIAGNOSIS — Z8669 Personal history of other diseases of the nervous system and sense organs: Secondary | ICD-10-CM

## 2023-12-11 DIAGNOSIS — M8589 Other specified disorders of bone density and structure, multiple sites: Secondary | ICD-10-CM

## 2023-12-11 DIAGNOSIS — M5030 Other cervical disc degeneration, unspecified cervical region: Secondary | ICD-10-CM

## 2023-12-11 DIAGNOSIS — Z860100 Personal history of colon polyps, unspecified: Secondary | ICD-10-CM

## 2023-12-11 DIAGNOSIS — M19071 Primary osteoarthritis, right ankle and foot: Secondary | ICD-10-CM

## 2023-12-11 DIAGNOSIS — E785 Hyperlipidemia, unspecified: Secondary | ICD-10-CM

## 2023-12-11 DIAGNOSIS — D219 Benign neoplasm of connective and other soft tissue, unspecified: Secondary | ICD-10-CM

## 2023-12-11 DIAGNOSIS — E559 Vitamin D deficiency, unspecified: Secondary | ICD-10-CM

## 2023-12-11 DIAGNOSIS — M19041 Primary osteoarthritis, right hand: Secondary | ICD-10-CM

## 2023-12-11 DIAGNOSIS — H9313 Tinnitus, bilateral: Secondary | ICD-10-CM

## 2023-12-11 DIAGNOSIS — M19042 Primary osteoarthritis, left hand: Secondary | ICD-10-CM

## 2023-12-11 DIAGNOSIS — M7061 Trochanteric bursitis, right hip: Secondary | ICD-10-CM

## 2023-12-11 NOTE — Progress Notes (Signed)
 dsDNA is negative

## 2023-12-11 NOTE — Patient Instructions (Signed)
 Vaccines You are taking a medication(s) that can suppress your immune system.  The following immunizations are recommended: Flu annually Covid-19  Td/Tdap (tetanus, diphtheria, pertussis) every 10 years Pneumonia (Prevnar 15 then Pneumovax 23 at least 1 year apart.  Alternatively, can take Prevnar 20 without needing additional dose) Shingrix: 2 doses from 4 weeks to 6 months apart  Please check with your PCP to make sure you are up to date.

## 2023-12-12 LAB — COMPREHENSIVE METABOLIC PANEL WITH GFR
AG Ratio: 2.2 (calc) (ref 1.0–2.5)
ALT: 12 U/L (ref 6–29)
AST: 19 U/L (ref 10–35)
Albumin: 4.2 g/dL (ref 3.6–5.1)
Alkaline phosphatase (APISO): 39 U/L (ref 37–153)
BUN: 10 mg/dL (ref 7–25)
CO2: 26 mmol/L (ref 20–32)
Calcium: 9.2 mg/dL (ref 8.6–10.4)
Chloride: 105 mmol/L (ref 98–110)
Creat: 0.81 mg/dL (ref 0.50–1.05)
Globulin: 1.9 g/dL (ref 1.9–3.7)
Glucose, Bld: 89 mg/dL (ref 65–99)
Potassium: 4.2 mmol/L (ref 3.5–5.3)
Sodium: 139 mmol/L (ref 135–146)
Total Bilirubin: 0.5 mg/dL (ref 0.2–1.2)
Total Protein: 6.1 g/dL (ref 6.1–8.1)
eGFR: 83 mL/min/{1.73_m2} (ref >=60)

## 2023-12-12 LAB — CBC WITH DIFFERENTIAL/PLATELET
Absolute Lymphocytes: 1157 {cells}/uL (ref 850–3900)
Absolute Monocytes: 314 {cells}/uL (ref 200–950)
Basophils Absolute: 23 {cells}/uL (ref 0–200)
Basophils Relative: 0.4 %
Eosinophils Absolute: 91 {cells}/uL (ref 15–500)
Eosinophils Relative: 1.6 %
HCT: 37.9 % (ref 35.0–45.0)
Hemoglobin: 12.2 g/dL (ref 11.7–15.5)
MCH: 28.2 pg (ref 27.0–33.0)
MCHC: 32.2 g/dL (ref 32.0–36.0)
MCV: 87.5 fL (ref 80.0–100.0)
MPV: 10.9 fL (ref 7.5–12.5)
Monocytes Relative: 5.5 %
Neutro Abs: 4115 {cells}/uL (ref 1500–7800)
Neutrophils Relative %: 72.2 %
Platelets: 275 10*3/uL (ref 140–400)
RBC: 4.33 10*6/uL (ref 3.80–5.10)
RDW: 12.5 % (ref 11.0–15.0)
Total Lymphocyte: 20.3 %
WBC: 5.7 10*3/uL (ref 3.8–10.8)

## 2023-12-12 LAB — PROTEIN / CREATININE RATIO, URINE
Creatinine, Urine: 336 mg/dL — ABNORMAL HIGH (ref 20–275)
Protein/Creat Ratio: 60 mg/g{creat} (ref 24–184)
Protein/Creatinine Ratio: 0.06 mg/mg{creat} (ref 0.024–0.184)
Total Protein, Urine: 20 mg/dL (ref 5–24)

## 2023-12-12 LAB — CARDIOLIPIN ANTIBODIES, IGG, IGM, IGA
Anticardiolipin IgA: <2 [APL'U]/mL (ref <20.0)
Anticardiolipin IgG: <2 [GPL'U]/mL (ref <20.0)
Anticardiolipin IgM: 17.1 [MPL'U]/mL (ref <20.0)

## 2023-12-12 LAB — BETA-2 GLYCOPROTEIN ANTIBODIES
Beta-2 Glyco 1 IgA: <2 U/mL (ref <20.0)
Beta-2 Glyco 1 IgM: 26.3 U/mL — ABNORMAL HIGH (ref <20.0)
Beta-2 Glyco I IgG: 3 U/mL (ref <20.0)

## 2023-12-12 LAB — C3 AND C4
C3 Complement: 112 mg/dL (ref 83–193)
C4 Complement: 16 mg/dL (ref 15–57)

## 2023-12-12 LAB — LUPUS ANTICOAGULANT EVAL W/ REFLEX
PTT-LA Screen: 41 s — ABNORMAL HIGH (ref <=40)
dRVVT: 39 s (ref <=45)

## 2023-12-12 LAB — THROMBIN CLOTTING TIME: Thrombin Clotting Time: 15 s (ref 13–19)

## 2023-12-12 LAB — SEDIMENTATION RATE: Sed Rate: 9 mm/h (ref 0–30)

## 2023-12-12 LAB — RFLX HEXAGONAL PHASE CONFIRM: Hexagonal Phase Conf: POSITIVE — AB

## 2023-12-12 LAB — ANTI-DNA ANTIBODY, DOUBLE-STRANDED: ds DNA Ab: 1 [IU]/mL

## 2023-12-12 NOTE — Progress Notes (Signed)
 Lupus anticoagulant detected. She continues to take aspirin 81 mg daily.

## 2023-12-14 NOTE — Progress Notes (Signed)
 Beta-2  glycoprotein IgM remains positive-stable.  Anticardiolipin antibodies negative

## 2023-12-29 ENCOUNTER — Other Ambulatory Visit: Payer: Self-pay | Admitting: Physician Assistant

## 2023-12-29 DIAGNOSIS — M359 Systemic involvement of connective tissue, unspecified: Secondary | ICD-10-CM

## 2023-12-30 ENCOUNTER — Other Ambulatory Visit (HOSPITAL_BASED_OUTPATIENT_CLINIC_OR_DEPARTMENT_OTHER): Payer: Self-pay

## 2023-12-30 MED ORDER — HYDROXYCHLOROQUINE SULFATE 200 MG PO TABS
200.0000 mg | ORAL_TABLET | Freq: Every day | ORAL | 0 refills | Status: DC
  Filled 2023-12-30: qty 30, 30d supply, fill #0
  Filled 2024-02-07: qty 30, 30d supply, fill #1
  Filled 2024-03-05: qty 30, 30d supply, fill #2

## 2023-12-30 NOTE — Telephone Encounter (Signed)
 Last Fill: 10/07/2023  Eye exam: 02/04/2023 WNL    Labs: 12/09/2023 CBC and CMP WNL   Next Visit: 06/11/2024  Last Visit: 12/11/2023  DX: Autoimmune disease   Current Dose per office note 12/11/2023: Plaquenil  200 mg p.o. daily   Okay to refill Plaquenil ?

## 2024-02-07 ENCOUNTER — Other Ambulatory Visit (HOSPITAL_BASED_OUTPATIENT_CLINIC_OR_DEPARTMENT_OTHER): Payer: Self-pay

## 2024-02-09 ENCOUNTER — Other Ambulatory Visit (HOSPITAL_BASED_OUTPATIENT_CLINIC_OR_DEPARTMENT_OTHER): Payer: Self-pay

## 2024-04-08 ENCOUNTER — Other Ambulatory Visit (HOSPITAL_BASED_OUTPATIENT_CLINIC_OR_DEPARTMENT_OTHER): Payer: Self-pay

## 2024-04-08 DIAGNOSIS — M359 Systemic involvement of connective tissue, unspecified: Secondary | ICD-10-CM

## 2024-04-08 MED ORDER — HYDROXYCHLOROQUINE SULFATE 200 MG PO TABS
200.0000 mg | ORAL_TABLET | Freq: Every day | ORAL | 0 refills | Status: DC
  Filled 2024-04-08: qty 30, 30d supply, fill #0
  Filled 2024-05-04: qty 30, 30d supply, fill #1
  Filled 2024-06-05: qty 30, 30d supply, fill #2

## 2024-04-08 NOTE — Telephone Encounter (Signed)
 Last Fill: 12/30/2023  Eye exam: 03/02/2024 WNL   Labs: 12/09/2023 CBC and CMP WNL Complements WNL ESR WNL Urine protein creatinine ratio WNL  dsDNA is negative  Lupus anticoagulant detected. She continues to take aspirin 81 mg daily.  Beta-2  glycoprotein IgM remains positive-stable.  Anticardiolipin antibodies negative  Next Visit: 06/11/2024  Last Visit: 12/11/2023  IK:Jlunpfflwz disease (HCC)   Current Dose per office note 12/11/2023: Plaquenil  200 mg p.o. daily   Okay to refill Plaquenil ?

## 2024-04-09 ENCOUNTER — Other Ambulatory Visit (HOSPITAL_BASED_OUTPATIENT_CLINIC_OR_DEPARTMENT_OTHER): Payer: Self-pay

## 2024-05-04 NOTE — Progress Notes (Unsigned)
 Office Visit Note  Patient: Alexandra Garner             Date of Birth: 1960-12-02           MRN: [id number]             PCP: Jodie Lavern CROME, MD Referring: Jodie Lavern CROME, MD Visit Date: 05/11/2024 Occupation: Data Unavailable  Subjective:  Discuss lab results   History of Present Illness: Alexandra Garner is a 63 y.o. female with history of autoimmune disease and osteoarthritis. Patient remains on Plaquenil  200 mg p.o. daily and aspirin 81 mg p.o. daily.  She is tolerating Plaquenil  without any side effects and has not had any gaps in therapy.  Patient reports about 3 weeks ago she started to have a recurrence of pain affecting the left hand.  Patient states that the discomfort in her left hand has subsided but she has since started to have increased generalized pain and fatigue.  Patient states that she had 3 days of right great toe pain.  Patient states that she was having diffuse swelling of the right foot as well as difficulty walking due to severity of symptoms.  Patient states that she has also had recent nasal ulcers which are now healing.  She denies any oral ulcers.  She denies any hair loss.  Patient continues to have intermittent times of Raynaud's phenomenon which have been manageable.  She denies any medication changes.   Activities of Daily Living:  Patient reports morning stiffness for 20-30 minutes.   Patient Reports nocturnal pain.  Difficulty dressing/grooming: Denies Difficulty climbing stairs: Denies Difficulty getting out of chair: Denies Difficulty using hands for taps, buttons, cutlery, and/or writing: Reports  Review of Systems  Constitutional:  Positive for fatigue.  HENT:  Negative for mouth sores and mouth dryness.   Eyes:  Negative for dryness.  Respiratory:  Negative for shortness of breath.   Cardiovascular:  Positive for palpitations. Negative for chest pain.  Gastrointestinal:  Positive for blood in stool. Negative for constipation and diarrhea.   Endocrine: Negative for increased urination.  Genitourinary:  Negative for involuntary urination.  Musculoskeletal:  Positive for joint pain, joint pain, joint swelling, myalgias, muscle weakness, morning stiffness, muscle tenderness and myalgias. Negative for gait problem.  Skin:  Positive for sensitivity to sunlight. Negative for color change, rash and hair loss.  Allergic/Immunologic: Negative for susceptible to infections.  Neurological:  Negative for dizziness and headaches.  Hematological:  Negative for swollen glands.  Psychiatric/Behavioral:  Positive for sleep disturbance. Negative for depressed mood. The patient is not nervous/anxious.     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   Family history of macular degeneration 02/03/2015   Primary localized osteoarthrosis of hand 12/23/2013   History of colonic polyps 11/01/2008    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   Autoimmune disease    in the Lupus family   Fibroid    Hand pain    left hand-had 2 cortisone injections   Hx of migraines    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 Montie Cable   COLONOSCOPY  03/28/2008   PELVIC LAPAROSCOPY     REDUCTION MAMMAPLASTY     Social History   Tobacco Use   Smoking status: Never    Passive exposure: Never   Smokeless tobacco: Never  Vaping Use   Vaping status: Never Used  Substance Use Topics   Alcohol use: Yes    Alcohol/week: 2.0 standard drinks of alcohol    Types: 2 Standard drinks or equivalent per week    Comment: occ   Drug use: No   Social History   Social History Narrative   Not on file     Immunization History  Administered Date(s) Administered   PFIZER(Purple Top)SARS-COV-2 Vaccination 07/20/2019, 08/11/2019, 03/30/2020, 10/30/2020, 04/18/2022   Pfizer Covid-19 Vaccine Bivalent Booster 80yrs & up 04/01/2021   Pfizer(Comirnaty)Fall Seasonal Vaccine 12 years and older 04/06/2022   Tdap 10/01/2011, 05/02/2022   Zoster Recombinant(Shingrix ) 04/15/2019, 06/17/2019     Objective: Vital Signs: BP (!) 164/85   Pulse (!) 55   Temp 97.9 F (36.6 C)   Resp 14   Ht 5' 6 (1.676 m)   Wt 142 lb 3.2 oz (64.5 kg)   LMP 06/25/2003   BMI 22.95 kg/m    Physical Exam Vitals and nursing note reviewed.  Constitutional:      Appearance: She is well-developed.  HENT:     Head: Normocephalic and atraumatic.  Eyes:     Conjunctiva/sclera: Conjunctivae normal.  Cardiovascular:     Rate and Rhythm: Normal rate and regular rhythm.     Heart sounds: Normal heart sounds.  Pulmonary:     Effort: Pulmonary effort is normal.     Breath sounds: Normal breath sounds.  Abdominal:     General: Bowel sounds are normal.     Palpations: Abdomen is soft.  Musculoskeletal:     Cervical back: Normal range of motion.  Lymphadenopathy:     Cervical: No cervical adenopathy.  Skin:    General: Skin is warm and dry.     Capillary Refill: Capillary refill takes less than 2 seconds.  Neurological:     Mental Status: She is  alert and oriented to person, place, and time.  Psychiatric:        Behavior: Behavior normal.      Musculoskeletal Exam: C-spine, thoracic spine, lumbar spine have good range of motion.  No midline spinal tenderness.  No SI joint tenderness.  Shoulder joints, elbow joints, wrist joints, MCPs, PIPs, DIPs have good range of motion with no synovitis.  Complete fist formation bilaterally.  Hip joints have good range of motion with no groin pain.  Knee joints have good range of motion no warmth or effusion.  Ankle joints have good range of motion no tenderness or joint swelling.  No evidence of Achilles tendinitis or plantar fasciitis.  Thickening of bilateral first MTP joints with prominence bilaterally.  Mild diffuse edema noted on the dorsal  aspect of the right foot.   CDAI Exam: CDAI Score: -- Patient Global: --; Provider Global: -- Swollen: --; Tender: -- Joint Exam 05/11/2024   No joint exam has been documented for this visit   There is currently no information documented on the homunculus. Go to the Rheumatology activity and complete the homunculus joint exam.  Investigation: No additional findings.  Imaging: No results found.  Recent Labs: Lab Results  Component Value Date   WBC 5.1 05/05/2024   HGB 12.6 05/05/2024   PLT 274 05/05/2024   NA 141 05/05/2024   K 4.5 05/05/2024   CL 105 05/05/2024   CO2 27 05/05/2024   GLUCOSE 77 05/05/2024   BUN 12 05/05/2024   CREATININE 0.80 05/05/2024   BILITOT 0.6 05/05/2024   ALKPHOS 33 (L) 01/10/2020   AST 20 05/05/2024   ALT 14 05/05/2024   PROT 6.7 05/05/2024   ALBUMIN 3.9 01/10/2020   CALCIUM 9.7 05/05/2024   GFRAA 99 08/29/2020    Speciality Comments: Plaquenil  eye exam: normal on 03/02/2024 [facility] Follow up in 1 year.   Procedures:  No procedures performed Allergies: Penicillins, Doxycycline , Adhesive [tape], and Latex   Assessment / Plan:     Visit Diagnoses: Autoimmune disease - History of  arthralgias, [name], photosensitivity, positive aCL IgM, positive beta-2  GP 1 IgM, positive lupus anticoagulant: Patient presents today experiencing a flare for the past 2 weeks.  She has been experiencing increased generalized arthralgias and fatigue.  She also had a recurrence of pain affecting the left hand as well as pain and swelling affecting the right great toe.  She recently had nasal ulcers which are now starting to heal.  No oral ulcers.  No increase sicca symptoms.  Her symptoms of Raynaud's phenomenon have been manageable. Lab work from 05/05/24 was reviewed today in the office: dsDNA-, ESR WNL, C4 low-14, C3 WNL, anticardiolipin antibodies negative, Beta-2  glycoprotein antibodies negative, and lupus anticoagulant detected, CBC and CMP WNL.   She has been taking Plaquenil  200 mg 1 tablet by mouth daily and remains on aspirin 81 mg daily.  She has not had any gaps in therapy.  No identifiable trigger for the flare.  Different treatment options were discussed.  A low-dose prednisone taper starting at 10 mg tapering by 2.5 mg every 2 days will be sent to the pharmacy to take if needed.  Instructions were provided including taking prednisone in the morning with food and avoiding the use of NSAIDs.  She will remain on Plaquenil  as prescribed.  She was advised to notify us  if her symptoms persist or worsen.  She will follow up in 3 months or sooner if needed.   High risk medication use - Plaquenil  200 mg 1 tablet by mouth daily, aspirin 81 mg p.o. daily.   Plaquenil  eye exam: normal on 03/02/2024 [facility] Follow up in 1 year.  CBC and CMP updated on 05/05/24.    Raynaud's disease without gangrene: She has intermittent symptoms of Raynaud's phenomenon.  No digital ulcerations or signs of gangrene were noted.  No signs of sclerodactyly noted.  Overall her symptoms have been manageable.  Lupus anticoagulant positive -Lupus anticoagulant detected on 05/05/2024.  She remains on  aspirin 81 mg daily.  No venous or arterial clots.  Positive cardiolipin antibodies, positive b2 GP1 - Beta-2  GP 1 IgM low titer positive, anticardiolipin negative now.  Beta-2  glycoprotein's and anticardiolipin antibodies negative on 05/05/2024.  Patient remains on aspirin 81 mg  p.o. daily.  Primary osteoarthritis of both hands: She has PIP and DIP thickening consistent with osteoarthritis of both hands.  No synovitis noted.  Complete fist formation bilaterally.  She had a recent exacerbation of pain in the left hand about 3 weeks ago which has since resolved.    Trochanteric bursitis, right hip: Not currently symptomatic.  Primary osteoarthritis of both knees: Good range of motion of both knee joints on examination today.  No warmth or effusion noted.  Primary osteoarthritis of both feet: She has good range of motion of both ankle joints with no tenderness or synovitis.  No evidence of Achilles tendinitis or plantar fasciitis.  Thickening and prominence of both first MTP joints noted.  Mild tenderness of the right first MTP joint with mild diffuse edema of the right foot.  DDD (degenerative disc disease), cervical: C-spine has good range of motion.  No symptoms of radiculopathy.  Degeneration of intervertebral disc of lumbar region without discogenic back pain or lower extremity pain: No symptoms of radiculopathy.   Osteopenia of multiple sites -DEXA 07/25/2020 The BMD measured at [facility] L1-L2 is 0.962 g/cm2 with a T-score of -1.7.  No updated DEXA available to review in Epic.  She has been taking vitamin D  2000 units daily. DEXA ordered by Dr. Cleotilde.  Vitamin D  deficiency: She is taking vitamin D  2000 units daily.   Other medical conditions are listed as follows:   Myxoma  History of colonic polyps  Tinnitus, bilateral  History of migraine  Dyslipidemia  Orders: No orders of the defined types were placed in this encounter.  No orders of the defined types were placed in this  encounter.    Follow-Up Instructions: Return in about 3 months (around 08/11/2024).   Alexandra CHRISTELLA Craze, PA-C  Note - This record has been created using Dragon software.  Chart creation errors have been sought, but may not always  have been located. Such creation errors do not reflect on  the standard of medical care.

## 2024-05-05 ENCOUNTER — Other Ambulatory Visit: Payer: Self-pay | Admitting: *Deleted

## 2024-05-05 DIAGNOSIS — M359 Systemic involvement of connective tissue, unspecified: Secondary | ICD-10-CM

## 2024-05-05 DIAGNOSIS — Z79899 Other long term (current) drug therapy: Secondary | ICD-10-CM

## 2024-05-05 DIAGNOSIS — R760 Raised antibody titer: Secondary | ICD-10-CM

## 2024-05-06 LAB — PROTEIN / CREATININE RATIO, URINE
Creatinine, Urine: 243 mg/dL (ref 20–275)
Protein/Creat Ratio: 49 mg/g{creat} (ref 24–184)
Protein/Creatinine Ratio: 0.049 mg/mg{creat} (ref 0.024–0.184)
Total Protein, Urine: 12 mg/dL (ref 5–24)

## 2024-05-09 LAB — CBC WITH DIFFERENTIAL/PLATELET
Absolute Lymphocytes: 1459 {cells}/uL (ref 850–3900)
Absolute Monocytes: 342 {cells}/uL (ref 200–950)
Basophils Absolute: 31 {cells}/uL (ref 0–200)
Basophils Relative: 0.6 %
Eosinophils Absolute: 92 {cells}/uL (ref 15–500)
Eosinophils Relative: 1.8 %
HCT: 38.4 % (ref 35.0–45.0)
Hemoglobin: 12.6 g/dL (ref 11.7–15.5)
MCH: 28.1 pg (ref 27.0–33.0)
MCHC: 32.8 g/dL (ref 32.0–36.0)
MCV: 85.7 fL (ref 80.0–100.0)
MPV: 11.1 fL (ref 7.5–12.5)
Monocytes Relative: 6.7 %
Neutro Abs: 3177 {cells}/uL (ref 1500–7800)
Neutrophils Relative %: 62.3 %
Platelets: 274 Thousand/uL (ref 140–400)
RBC: 4.48 Million/uL (ref 3.80–5.10)
RDW: 12.6 % (ref 11.0–15.0)
Total Lymphocyte: 28.6 %
WBC: 5.1 Thousand/uL (ref 3.8–10.8)

## 2024-05-09 LAB — C3 AND C4
C3 Complement: 111 mg/dL (ref 83–193)
C4 Complement: 14 mg/dL — ABNORMAL LOW (ref 15–57)

## 2024-05-09 LAB — RFLX HEXAGONAL PHASE CONFIRM: Hexagonal Phase Conf: POSITIVE — AB

## 2024-05-09 LAB — RFLX DRVVT CONFRIM: DRVVT CONFIRM: POSITIVE — AB

## 2024-05-09 LAB — BETA-2 GLYCOPROTEIN ANTIBODIES
Beta-2 Glyco 1 IgA: <2 U/mL (ref <20.0)
Beta-2 Glyco 1 IgM: 18.3 U/mL (ref <20.0)
Beta-2 Glyco I IgG: <2 U/mL (ref <20.0)

## 2024-05-09 LAB — THROMBIN CLOTTING TIME: Thrombin Clotting Time: 17 s (ref 13–19)

## 2024-05-09 LAB — RFX DRVVT 1:1 MIX

## 2024-05-09 LAB — COMPREHENSIVE METABOLIC PANEL WITH GFR
AG Ratio: 1.9 (calc) (ref 1.0–2.5)
ALT: 14 U/L (ref 6–29)
AST: 20 U/L (ref 10–35)
Albumin: 4.4 g/dL (ref 3.6–5.1)
Alkaline phosphatase (APISO): 39 U/L (ref 37–153)
BUN: 12 mg/dL (ref 7–25)
CO2: 27 mmol/L (ref 20–32)
Calcium: 9.7 mg/dL (ref 8.6–10.4)
Chloride: 105 mmol/L (ref 98–110)
Creat: 0.8 mg/dL (ref 0.50–1.05)
Globulin: 2.3 g/dL (ref 1.9–3.7)
Glucose, Bld: 77 mg/dL (ref 65–99)
Potassium: 4.5 mmol/L (ref 3.5–5.3)
Sodium: 141 mmol/L (ref 135–146)
Total Bilirubin: 0.6 mg/dL (ref 0.2–1.2)
Total Protein: 6.7 g/dL (ref 6.1–8.1)
eGFR: 84 mL/min/1.73m2 (ref >=60)

## 2024-05-09 LAB — LUPUS ANTICOAGULANT EVAL W/ REFLEX
PTT-LA Screen: 45 s — ABNORMAL HIGH (ref <=40)
dRVVT: 52 s — ABNORMAL HIGH (ref <=45)

## 2024-05-09 LAB — CARDIOLIPIN ANTIBODIES, IGG, IGM, IGA
Anticardiolipin IgA: <2 [APL'U]/mL (ref <20.0)
Anticardiolipin IgG: <2 [GPL'U]/mL (ref <20.0)
Anticardiolipin IgM: 15.4 [MPL'U]/mL (ref <20.0)

## 2024-05-09 LAB — ANTI-DNA ANTIBODY, DOUBLE-STRANDED: ds DNA Ab: 1 [IU]/mL

## 2024-05-09 LAB — SEDIMENTATION RATE: Sed Rate: 9 mm/h (ref 0–30)

## 2024-05-10 ENCOUNTER — Ambulatory Visit: Payer: Self-pay | Admitting: Physician Assistant

## 2024-05-11 ENCOUNTER — Encounter: Payer: Self-pay | Admitting: Physician Assistant

## 2024-05-11 ENCOUNTER — Ambulatory Visit: Attending: Physician Assistant | Admitting: Physician Assistant

## 2024-05-11 ENCOUNTER — Other Ambulatory Visit: Payer: Self-pay

## 2024-05-11 ENCOUNTER — Other Ambulatory Visit (HOSPITAL_BASED_OUTPATIENT_CLINIC_OR_DEPARTMENT_OTHER): Payer: Self-pay

## 2024-05-11 VITALS — BP 164/85 | HR 55 | Temp 97.9°F | Resp 14 | Ht 66.0 in | Wt 142.2 lb

## 2024-05-11 DIAGNOSIS — M19072 Primary osteoarthritis, left ankle and foot: Secondary | ICD-10-CM

## 2024-05-11 DIAGNOSIS — D219 Benign neoplasm of connective and other soft tissue, unspecified: Secondary | ICD-10-CM

## 2024-05-11 DIAGNOSIS — R760 Raised antibody titer: Secondary | ICD-10-CM

## 2024-05-11 DIAGNOSIS — I7300 Raynaud's syndrome without gangrene: Secondary | ICD-10-CM | POA: Diagnosis not present

## 2024-05-11 DIAGNOSIS — M359 Systemic involvement of connective tissue, unspecified: Secondary | ICD-10-CM | POA: Diagnosis not present

## 2024-05-11 DIAGNOSIS — M7061 Trochanteric bursitis, right hip: Secondary | ICD-10-CM

## 2024-05-11 DIAGNOSIS — M170 Bilateral primary osteoarthritis of knee: Secondary | ICD-10-CM

## 2024-05-11 DIAGNOSIS — M19071 Primary osteoarthritis, right ankle and foot: Secondary | ICD-10-CM

## 2024-05-11 DIAGNOSIS — M5030 Other cervical disc degeneration, unspecified cervical region: Secondary | ICD-10-CM

## 2024-05-11 DIAGNOSIS — Z79899 Other long term (current) drug therapy: Secondary | ICD-10-CM

## 2024-05-11 DIAGNOSIS — H9313 Tinnitus, bilateral: Secondary | ICD-10-CM

## 2024-05-11 DIAGNOSIS — Z8669 Personal history of other diseases of the nervous system and sense organs: Secondary | ICD-10-CM

## 2024-05-11 DIAGNOSIS — M19042 Primary osteoarthritis, left hand: Secondary | ICD-10-CM

## 2024-05-11 DIAGNOSIS — Z860100 Personal history of colon polyps, unspecified: Secondary | ICD-10-CM

## 2024-05-11 DIAGNOSIS — M51369 Other intervertebral disc degeneration, lumbar region without mention of lumbar back pain or lower extremity pain: Secondary | ICD-10-CM

## 2024-05-11 DIAGNOSIS — M8589 Other specified disorders of bone density and structure, multiple sites: Secondary | ICD-10-CM

## 2024-05-11 DIAGNOSIS — E785 Hyperlipidemia, unspecified: Secondary | ICD-10-CM

## 2024-05-11 DIAGNOSIS — M19041 Primary osteoarthritis, right hand: Secondary | ICD-10-CM

## 2024-05-11 DIAGNOSIS — E559 Vitamin D deficiency, unspecified: Secondary | ICD-10-CM

## 2024-05-11 MED ORDER — PREDNISONE 5 MG PO TABS
ORAL_TABLET | ORAL | 0 refills | Status: AC
  Filled 2024-05-11: qty 10, 8d supply, fill #0

## 2024-05-11 NOTE — Telephone Encounter (Signed)
 Please review and sign pended prednisone taper that you advised at patient's appointment today. Thanks!

## 2024-06-04 ENCOUNTER — Ambulatory Visit (HOSPITAL_BASED_OUTPATIENT_CLINIC_OR_DEPARTMENT_OTHER): Admitting: Obstetrics & Gynecology

## 2024-06-11 ENCOUNTER — Ambulatory Visit: Admitting: Physician Assistant

## 2024-06-21 ENCOUNTER — Other Ambulatory Visit (HOSPITAL_BASED_OUTPATIENT_CLINIC_OR_DEPARTMENT_OTHER): Payer: Self-pay

## 2024-06-21 ENCOUNTER — Encounter (HOSPITAL_BASED_OUTPATIENT_CLINIC_OR_DEPARTMENT_OTHER): Payer: Self-pay | Admitting: Obstetrics & Gynecology

## 2024-06-21 ENCOUNTER — Ambulatory Visit (HOSPITAL_BASED_OUTPATIENT_CLINIC_OR_DEPARTMENT_OTHER): Payer: Self-pay | Admitting: Obstetrics & Gynecology

## 2024-06-21 VITALS — Ht 66.0 in | Wt 142.2 lb

## 2024-06-21 DIAGNOSIS — M359 Systemic involvement of connective tissue, unspecified: Secondary | ICD-10-CM | POA: Diagnosis not present

## 2024-06-21 DIAGNOSIS — Z01419 Encounter for gynecological examination (general) (routine) without abnormal findings: Secondary | ICD-10-CM | POA: Diagnosis not present

## 2024-06-21 DIAGNOSIS — Z1231 Encounter for screening mammogram for malignant neoplasm of breast: Secondary | ICD-10-CM | POA: Diagnosis not present

## 2024-06-21 DIAGNOSIS — Z1331 Encounter for screening for depression: Secondary | ICD-10-CM | POA: Diagnosis not present

## 2024-06-21 DIAGNOSIS — Z79890 Hormone replacement therapy (postmenopausal): Secondary | ICD-10-CM

## 2024-06-21 DIAGNOSIS — M8588 Other specified disorders of bone density and structure, other site: Secondary | ICD-10-CM

## 2024-06-21 MED ORDER — ESTRADIOL 1 MG PO TABS
1.0000 mg | ORAL_TABLET | Freq: Every day | ORAL | 4 refills | Status: AC
  Filled 2024-06-21: qty 30, 30d supply, fill #0
  Filled 2024-07-22: qty 30, 30d supply, fill #1

## 2024-06-21 NOTE — Progress Notes (Signed)
 "  ANNUAL EXAM Patient name: Alexandra Garner MRN [id number]  Date of birth: [birth year] Chief Complaint:   Gynecologic Exam  History of Present Illness:   Alexandra Garner is a 63 y.o. G1P1 Caucasian female being seen today for a routine annual exam.  Patient would like to discuss possibly changing Premarin  today to Estradiol .  Premarin  has gotten very costly.  Will try 1mg  estradiol .  Denies vaginal bleeding.    Patient's last menstrual period was 06/25/2003.   Last pap: Hysterectomy.  H/O abnormal pap: no Last mammogram: 07/03/2023. Results were: normal. Family h/o breast cancer: no Last colonoscopy: 12/18/2018. Results were: normal. Family h/o colorectal cancer: no Dexa: 2022.  Osteopenia in spine.       06/21/2024    2:15 PM 05/14/2023    2:44 PM 01/22/2023   11:29 AM 08/27/2022   11:15 AM 05/02/2022    8:24 AM  Depression screen PHQ 2/9  Decreased Interest 0 0 0 0 0  Down, Depressed, Hopeless 0 0 0 0 0  PHQ - 2 Score 0 0 0 0 0        01/22/2023   11:30 AM  GAD 7 : Generalized Anxiety Score  Nervous, Anxious, on Edge 0  Control/stop worrying 0  Worry too much - different things 0  Trouble relaxing 0  Restless 0  Easily annoyed or irritable 0  Afraid - awful might happen 0  Total GAD 7 Score 0  Anxiety Difficulty Not difficult at all     Review of Systems:   Pertinent items are noted in HPI Denies any headaches, blurred vision, fatigue, shortness of breath, chest pain, abdominal pain, abnormal vaginal discharge/itching/odor/irritation, problems with periods, bowel movements, urination, or intercourse unless otherwise stated above. Pertinent History Reviewed:  Reviewed past medical,surgical, social and family history.  Reviewed problem list, medications and allergies. Physical Assessment:   Vitals:   06/21/24 1409  Weight: 142 lb 3.2 oz (64.5 kg)  Height: 5' 6 (1.676 m)  Body mass index is 22.95 kg/m.        Physical Examination:   General appearance - well  appearing, and in no distress  Mental status - alert, oriented to person, place, and time  Psych:  She has a normal mood and affect  Skin - warm and dry, normal color, no suspicious lesions noted  Chest - effort normal, all lung fields clear to auscultation bilaterally  Heart - normal rate and regular rhythm  Neck:  midline trachea, no thyromegaly or nodules  Breasts - breasts appear normal, no suspicious masses, no skin or nipple changes or  axillary nodes  Abdomen - soft, nontender, nondistended, no masses or organomegaly  Pelvic - VULVA: normal appearing vulva with no masses, tenderness or lesions   VAGINA: normal appearing vagina with normal color and discharge, no lesions   CERVIX: surgically absent  Thin prep pap is not indicated  UTERUS: surgically absent  ADNEXA: No adnexal masses or tenderness noted.  Rectal - normal rectal, good sphincter tone, no masses felt  Extremities:  No swelling or varicosities noted  Chaperone present for exam  No results found for this or any previous visit (from the past 24 hours).  Assessment & Plan:  1. Well woman exam with routine gynecological exam (Primary) - Pap smear not indicated - Mammogram 06/2022 - Colonoscopy 2020.  Follow up 10 years. - Bone mineral density 2022. - lab work done with PCP, Dr. Lavern - vaccines reviewed/updated  2. Hormone replacement therapy (HRT) -  will switch to estradiol  - estradiol  (ESTRACE ) 1 MG tablet; Take 1 tablet (1 mg total) by mouth daily.  Dispense: 90 tablet; Refill: 4  3. Osteopenia of lumbar spine - will repeat this year - DG Bone Density; Future  4. Encounter for screening mammogram for malignant neoplasm of breast - MM 3D SCREENING MAMMOGRAM BILATERAL BREAST; Future  5. Autoimmune disease - on hydrochloroquine and followed by Dr. Dolphus   Orders Placed This Encounter  Procedures   DG Bone Density   MM 3D SCREENING MAMMOGRAM BILATERAL BREAST    Meds:  Meds ordered this encounter   Medications   estradiol  (ESTRACE ) 1 MG tablet    Sig: Take 1 tablet (1 mg total) by mouth daily.    Dispense:  90 tablet    Refill:  4    Follow-up: Return in about 1 year (around 06/21/2025).  Ronal GORMAN Pinal, MD 06/21/2024 3:07 PM "

## 2024-06-30 ENCOUNTER — Other Ambulatory Visit (HOSPITAL_BASED_OUTPATIENT_CLINIC_OR_DEPARTMENT_OTHER): Payer: Self-pay

## 2024-06-30 ENCOUNTER — Other Ambulatory Visit: Payer: Self-pay | Admitting: Rheumatology

## 2024-06-30 DIAGNOSIS — M359 Systemic involvement of connective tissue, unspecified: Secondary | ICD-10-CM

## 2024-06-30 MED ORDER — HYDROXYCHLOROQUINE SULFATE 200 MG PO TABS
200.0000 mg | ORAL_TABLET | Freq: Every day | ORAL | 0 refills | Status: AC
  Filled 2024-06-30: qty 30, 30d supply, fill #0
  Filled 2024-07-22 – 2024-07-23 (×3): qty 30, 30d supply, fill #1

## 2024-06-30 NOTE — Telephone Encounter (Signed)
 Last Fill: 04/08/2024  Eye exam: 03/02/2024   Labs: 05/05/2024 CBC and CMP WNL   Next Visit: 08/11/2024  Last Visit: 05/11/2024  IK:Jlunpfflwz disease   Current Dose per office note on 05/11/2024: Plaquenil  200 mg 1 tablet by mouth daily   Okay to refill Plaquenil ?

## 2024-07-02 ENCOUNTER — Encounter (HOSPITAL_BASED_OUTPATIENT_CLINIC_OR_DEPARTMENT_OTHER): Payer: Self-pay | Admitting: Obstetrics & Gynecology

## 2024-07-02 ENCOUNTER — Other Ambulatory Visit (HOSPITAL_BASED_OUTPATIENT_CLINIC_OR_DEPARTMENT_OTHER): Payer: Self-pay

## 2024-07-02 MED ORDER — ONDANSETRON HCL 4 MG PO TABS
4.0000 mg | ORAL_TABLET | Freq: Three times a day (TID) | ORAL | 0 refills | Status: AC | PRN
  Filled 2024-07-02: qty 20, 7d supply, fill #0

## 2024-07-22 ENCOUNTER — Other Ambulatory Visit (HOSPITAL_BASED_OUTPATIENT_CLINIC_OR_DEPARTMENT_OTHER): Payer: Self-pay

## 2024-07-23 ENCOUNTER — Other Ambulatory Visit: Payer: Self-pay

## 2024-07-23 ENCOUNTER — Other Ambulatory Visit (HOSPITAL_BASED_OUTPATIENT_CLINIC_OR_DEPARTMENT_OTHER): Payer: Self-pay

## 2024-07-28 NOTE — Progress Notes (Unsigned)
 "  Office Visit Note  Patient: Alexandra Garner             Date of Birth: 26-Dec-1960           MRN: [id number]             PCP: Jodie Lavern CROME, MD Referring: Jodie Lavern CROME, MD Visit Date: 08/11/2024 Occupation: Data Unavailable  Subjective:  No chief complaint on file.   History of Present Illness: Alexandra Garner is a 64 y.o. female ***     Activities of Daily Living:  Patient reports morning stiffness for *** {minute/hour:19697}.   Patient {ACTIONS;DENIES/REPORTS:21021675::Denies} nocturnal pain.  Difficulty dressing/grooming: {ACTIONS;DENIES/REPORTS:21021675::Denies} Difficulty climbing stairs: {ACTIONS;DENIES/REPORTS:21021675::Denies} Difficulty getting out of chair: {ACTIONS;DENIES/REPORTS:21021675::Denies} Difficulty using hands for taps, buttons, cutlery, and/or writing: {ACTIONS;DENIES/REPORTS:21021675::Denies}  No Rheumatology ROS completed.   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   Family history of macular degeneration 02/03/2015   Primary localized osteoarthrosis of hand 12/23/2013   History of colonic polyps 11/01/2008    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   Autoimmune disease    in the Lupus family   Fibroid    Hand pain    left hand-had 2 cortisone injections   Hx of migraines    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 Montie Cable   COLONOSCOPY  03/28/2008   PELVIC LAPAROSCOPY     REDUCTION MAMMAPLASTY     Social History[1] Social History   Social History Narrative   Not on file     Immunization History  Administered Date(s) Administered   PFIZER(Purple Top)SARS-COV-2 Vaccination 07/20/2019, 08/11/2019, 03/30/2020, 10/30/2020, 04/18/2022   Pfizer Covid-19 Vaccine Bivalent Booster 68yrs & up 04/01/2021   Pfizer(Comirnaty)Fall Seasonal Vaccine 12 years and older 04/06/2022   Tdap 10/01/2011, 05/02/2022   Zoster Recombinant(Shingrix ) 04/15/2019, 06/17/2019     Objective: Vital Signs: LMP 06/25/2003    Physical Exam   Musculoskeletal Exam: ***  CDAI Exam: CDAI Score: -- Patient Global: --; Provider Global: -- Swollen: --; Tender: -- Joint Exam 08/11/2024   No joint exam has been documented for this visit   There is currently no information documented on the homunculus. Go to the Rheumatology activity and complete the homunculus joint exam.  Investigation: No additional findings.  Imaging: No results found.  Recent Labs: Lab Results  Component Value Date   WBC 5.1 05/05/2024   HGB 12.6 05/05/2024   PLT 274 05/05/2024   NA 141 05/05/2024   K 4.5 05/05/2024   CL 105  05/05/2024   CO2 27 05/05/2024   GLUCOSE 77 05/05/2024   BUN 12 05/05/2024   CREATININE 0.80 05/05/2024   BILITOT 0.6 05/05/2024   ALKPHOS 33 (L) 01/10/2020   AST 20 05/05/2024   ALT 14 05/05/2024   PROT 6.7 05/05/2024   ALBUMIN 3.9 01/10/2020   CALCIUM 9.7 05/05/2024   GFRAA 99 08/29/2020    Speciality Comments: Plaquenil  eye exam: normal on 03/02/2024 [facility]  Follow up in 1 year.   Procedures:  No procedures performed Allergies: Penicillins, Doxycycline , Adhesive [tape], and Latex   Assessment / Plan:     Visit Diagnoses: No diagnosis found.  Orders: No orders of the defined types were placed in this encounter.  No orders of the defined types were placed in this encounter.   Face-to-face time spent with patient was *** minutes. Greater than 50% of time was spent in counseling and coordination of care.  Follow-Up Instructions: No follow-ups on file.   Waddell CHRISTELLA Craze, PA-C  Note - This record has been created using Dragon software.  Chart creation errors have been sought, but may not always  have been located. Such creation errors do not reflect on  the standard of medical care.     [1]  Social History Tobacco Use   Smoking status: Never    Passive exposure: Never   Smokeless tobacco: Never  Vaping Use   Vaping status: Never Used  Substance Use Topics   Alcohol use: Yes    Alcohol/week: 2.0 standard drinks of alcohol    Types: 2 Standard drinks or equivalent per week    Comment: occ   Drug use: No   "

## 2024-08-11 ENCOUNTER — Ambulatory Visit: Admitting: Physician Assistant

## 2024-08-11 DIAGNOSIS — I7300 Raynaud's syndrome without gangrene: Secondary | ICD-10-CM

## 2024-08-11 DIAGNOSIS — M359 Systemic involvement of connective tissue, unspecified: Secondary | ICD-10-CM

## 2024-08-11 DIAGNOSIS — Z8669 Personal history of other diseases of the nervous system and sense organs: Secondary | ICD-10-CM

## 2024-08-11 DIAGNOSIS — H9313 Tinnitus, bilateral: Secondary | ICD-10-CM

## 2024-08-11 DIAGNOSIS — M7061 Trochanteric bursitis, right hip: Secondary | ICD-10-CM

## 2024-08-11 DIAGNOSIS — M19072 Primary osteoarthritis, left ankle and foot: Secondary | ICD-10-CM

## 2024-08-11 DIAGNOSIS — M5030 Other cervical disc degeneration, unspecified cervical region: Secondary | ICD-10-CM

## 2024-08-11 DIAGNOSIS — M19041 Primary osteoarthritis, right hand: Secondary | ICD-10-CM

## 2024-08-11 DIAGNOSIS — R760 Raised antibody titer: Secondary | ICD-10-CM

## 2024-08-11 DIAGNOSIS — D219 Benign neoplasm of connective and other soft tissue, unspecified: Secondary | ICD-10-CM

## 2024-08-11 DIAGNOSIS — M51369 Other intervertebral disc degeneration, lumbar region without mention of lumbar back pain or lower extremity pain: Secondary | ICD-10-CM

## 2024-08-11 DIAGNOSIS — E559 Vitamin D deficiency, unspecified: Secondary | ICD-10-CM

## 2024-08-11 DIAGNOSIS — M8589 Other specified disorders of bone density and structure, multiple sites: Secondary | ICD-10-CM

## 2024-08-11 DIAGNOSIS — Z860100 Personal history of colon polyps, unspecified: Secondary | ICD-10-CM

## 2024-08-11 DIAGNOSIS — E785 Hyperlipidemia, unspecified: Secondary | ICD-10-CM

## 2024-08-11 DIAGNOSIS — M170 Bilateral primary osteoarthritis of knee: Secondary | ICD-10-CM

## 2024-08-11 DIAGNOSIS — Z79899 Other long term (current) drug therapy: Secondary | ICD-10-CM

## 2024-08-17 ENCOUNTER — Other Ambulatory Visit (HOSPITAL_BASED_OUTPATIENT_CLINIC_OR_DEPARTMENT_OTHER)

## 2024-08-17 ENCOUNTER — Ambulatory Visit (HOSPITAL_BASED_OUTPATIENT_CLINIC_OR_DEPARTMENT_OTHER)

## 2024-09-17 ENCOUNTER — Ambulatory Visit (HOSPITAL_BASED_OUTPATIENT_CLINIC_OR_DEPARTMENT_OTHER): Admitting: Obstetrics & Gynecology

## 2024-09-28 ENCOUNTER — Encounter: Admitting: Family Medicine
